# Patient Record
Sex: Female | Born: 1990
Health system: Southern US, Community
[De-identification: ages and names within clinical notes are randomized; demographics above are authoritative.]

## PROBLEM LIST (undated history)

## (undated) ENCOUNTER — Inpatient Hospital Stay (HOSPITAL_COMMUNITY): Payer: Self-pay

## (undated) DIAGNOSIS — Z21 Asymptomatic human immunodeficiency virus [HIV] infection status: Secondary | ICD-10-CM

## (undated) DIAGNOSIS — IMO0002 Reserved for concepts with insufficient information to code with codable children: Secondary | ICD-10-CM

## (undated) DIAGNOSIS — D6862 Lupus anticoagulant syndrome: Secondary | ICD-10-CM

## (undated) DIAGNOSIS — F329 Major depressive disorder, single episode, unspecified: Secondary | ICD-10-CM

## (undated) DIAGNOSIS — F909 Attention-deficit hyperactivity disorder, unspecified type: Secondary | ICD-10-CM

## (undated) DIAGNOSIS — R0602 Shortness of breath: Secondary | ICD-10-CM

## (undated) DIAGNOSIS — Z7901 Long term (current) use of anticoagulants: Secondary | ICD-10-CM

## (undated) DIAGNOSIS — R05 Cough: Secondary | ICD-10-CM

## (undated) DIAGNOSIS — L240 Irritant contact dermatitis due to detergents: Secondary | ICD-10-CM

## (undated) DIAGNOSIS — F419 Anxiety disorder, unspecified: Secondary | ICD-10-CM

## (undated) DIAGNOSIS — F32A Depression, unspecified: Secondary | ICD-10-CM

## (undated) DIAGNOSIS — Z720 Tobacco use: Secondary | ICD-10-CM

## (undated) DIAGNOSIS — R87619 Unspecified abnormal cytological findings in specimens from cervix uteri: Secondary | ICD-10-CM

## (undated) DIAGNOSIS — F319 Bipolar disorder, unspecified: Secondary | ICD-10-CM

## (undated) DIAGNOSIS — D6861 Antiphospholipid syndrome: Secondary | ICD-10-CM

## (undated) DIAGNOSIS — R058 Other specified cough: Secondary | ICD-10-CM

## (undated) DIAGNOSIS — F1910 Other psychoactive substance abuse, uncomplicated: Secondary | ICD-10-CM

## (undated) DIAGNOSIS — D649 Anemia, unspecified: Secondary | ICD-10-CM

## (undated) DIAGNOSIS — K219 Gastro-esophageal reflux disease without esophagitis: Secondary | ICD-10-CM

## (undated) DIAGNOSIS — I2699 Other pulmonary embolism without acute cor pulmonale: Secondary | ICD-10-CM

## (undated) DIAGNOSIS — O1200 Gestational edema, unspecified trimester: Secondary | ICD-10-CM

## (undated) DIAGNOSIS — J069 Acute upper respiratory infection, unspecified: Secondary | ICD-10-CM

## (undated) DIAGNOSIS — F410 Panic disorder [episodic paroxysmal anxiety] without agoraphobia: Secondary | ICD-10-CM

## (undated) HISTORY — DX: Tobacco use: Z72.0

## (undated) HISTORY — DX: Long term (current) use of anticoagulants: Z79.01

## (undated) HISTORY — DX: Other psychoactive substance abuse, uncomplicated: F19.10

## (undated) HISTORY — DX: Anemia, unspecified: D64.9

## (undated) HISTORY — DX: Anxiety disorder, unspecified: F41.9

## (undated) HISTORY — DX: Attention-deficit hyperactivity disorder, unspecified type: F90.9

## (undated) HISTORY — DX: Depression, unspecified: F32.A

## (undated) HISTORY — DX: Asymptomatic human immunodeficiency virus (hiv) infection status: Z21

## (undated) HISTORY — DX: Major depressive disorder, single episode, unspecified: F32.9

## (undated) HISTORY — DX: Antiphospholipid syndrome: D68.61

## (undated) HISTORY — DX: Other pulmonary embolism without acute cor pulmonale: I26.99

## (undated) HISTORY — DX: Lupus anticoagulant syndrome: D68.62

---
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
## 2008-01-07 ENCOUNTER — Emergency Department (HOSPITAL_COMMUNITY): Admission: EM | Admit: 2008-01-07 | Discharge: 2008-01-07 | Payer: Self-pay | Admitting: Emergency Medicine

## 2008-01-08 ENCOUNTER — Emergency Department (HOSPITAL_COMMUNITY): Admission: EM | Admit: 2008-01-08 | Discharge: 2008-01-08 | Payer: Self-pay | Admitting: Emergency Medicine

## 2008-01-16 ENCOUNTER — Emergency Department (HOSPITAL_COMMUNITY): Admission: EM | Admit: 2008-01-16 | Discharge: 2008-01-16 | Payer: Self-pay | Admitting: Emergency Medicine

## 2008-07-26 ENCOUNTER — Emergency Department (HOSPITAL_COMMUNITY): Admission: EM | Admit: 2008-07-26 | Discharge: 2008-07-26 | Payer: Self-pay | Admitting: Emergency Medicine

## 2008-08-29 ENCOUNTER — Emergency Department (HOSPITAL_COMMUNITY): Admission: EM | Admit: 2008-08-29 | Discharge: 2008-08-29 | Payer: Self-pay | Admitting: Emergency Medicine

## 2008-09-17 ENCOUNTER — Inpatient Hospital Stay (HOSPITAL_COMMUNITY): Admission: AD | Admit: 2008-09-17 | Discharge: 2008-09-22 | Payer: Self-pay | Admitting: Family Medicine

## 2008-09-17 ENCOUNTER — Other Ambulatory Visit: Payer: Self-pay | Admitting: Emergency Medicine

## 2008-09-17 ENCOUNTER — Ambulatory Visit: Payer: Self-pay | Admitting: Obstetrics & Gynecology

## 2009-02-08 ENCOUNTER — Emergency Department (HOSPITAL_COMMUNITY): Admission: EM | Admit: 2009-02-08 | Discharge: 2009-02-08 | Payer: Self-pay | Admitting: Emergency Medicine

## 2009-02-16 ENCOUNTER — Emergency Department (HOSPITAL_COMMUNITY): Admission: EM | Admit: 2009-02-16 | Discharge: 2009-02-16 | Payer: Self-pay | Admitting: Emergency Medicine

## 2009-04-12 ENCOUNTER — Emergency Department (HOSPITAL_COMMUNITY): Admission: EM | Admit: 2009-04-12 | Discharge: 2009-04-12 | Payer: Self-pay | Admitting: Emergency Medicine

## 2009-04-29 ENCOUNTER — Emergency Department (HOSPITAL_COMMUNITY): Admission: EM | Admit: 2009-04-29 | Discharge: 2009-04-30 | Payer: Self-pay | Admitting: Emergency Medicine

## 2009-05-10 ENCOUNTER — Emergency Department (HOSPITAL_COMMUNITY): Admission: EM | Admit: 2009-05-10 | Discharge: 2009-05-10 | Payer: Self-pay | Admitting: Emergency Medicine

## 2009-05-18 IMAGING — US US OB COMP +14 WK
1 series · 14 of 28 positions shown · non-contrast
Comparison: none

OBSTETRICAL ULTRASOUND:
 This ultrasound exam was performed in the [HOSPITAL] Ultrasound Department.  The OB US report was generated in the AS system, and faxed to the ordering physician.  This report is also available in [REDACTED] PACS.

[Series 1: us ob comp +14 wk · 39 acquisitions, 14 frames shown]
[im 2/39]
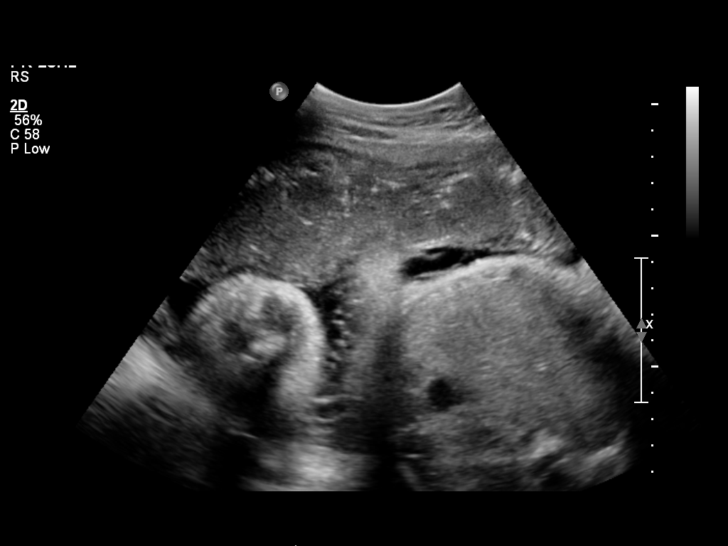
[im 5/39]
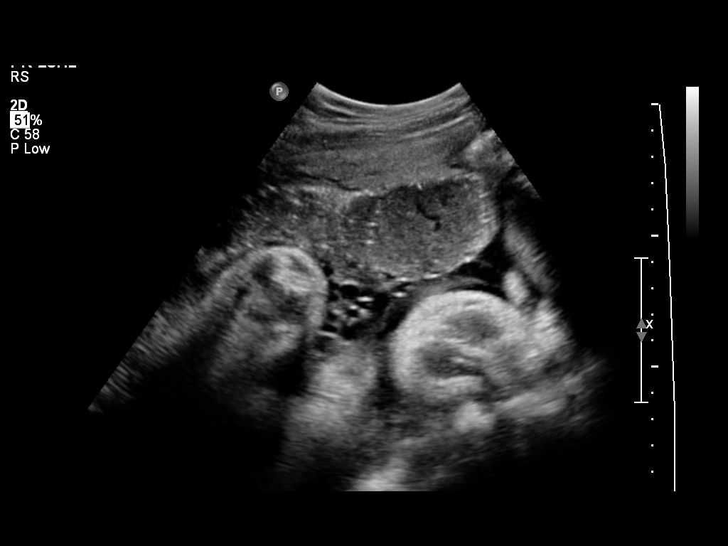
[im 8/39]
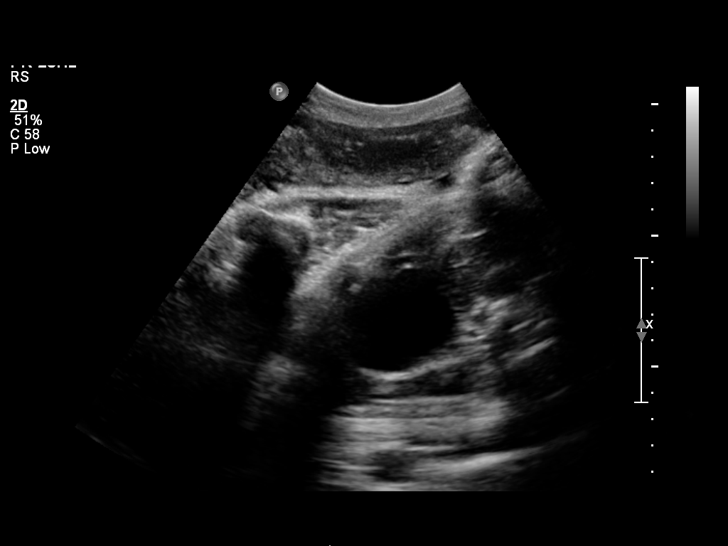
[im 10/39]
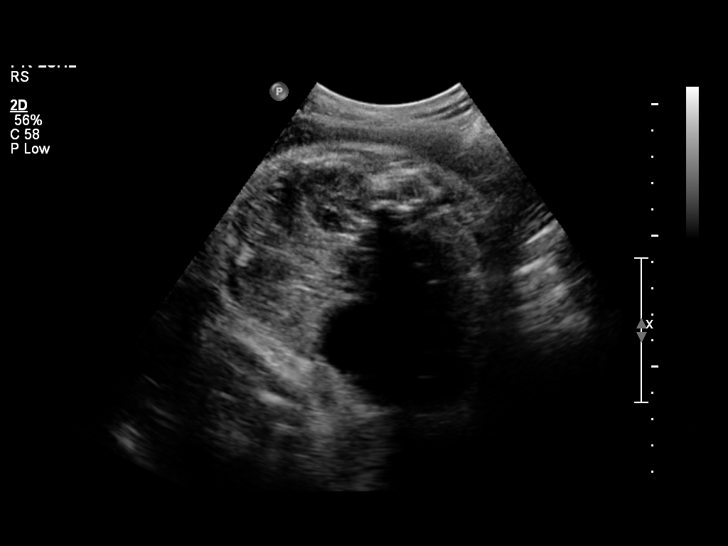
[im 13/39]
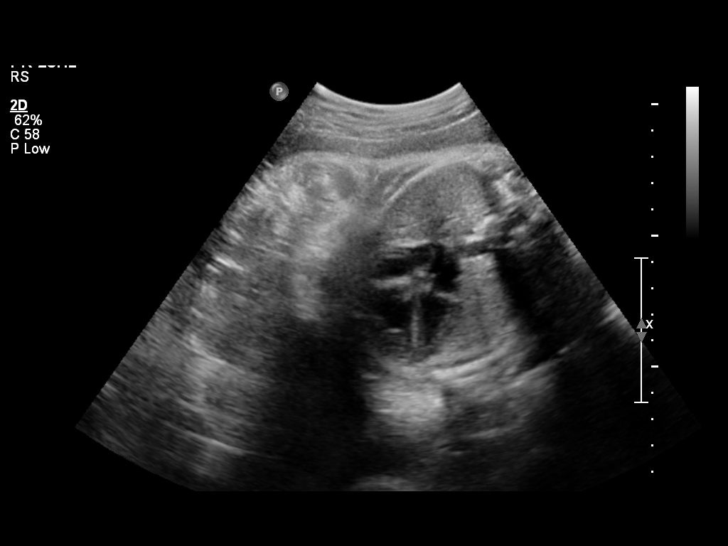
[im 16/39]
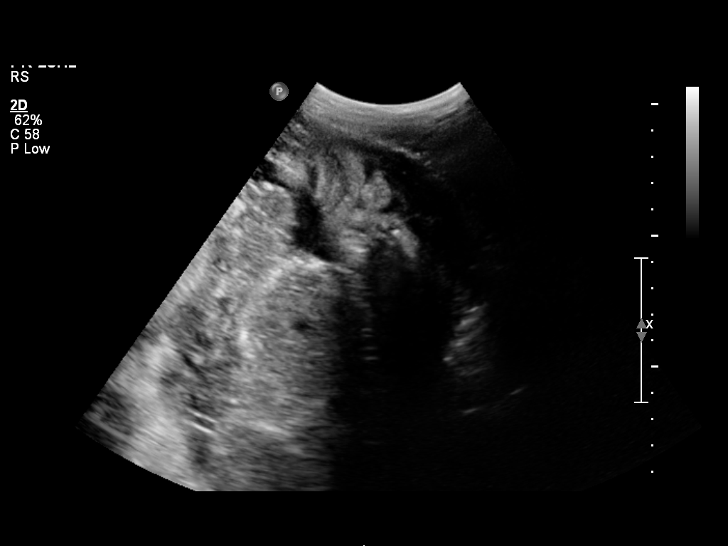
[im 19/39]
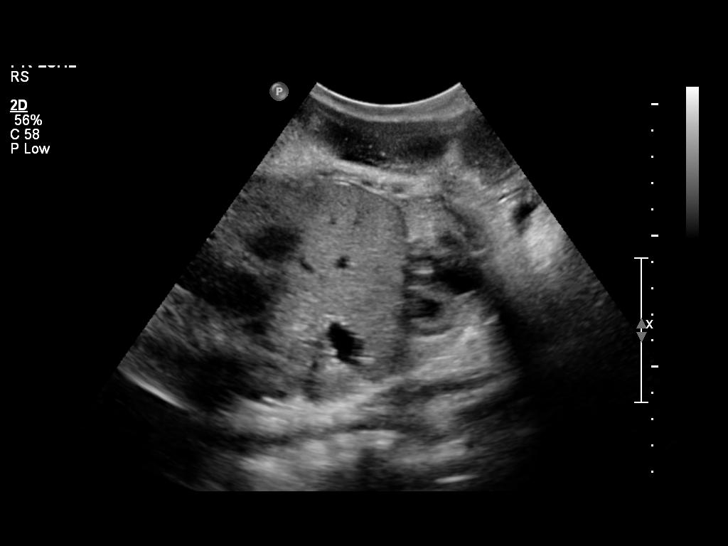
[im 22/39]
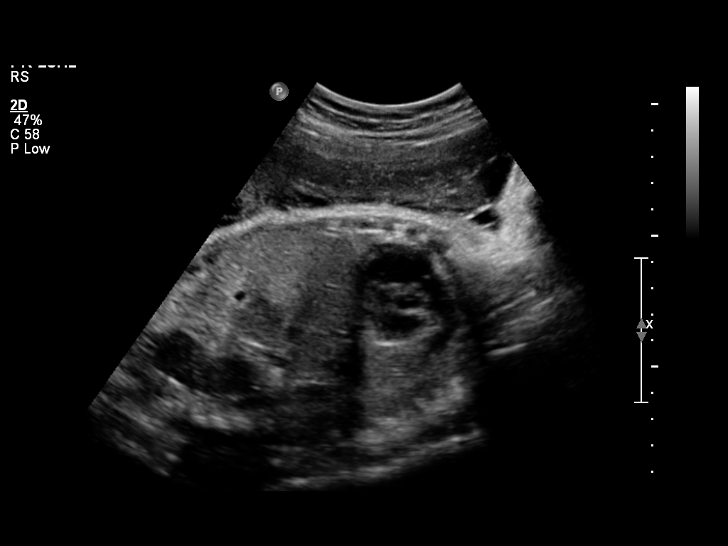
[im 24/39]
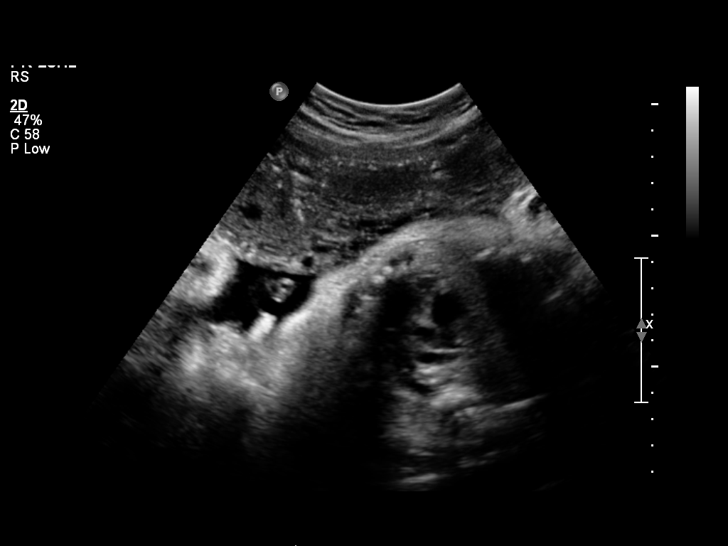
[im 27/39]
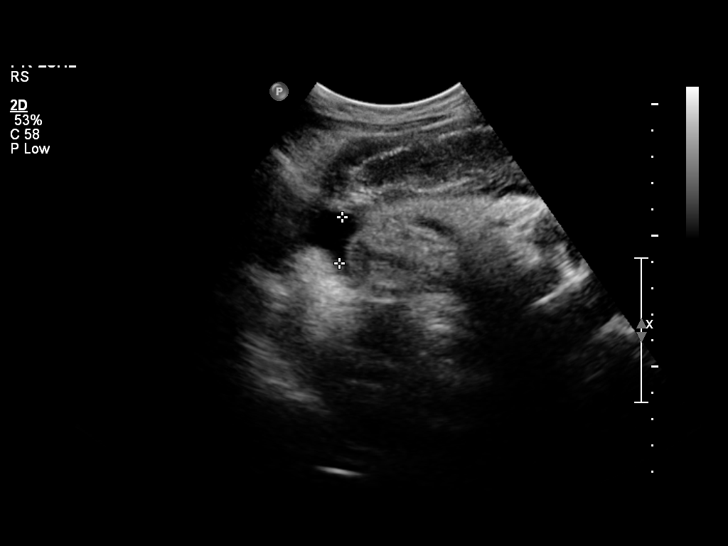
[im 30/39]
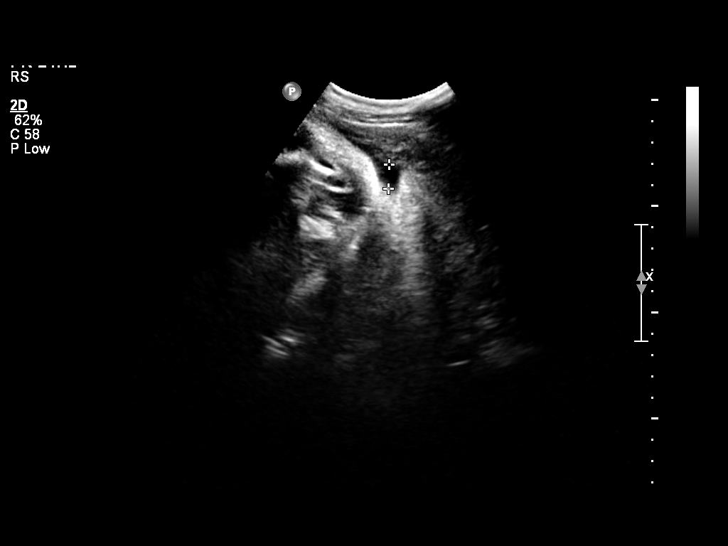
[im 33/39]
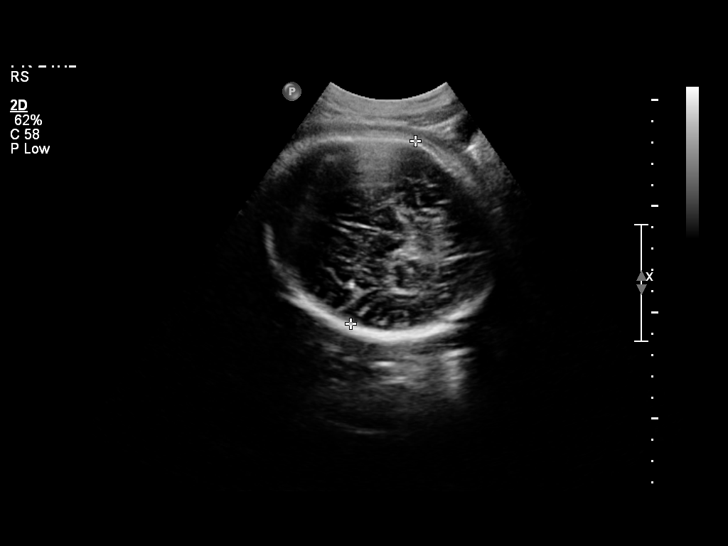
[im 36/39]
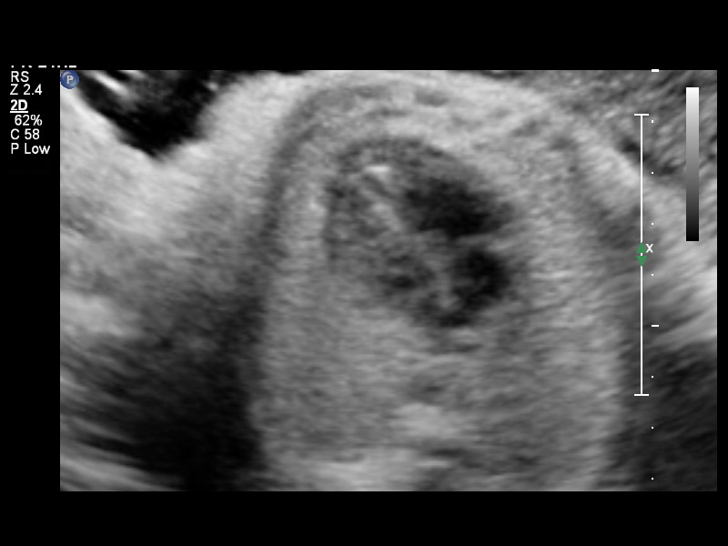
[im 39/39]
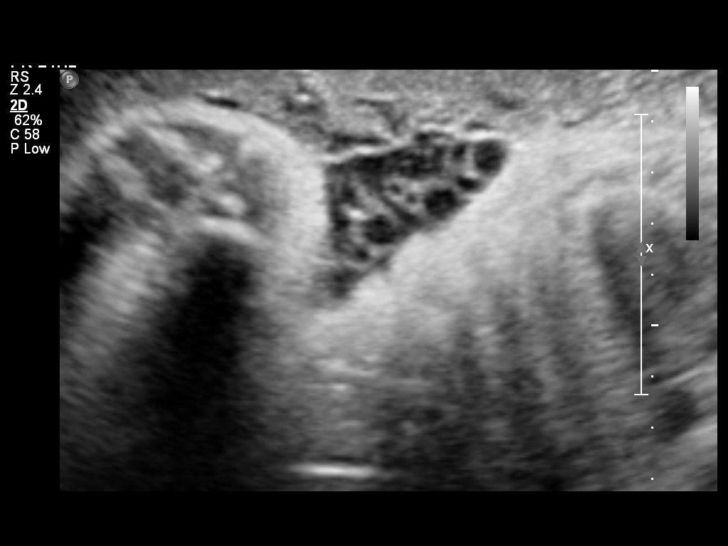

[14 of 28 positions shown; findings below may reference images not displayed]

IMPRESSION: See AS Obstetric US report.

## 2009-11-14 ENCOUNTER — Emergency Department (HOSPITAL_COMMUNITY): Admission: EM | Admit: 2009-11-14 | Discharge: 2009-11-15 | Payer: Self-pay | Admitting: Emergency Medicine

## 2010-01-12 ENCOUNTER — Emergency Department (HOSPITAL_COMMUNITY): Admission: EM | Admit: 2010-01-12 | Discharge: 2010-01-12 | Payer: Self-pay | Admitting: Emergency Medicine

## 2010-04-26 ENCOUNTER — Inpatient Hospital Stay (HOSPITAL_COMMUNITY): Admission: AD | Admit: 2010-04-26 | Discharge: 2010-04-29 | Payer: Self-pay | Admitting: Obstetrics & Gynecology

## 2010-04-26 ENCOUNTER — Ambulatory Visit: Payer: Self-pay | Admitting: Obstetrics & Gynecology

## 2010-06-27 DIAGNOSIS — I2699 Other pulmonary embolism without acute cor pulmonale: Secondary | ICD-10-CM

## 2010-06-27 HISTORY — DX: Other pulmonary embolism without acute cor pulmonale: I26.99

## 2010-06-28 ENCOUNTER — Inpatient Hospital Stay (HOSPITAL_COMMUNITY): Admission: EM | Admit: 2010-06-28 | Discharge: 2010-07-02 | Payer: Self-pay | Admitting: Internal Medicine

## 2010-06-30 ENCOUNTER — Encounter (INDEPENDENT_AMBULATORY_CARE_PROVIDER_SITE_OTHER): Payer: Self-pay | Admitting: Internal Medicine

## 2010-06-30 ENCOUNTER — Ambulatory Visit: Payer: Self-pay | Admitting: Surgery

## 2010-07-02 ENCOUNTER — Emergency Department (HOSPITAL_COMMUNITY): Admission: EM | Admit: 2010-07-02 | Discharge: 2010-07-03 | Payer: Self-pay | Admitting: Emergency Medicine

## 2010-07-07 ENCOUNTER — Inpatient Hospital Stay (HOSPITAL_COMMUNITY): Admission: EM | Admit: 2010-07-07 | Discharge: 2010-07-15 | Payer: Self-pay | Admitting: Emergency Medicine

## 2010-07-09 ENCOUNTER — Ambulatory Visit: Payer: Self-pay | Admitting: Oncology

## 2010-07-10 ENCOUNTER — Encounter (INDEPENDENT_AMBULATORY_CARE_PROVIDER_SITE_OTHER): Payer: Self-pay

## 2010-07-22 ENCOUNTER — Emergency Department (HOSPITAL_COMMUNITY): Admission: EM | Admit: 2010-07-22 | Discharge: 2010-07-22 | Payer: Self-pay | Admitting: Emergency Medicine

## 2010-08-11 ENCOUNTER — Emergency Department (HOSPITAL_COMMUNITY): Admission: EM | Admit: 2010-08-11 | Discharge: 2010-08-11 | Payer: Self-pay | Admitting: Emergency Medicine

## 2010-08-15 ENCOUNTER — Encounter (HOSPITAL_COMMUNITY): Admission: RE | Admit: 2010-08-15 | Discharge: 2010-09-14 | Payer: Self-pay | Admitting: Oncology

## 2010-08-15 ENCOUNTER — Ambulatory Visit (HOSPITAL_COMMUNITY): Payer: Self-pay | Admitting: Oncology

## 2010-09-06 ENCOUNTER — Emergency Department (HOSPITAL_COMMUNITY): Admission: EM | Admit: 2010-09-06 | Discharge: 2010-09-06 | Payer: Self-pay | Admitting: Emergency Medicine

## 2010-09-17 ENCOUNTER — Encounter (HOSPITAL_COMMUNITY): Admission: RE | Admit: 2010-09-17 | Discharge: 2010-09-26 | Payer: Self-pay | Admitting: Oncology

## 2010-09-26 ENCOUNTER — Ambulatory Visit (HOSPITAL_COMMUNITY): Payer: Self-pay | Admitting: Oncology

## 2010-09-26 ENCOUNTER — Encounter (HOSPITAL_COMMUNITY): Admission: RE | Admit: 2010-09-26 | Discharge: 2010-09-26 | Payer: Self-pay | Admitting: Oncology

## 2010-10-03 ENCOUNTER — Encounter (HOSPITAL_COMMUNITY)
Admission: RE | Admit: 2010-10-03 | Discharge: 2010-11-02 | Payer: Self-pay | Source: Home / Self Care | Admitting: Oncology

## 2010-10-03 ENCOUNTER — Ambulatory Visit (HOSPITAL_COMMUNITY): Payer: Self-pay | Admitting: Oncology

## 2010-11-07 ENCOUNTER — Encounter (HOSPITAL_COMMUNITY)
Admission: RE | Admit: 2010-11-07 | Discharge: 2010-12-07 | Payer: Self-pay | Source: Home / Self Care | Attending: Oncology | Admitting: Oncology

## 2010-11-27 ENCOUNTER — Ambulatory Visit (HOSPITAL_COMMUNITY): Payer: Self-pay | Admitting: Oncology

## 2010-11-28 ENCOUNTER — Emergency Department (HOSPITAL_COMMUNITY)
Admission: EM | Admit: 2010-11-28 | Discharge: 2010-11-28 | Payer: Self-pay | Source: Home / Self Care | Admitting: Emergency Medicine

## 2010-12-08 ENCOUNTER — Encounter (HOSPITAL_COMMUNITY)
Admission: RE | Admit: 2010-12-08 | Discharge: 2011-01-07 | Payer: Self-pay | Source: Home / Self Care | Attending: Oncology | Admitting: Oncology

## 2011-01-13 ENCOUNTER — Emergency Department (HOSPITAL_COMMUNITY)
Admission: EM | Admit: 2011-01-13 | Discharge: 2011-01-13 | Payer: Self-pay | Source: Home / Self Care | Admitting: Emergency Medicine

## 2011-01-14 ENCOUNTER — Ambulatory Visit
Admission: RE | Admit: 2011-01-14 | Discharge: 2011-01-14 | Payer: Self-pay | Source: Home / Self Care | Attending: Cardiology | Admitting: Cardiology

## 2011-01-14 LAB — CONVERTED CEMR LAB: POC INR: 1.2

## 2011-01-14 LAB — RAPID STREP SCREEN (MED CTR MEBANE ONLY): Streptococcus, Group A Screen (Direct): NEGATIVE

## 2011-01-19 ENCOUNTER — Ambulatory Visit: Admission: RE | Admit: 2011-01-19 | Discharge: 2011-01-19 | Payer: Self-pay | Source: Home / Self Care

## 2011-01-19 LAB — CONVERTED CEMR LAB: POC INR: 1.2

## 2011-01-23 ENCOUNTER — Telehealth (INDEPENDENT_AMBULATORY_CARE_PROVIDER_SITE_OTHER): Payer: Self-pay | Admitting: *Deleted

## 2011-01-26 ENCOUNTER — Ambulatory Visit: Admission: RE | Admit: 2011-01-26 | Discharge: 2011-01-26 | Payer: Self-pay | Source: Home / Self Care

## 2011-01-29 ENCOUNTER — Encounter: Payer: Self-pay | Admitting: Cardiology

## 2011-01-29 ENCOUNTER — Encounter (INDEPENDENT_AMBULATORY_CARE_PROVIDER_SITE_OTHER): Payer: Self-pay

## 2011-01-29 DIAGNOSIS — I2699 Other pulmonary embolism without acute cor pulmonale: Secondary | ICD-10-CM

## 2011-01-29 DIAGNOSIS — Z7901 Long term (current) use of anticoagulants: Secondary | ICD-10-CM

## 2011-01-29 NOTE — Progress Notes (Signed)
Summary: coumadin adjustment  Phone Note Call from Patient   Caller: Patient Reason for Call: Talk to Nurse Summary of Call: Called stating she could not make INR appt but has rescheduled for Monday 01/26/10.  Told pt to take couamdin 15mg  on 1/126/12 then 7.5mg  once daily untill recheck on 01/26/11. Initial call taken by: Vashti Hey RN,  January 23, 2011 2:24 PM

## 2011-01-29 NOTE — Medication Information (Signed)
Summary: **ccn post partum,lupus  Anticoagulant Therapy  Managed by: Vashti Hey, RN Supervising MD: Diona Browner MD, Remi Deter Indication 1: Pulmonary Embolism Indication 2: Lupus Anticoagulant  Lab Used: [facility] [place] Site: [place] INR POC 1.2  Dietary changes: no    Health status changes: no    Bleeding/hemorrhagic complications: no    Recent/future hospitalizations: no    Any changes in medication regimen? no    Recent/future dental: no  Any missed doses?: no       Is patient compliant with meds? yes       Anticoagulation Management History:      The patient comes in today for her initial visit for anticoagulation therapy.  Negative risk factors for bleeding include an age less than 16 years old.  The bleeding index is 'low risk'.  Negative CHADS2 values include Age > 51 years old.  Anticoagulation responsible provider: Diona Browner MD, Remi Deter.  INR POC: 1.2.  Cuvette Lot#: [id number].    Anticoagulation Management Assessment/Plan:      The target INR is 2.0-3.0.  The next INR is due 01/19/2011.  Anticoagulation instructions were given to patient.  Results were reviewed/authorized by Vashti Hey, RN.  She was notified by Vashti Hey RN.        Coagulation management information includes: Previously followed by Dr Felecia Shelling and Dr Mariel Sleet .  Current Anticoagulation Instructions: INR 1.2 Take coumadin 10mg  once daily until next INR check on 01/19/11  Appended Document: **ccn post partum,lupus Lupus anticoagulant V-code  289.81

## 2011-01-29 NOTE — Medication Information (Signed)
Summary: ccr-lr  Anticoagulant Therapy  Managed by: Vashti Hey, RN Supervising MD: Dietrich Pates MD, Molly Maduro Indication 1: Pulmonary Embolism Indication 2: Lupus Anticoagulant  Lab Used: [facility] [place] Site: [place] INR POC 1.2  Dietary changes: no    Health status changes: no    Bleeding/hemorrhagic complications: no    Recent/future hospitalizations: no    Any changes in medication regimen? no    Recent/future dental: no  Any missed doses?: yes     Details: missed 1 dose sat night  Is patient compliant with meds? yes       Anticoagulation Management History:      The patient is taking warfarin and comes in today for a routine follow up visit.  Negative risk factors for bleeding include an age less than 77 years old.  The bleeding index is 'low risk'.  Negative CHADS2 values include Age > 25 years old.  Anticoagulation responsible provider: Dietrich Pates MD, Molly Maduro.  INR POC: 1.2.  Cuvette Lot#: [id number].    Anticoagulation Management Assessment/Plan:      The patient's current anticoagulation dose is Warfarin sodium 10 mg tabs: Take as directed by coumadin clinic.  The target INR is 2.0-3.0.  The next INR is due 01/22/2011.  Anticoagulation instructions were given to patient.  Results were reviewed/authorized by Vashti Hey, RN.  She was notified by Vashti Hey RN.         Prior Anticoagulation Instructions: INR 1.2 Take coumadin 10mg  once daily until next INR check on 01/19/11  Current Anticoagulation Instructions: INR 1.2 Increase coumadin to 15mg  once daily  Prescriptions: WARFARIN SODIUM 10 MG TABS (WARFARIN SODIUM) Take as directed by coumadin clinic  #60 x 1   Entered by:   Vashti Hey RN   Authorized by:   Kathlen Brunswick, MD, [facility]   Signed by:   Vashti Hey RN on 01/19/2011   Method used:   Electronically to        [facility] (retail)       [address]       [place], [place]  [address]       Ph: [phone]  Fax: [phone]   RxID:   [id number]

## 2011-01-31 ENCOUNTER — Emergency Department (HOSPITAL_COMMUNITY): Payer: Self-pay

## 2011-01-31 ENCOUNTER — Inpatient Hospital Stay (HOSPITAL_COMMUNITY)
Admission: EM | Admit: 2011-01-31 | Discharge: 2011-02-04 | DRG: 202 | Disposition: A | Discharge status: Home / Self Care | Payer: Self-pay | Attending: Internal Medicine | Admitting: Internal Medicine

## 2011-01-31 DIAGNOSIS — J45901 Unspecified asthma with (acute) exacerbation: Principal | ICD-10-CM | POA: Diagnosis present

## 2011-01-31 DIAGNOSIS — Z86718 Personal history of other venous thrombosis and embolism: Secondary | ICD-10-CM

## 2011-01-31 DIAGNOSIS — Z7901 Long term (current) use of anticoagulants: Secondary | ICD-10-CM

## 2011-01-31 DIAGNOSIS — D6859 Other primary thrombophilia: Secondary | ICD-10-CM | POA: Diagnosis present

## 2011-02-01 ENCOUNTER — Encounter: Payer: Self-pay | Admitting: Cardiology

## 2011-02-01 LAB — DIFFERENTIAL
Basophils Absolute: 0.1 10*3/uL (ref 0.0–0.1)
Basophils Relative: 0 % (ref 0–1)
Eosinophils Relative: 2 % (ref 0–5)
Lymphocytes Relative: 11 % — ABNORMAL LOW (ref 12–46)
Lymphs Abs: 1.4 10*3/uL (ref 0.7–4.0)
Monocytes Absolute: 0.8 10*3/uL (ref 0.1–1.0)
Monocytes Relative: 6 % (ref 3–12)

## 2011-02-01 LAB — BASIC METABOLIC PANEL
BUN: 6 mg/dL (ref 6–23)
GFR calc Af Amer: >60 mL/min (ref >60)
Potassium: 3.6 mEq/L (ref 3.5–5.1)
Sodium: 133 mEq/L — ABNORMAL LOW (ref 135–145)

## 2011-02-01 LAB — BLOOD GAS, ARTERIAL
Bicarbonate: 22.6 mEq/L (ref 20.0–24.0)
Patient temperature: 37

## 2011-02-01 LAB — CBC
HCT: 36.2 % (ref 36.0–46.0)
Hemoglobin: 12.3 g/dL (ref 12.0–15.0)
MCH: 29.1 pg (ref 26.0–34.0)
WBC: 12.6 10*3/uL — ABNORMAL HIGH (ref 4.0–10.5)

## 2011-02-01 LAB — PROTIME-INR: Prothrombin Time: 28.2 seconds — ABNORMAL HIGH (ref 11.6–15.2)

## 2011-02-01 LAB — CONVERTED CEMR LAB
Calcium: 8.7 mg/dL
Creatinine, Ser: 0.91 mg/dL

## 2011-02-02 LAB — URINALYSIS, ROUTINE W REFLEX MICROSCOPIC
Ketones, ur: NEGATIVE mg/dL
Leukocytes, UA: NEGATIVE
Nitrite: NEGATIVE
Protein, ur: NEGATIVE mg/dL
Urine Glucose, Fasting: 250 mg/dL — AB
pH: 6 (ref 5.0–8.0)

## 2011-02-02 LAB — URINE MICROSCOPIC-ADD ON

## 2011-02-02 LAB — PROTIME-INR
INR: 2.79 — ABNORMAL HIGH (ref 0.00–1.49)
Prothrombin Time: 29.5 seconds — ABNORMAL HIGH (ref 11.6–15.2)

## 2011-02-03 LAB — PROTIME-INR: Prothrombin Time: 31.8 seconds — ABNORMAL HIGH (ref 11.6–15.2)

## 2011-02-04 LAB — PROTIME-INR: INR: 2.75 — ABNORMAL HIGH (ref 0.00–1.49)

## 2011-02-04 NOTE — Medication Information (Signed)
Summary: ccr-lr  Anticoagulant Therapy  Managed by: Vashti Hey, RN Supervising MD: Dietrich Pates MD, Molly Maduro Indication 1: Pulmonary Embolism Indication 2: Lupus Anticoagulant  Lab Used: [facility] [place] Site: [place] INR POC 2.4  Dietary changes: no    Health status changes: no    Bleeding/hemorrhagic complications: no    Recent/future hospitalizations: no    Any changes in medication regimen? no    Recent/future dental: no  Any missed doses?: no       Is patient compliant with meds? yes       Anticoagulation Management History:      The patient is taking warfarin and comes in today for a routine follow up visit.  Negative risk factors for bleeding include an age less than 22 years old.  The bleeding index is 'low risk'.  Negative CHADS2 values include Age > 22 years old.  Anticoagulation responsible provider: Dietrich Pates MD, Molly Maduro.  INR POC: 2.4.  Cuvette Lot#: [id number].    Anticoagulation Management Assessment/Plan:      The patient's current anticoagulation dose is Warfarin sodium 10 mg tabs: Take as directed by coumadin clinic.  The target INR is 2.0-3.0.  The next INR is due 02/02/2011.  Anticoagulation instructions were given to patient.  Results were reviewed/authorized by Vashti Hey, RN.  She was notified by Vashti Hey RN.         Prior Anticoagulation Instructions: INR 1.1 Take coumadin 20mg  once daily until INR check on 01/29/11  Current Anticoagulation Instructions: INR 2.4 Take coumadin 15mg  once daily except 20mg  on Saturday

## 2011-02-04 NOTE — Medication Information (Signed)
Summary: ccr-lr  Anticoagulant Therapy  Managed by: Vashti Hey, RN Supervising MD: Dietrich Pates MD, Molly Maduro Indication 1: Pulmonary Embolism Indication 2: Lupus Anticoagulant  Lab Used: [facility] [place] Site: [place] INR POC 1.1  Dietary changes: no    Health status changes: no    Bleeding/hemorrhagic complications: no    Recent/future hospitalizations: no    Any changes in medication regimen? no    Recent/future dental: no  Any missed doses?: no       Is patient compliant with meds? yes       Anticoagulation Management History:      The patient is taking warfarin and comes in today for a routine follow up visit.  Negative risk factors for bleeding include an age less than 73 years old.  The bleeding index is 'low risk'.  Negative CHADS2 values include Age > 22 years old.  Anticoagulation responsible provider: Dietrich Pates MD, Molly Maduro.  INR POC: 1.1.  Cuvette Lot#: [id number].    Anticoagulation Management Assessment/Plan:      The patient's current anticoagulation dose is Warfarin sodium 10 mg tabs: Take as directed by coumadin clinic.  The target INR is 2.0-3.0.  The next INR is due 01/29/2011.  Anticoagulation instructions were given to patient.  Results were reviewed/authorized by Vashti Hey, RN.  She was notified by Vashti Hey RN.         Prior Anticoagulation Instructions: INR 1.2 Increase coumadin to 15mg  once daily   Current Anticoagulation Instructions: INR 1.1 Take coumadin 20mg  once daily until INR check on 01/29/11

## 2011-02-05 ENCOUNTER — Encounter (INDEPENDENT_AMBULATORY_CARE_PROVIDER_SITE_OTHER): Payer: Self-pay | Admitting: *Deleted

## 2011-02-09 ENCOUNTER — Encounter (INDEPENDENT_AMBULATORY_CARE_PROVIDER_SITE_OTHER): Payer: Self-pay | Admitting: *Deleted

## 2011-02-09 ENCOUNTER — Ambulatory Visit: Payer: Self-pay | Admitting: Cardiology

## 2011-02-09 ENCOUNTER — Encounter: Payer: Self-pay | Admitting: Cardiology

## 2011-02-09 DIAGNOSIS — F329 Major depressive disorder, single episode, unspecified: Secondary | ICD-10-CM | POA: Insufficient documentation

## 2011-02-09 DIAGNOSIS — D649 Anemia, unspecified: Secondary | ICD-10-CM | POA: Insufficient documentation

## 2011-02-09 DIAGNOSIS — J45909 Unspecified asthma, uncomplicated: Secondary | ICD-10-CM | POA: Insufficient documentation

## 2011-02-09 DIAGNOSIS — I2699 Other pulmonary embolism without acute cor pulmonale: Secondary | ICD-10-CM | POA: Insufficient documentation

## 2011-02-09 DIAGNOSIS — F17200 Nicotine dependence, unspecified, uncomplicated: Secondary | ICD-10-CM | POA: Insufficient documentation

## 2011-02-09 DIAGNOSIS — F411 Generalized anxiety disorder: Secondary | ICD-10-CM | POA: Insufficient documentation

## 2011-02-11 NOTE — Discharge Summary (Signed)
  [name], [name]                ACCOUNT NO.:  [id number]  MEDICAL RECORD NO.:  [id number]           PATIENT TYPE:  I  LOCATION:  A320                          FACILITY:  [facility]  PHYSICIAN:  Tilmon Wisehart D. Felecia Shelling, MD   DATE OF BIRTH:  1991/08/20  DATE OF ADMISSION:  01/31/2011 DATE OF DISCHARGE:  02/08/2012LH                              DISCHARGE SUMMARY   DISCHARGE DIAGNOSES: 1. Bronchial asthma with exacerbation. 2. History of bilateral pulmonary emboli. 3. Positive lupus anticoagulant.  DISCHARGE MEDICATIONS: 1. Coumadin 5 mg p.o. daily. 2. Albuterol inhaler q.4 h. as needed. 3. Prednisone 40 mg p.o. daily for 3 days, then 20 mg p.o. daily for 3     days, then 10 mg p.o. daily for 3 days. 4. Ferrous sulfate 1 tablet p.o. daily. 5. Folic acid 1 tablet p.o. daily. 6. Lortab 5/500 one tablet p.o. q.6 h. p.r.n.  DISPOSITION:  The patient will be discharged home in stable condition.  DISCHARGE INSTRUCTIONS:  The patient is strongly advised to continue follow up with Coumadin clinic to monitor her PT/INR.  The patient should be on oral steroid with a tapering dose.  She is to be followed in the office in 1 week.  LABORATORY FINDINGS ON DISCHARGE:  PT is 29.2, INR 2.7.  HOSPITAL COURSE:  This is a 20 year old female patient with a history of positive lupus anticoagulant and a previous bilateral pulmonary emboli and bronchial asthma, came to emergency room due to shortness of breath. She had acute exacerbation of bronchial asthma.  The patient was out of her nebulizer treatments.  She was admitted and was started on IV steroid and IV antibiotics.  The patient also received nebulizer treatment and oxygen therapy. Over the hospital stay, the patient gradually improved.  Her PT/INR was followed which was therapeutic throughout the hospital course.  The patient will be discharged to home on oral steroids.  She will continue her anticoagulation.  The patient has been strongly advised  to follow with Coumadin Clinic to get her PT/INR monitored.     Cloie Wooden D. Felecia Shelling, MD     TDF/MEDQ  D:  02/04/2011  T:  02/04/2011  Job:  [id number]  Electronically Signed by Avon Gully MD on 02/11/2011 08:19:01 AM

## 2011-02-11 NOTE — H&P (Signed)
[name], STATE                ACCOUNT NO.:  [id number]  MEDICAL RECORD NO.:  [id number]           PATIENT TYPE:  I  LOCATION:  A320                          FACILITY:  [facility]  PHYSICIAN:  Joahan Swatzell D. Felecia Shelling, MD   DATE OF BIRTH:  09-26-1991  DATE OF ADMISSION:  02/01/2011 DATE OF DISCHARGE:  LH                             HISTORY & PHYSICAL   CHIEF COMPLAINT:  Shortness of breath.  HISTORY OF PRESENT ILLNESS:  This is a 20 years old female patient with history of multiple medical illnesses who came to emergency room due to cough, shortness of breath, and wheezing.  The patient has a known case of bronchial asthma, who has been on nebulizer treatment.  The patient claims she ran out of her nebulizer treatment recently.  She was evaluated in the emergency room and was given several breathing treatments and IV steroids.  However, her symptoms continued to persist. Her oxygen saturation on room air was below 90.  The patient was then started on IV fluid, IV steroids, and nebulizer treatments and she is admitted for further management.  REVIEW OF SYSTEMS:  The patient has no fever, chills, chest pain, nausea, vomiting, abdominal pain, dysuria, urgency, or frequency of urination.  PAST MEDICAL HISTORY: 1. Bronchial asthma. 2. History of pulmonary emboli. 3. History of positive lupus anticoagulant. 4. Chronic anemia. 5. Hypokalemia. 6. Anxiety and depression disorder. 7. History of pleuritic chest pain.  CURRENT MEDICATIONS:  The patient is on nebulizer treatment and Coumadin according to PT/INR, which is being monitored in Coumadin Clinic.  SOCIAL HISTORY:  The patient has a history of alcohol, tobacco, and substance abuse in the past.  She lives with her boyfriend.  FAMILY HISTORY:  Both her mom and dad are living.  Her mom has history of alcohol and substance abuse.  PHYSICAL EXAMINATION:  GENERAL:  The patient is alert, awake, and acutely sick looking. VITAL SIGNS:   Blood pressure 125/69, pulse 106, respiratory rate 16, temperature 97.5 degrees Fahrenheit. HEENT:  Pupils are equal and reactive. NECK:  Supple. CHEST:  Poor air entry, bilaterally expiratory wheezes and rhonchi. CARDIOVASCULAR:  First and second heart sound heard.  No murmur, no gallop. ABDOMEN:  Soft and lax.  Bowel sounds positive.  No mass or organomegaly. EXTREMITIES:  No leg edema.  LABS ON ADMISSION:  CBC, WBC 12.6, hemoglobin 12.3, hematocrit 36.2, platelets 195.  BMP, sodium 133, potassium 3.3, chloride 101, carbon dioxide 23, glucose 124, BUN 6, creatinine 0.9, calcium 8.7.  ABG at 2 L, pH 7.435, pCO2 34, pO2 78.7, bicarb of 22, and oxygen 96.  ASSESSMENT: 1. Acute exacerbation of bronchial asthma. 2. History of pulmonary embolism. 3. Positive lupus anticoagulant.  PLAN:  We will continue the patient on nebulizer treatments q.4 h. while she is awake.  Continue IV steroids.  We will continue oxygen as needed. Continue her anticoagulation according to pharmacy to adjust her dose of Coumadin.     Hazely Sealey D. Felecia Shelling, MD     TDF/MEDQ  D:  02/01/2011  T:  02/02/2011  Job:  [id number]  Electronically Signed by Avon Gully MD  on 02/11/2011 08:19:07 AM

## 2011-02-12 NOTE — Letter (Signed)
Summary: Appointment - Missed  [facility] [place]  [address], [place] [address]   Phone: [phone]  Fax: [phone]     February 05, 2011 MRN: [id number]   [facility] [address] [place], [place]  [address]   Dear Ms. Cerro,  Our records indicate you missed your appointment on    02/02/11                    with Coumadin clinic      .                                    It is very important that we reach you to reschedule this appointment. We look forward to participating in your health care needs. Please contact us at the number listed above at your earliest convenience to reschedule this appointment.     Sincerely,    Glass blower/designer

## 2011-02-16 ENCOUNTER — Ambulatory Visit: Payer: Self-pay | Admitting: Cardiology

## 2011-02-18 NOTE — Letter (Signed)
Summary: Appointment - Missed  [facility] [place]  [address] [place] [address]   Phone: [phone]  Fax: [phone]     February 09, 2011 MRN: [id number]   [facility] [address] [place], [place]  [address]   Dear [name],  Our records indicate you missed your appointment on      02/09/11                  with Dr.       .            ROTHBART                        It is very important that we reach you to reschedule this appointment. We look forward to participating in your health care needs. Please contact us at the number listed above at your earliest convenience to reschedule this appointment.   In order for our group to check your coumadin you have to be seen by one of our providers, please call us asap to make another appointment     Sincerely,    Architectural technologist Scheduling Team

## 2011-02-18 NOTE — Letter (Signed)
Summary: Appointment - Missed  [facility] [place]  [address], [place] [address]   Phone: [phone]  Fax: [phone]     February 09, 2011 MRN: [id number]   [facility] [address] [place], [place]  [address]   Dear [name],  Our records indicate you missed your appointment on                        with Dr.       .                                    It is very important that we reach you to reschedule this appointment. We look forward to participating in your health care needs. Please contact us at the number listed above at your earliest convenience to reschedule this appointment.     Sincerely,    Glass blower/designer

## 2011-02-23 ENCOUNTER — Encounter (INDEPENDENT_AMBULATORY_CARE_PROVIDER_SITE_OTHER): Payer: Self-pay | Admitting: *Deleted

## 2011-02-24 NOTE — Miscellaneous (Signed)
Summary: [facility]-late cancellation of new pt. evaluation   Allergies: No Known Drug Allergies  Past History:  Past Medical History: Pulmonary embolism: 06/2010; pleural effusion with right lower lobe infarction Anticoagulation Asthma Lupus anticoagulant; treated with Depo Provera Human immunodeficiency virus antibody positive but Western blot negative Anemia Substance abuse(cocaine, opiates, marijuana) Tobacco abuse Anxiety and depression    Past Surgical History: C-Section-2011  Family History: Father:age 56; h/o drug abuse; recent MI Mother:alive; age 46; history of alcohol and substance abuse

## 2011-02-25 ENCOUNTER — Ambulatory Visit: Payer: Self-pay | Admitting: Cardiology

## 2011-02-25 ENCOUNTER — Encounter (INDEPENDENT_AMBULATORY_CARE_PROVIDER_SITE_OTHER): Payer: Self-pay

## 2011-02-25 ENCOUNTER — Encounter: Payer: Self-pay | Admitting: Cardiology

## 2011-02-25 DIAGNOSIS — I2699 Other pulmonary embolism without acute cor pulmonale: Secondary | ICD-10-CM

## 2011-02-25 DIAGNOSIS — Z7901 Long term (current) use of anticoagulants: Secondary | ICD-10-CM

## 2011-02-25 LAB — CONVERTED CEMR LAB: POC INR: 1

## 2011-03-04 DIAGNOSIS — F1911 Other psychoactive substance abuse, in remission: Secondary | ICD-10-CM

## 2011-03-04 DIAGNOSIS — Z7901 Long term (current) use of anticoagulants: Secondary | ICD-10-CM

## 2011-03-05 NOTE — Medication Information (Addendum)
Summary: protime/tg  Anticoagulant Therapy  Managed by: Vashti Hey, RN PCP: Dr. Avon Gully Supervising MD: Dietrich Pates MD, Molly Maduro Indication 1: Pulmonary Embolism Indication 2: Lupus Anticoagulant  Lab Used: [facility] [place] Site: [place] INR POC 1.0  Dietary changes: no         Any missed doses?: yes     Details: has not taken coumadin in 3 days  Is patient compliant with meds? no     Details: does not take coumadin correctly   Allergies: No Known Drug Allergies  Anticoagulation Management History:      The patient is taking warfarin and comes in today for a routine follow up visit.  Negative risk factors for bleeding include an age less than 51 years old.  The bleeding index is 'low risk'.  Negative CHADS2 values include Age > 18 years old.  Anticoagulation responsible provider: Dietrich Pates MD, Molly Maduro.  INR POC: 1.0.  Cuvette Lot#: [id number].    Anticoagulation Management Assessment/Plan:      The patient's current anticoagulation dose is Warfarin sodium 10 mg tabs: Take as directed by coumadin clinic.  The target INR is 2.0-3.0.  The next INR is due 03/11/2011.  Anticoagulation instructions were given to patient.  Results were reviewed/authorized by Vashti Hey, RN.  She was notified by Vashti Hey RN.         Prior Anticoagulation Instructions: INR 2.4 Take coumadin 15mg  once daily except 20mg  on Saturday  Current Anticoagulation Instructions: INR 1.0 Pt has not taken any coumadin in 3 days.  Was taking 5mg  once daily before that.   Take coumadin 15mg  once daily except 20mg  on Saturdays  Appended Document: protime/tg Spoke with mother.  Asked her to have [place] call office to move up appt up from 3/14 to 3/7.  Awaiting return call.

## 2011-03-05 NOTE — Letter (Signed)
Summary: Appointment - Missed  [facility] [place]  [address], [place] [address]   Phone: [phone]  Fax: [phone]     February 23, 2011 MRN: [id number]   [facility] [address] [place], [place]  [address]   Dear Ms. Henrichs,  Our records indicate you missed your appointment on       02/23/11 COUMADIN CLINIC                     It is very important that we reach you to reschedule this appointment. We look forward to participating in your health care needs. Please contact us at the number listed above at your earliest convenience to reschedule this appointment.     Sincerely,    Glass blower/designer

## 2011-03-09 LAB — CBC
MCHC: 34.8 g/dL (ref 30.0–36.0)
RDW: 14.6 % (ref 11.5–15.5)
WBC: 12.8 10*3/uL — ABNORMAL HIGH (ref 4.0–10.5)

## 2011-03-09 LAB — DIFFERENTIAL
Basophils Absolute: 0.1 10*3/uL (ref 0.0–0.1)
Basophils Relative: 1 % (ref 0–1)
Lymphocytes Relative: 21 % (ref 12–46)
Neutro Abs: 9 10*3/uL — ABNORMAL HIGH (ref 1.7–7.7)
Neutrophils Relative %: 70 % (ref 43–77)

## 2011-03-09 LAB — PROTIME-INR
INR: 2.7 — ABNORMAL HIGH (ref 0.00–1.49)
INR: 3.29 — ABNORMAL HIGH (ref 0.00–1.49)
INR: 1.16 (ref 0.00–1.49)
INR: 1.2 (ref 0.00–1.49)
Prothrombin Time: 33.5 seconds — ABNORMAL HIGH (ref 11.6–15.2)
Prothrombin Time: 15 seconds (ref 11.6–15.2)
Prothrombin Time: 15.4 seconds — ABNORMAL HIGH (ref 11.6–15.2)
Prothrombin Time: 18.8 seconds — ABNORMAL HIGH (ref 11.6–15.2)

## 2011-03-09 LAB — BASIC METABOLIC PANEL
BUN: 12 mg/dL (ref 6–23)
Calcium: 9.7 mg/dL (ref 8.4–10.5)
GFR calc non Af Amer: >60 mL/min (ref >60)
Glucose, Bld: 101 mg/dL — ABNORMAL HIGH (ref 70–99)
Potassium: 3.8 mEq/L (ref 3.5–5.1)
Sodium: 139 mEq/L (ref 135–145)

## 2011-03-09 LAB — ETHANOL: Alcohol, Ethyl (B): 123 mg/dL — ABNORMAL HIGH (ref 0–10)

## 2011-03-09 LAB — RAPID URINE DRUG SCREEN, HOSP PERFORMED
Cocaine: POSITIVE — AB
Tetrahydrocannabinol: NOT DETECTED

## 2011-03-09 LAB — PREGNANCY, URINE: Preg Test, Ur: NEGATIVE

## 2011-03-09 LAB — D-DIMER, QUANTITATIVE: D-Dimer, Quant: 3.17 ug/mL-FEU — ABNORMAL HIGH (ref 0.00–0.48)

## 2011-03-09 IMAGING — CR DG CHEST 2V
2 series · 2 of 2 positions shown · non-contrast
Comparison: 07/07/2010

CLINICAL DATA: Pneumonia, shortness of breath, history of pulmonary
embolism

CHEST 2 VIEWS

[view not recorded (1 of 2)]
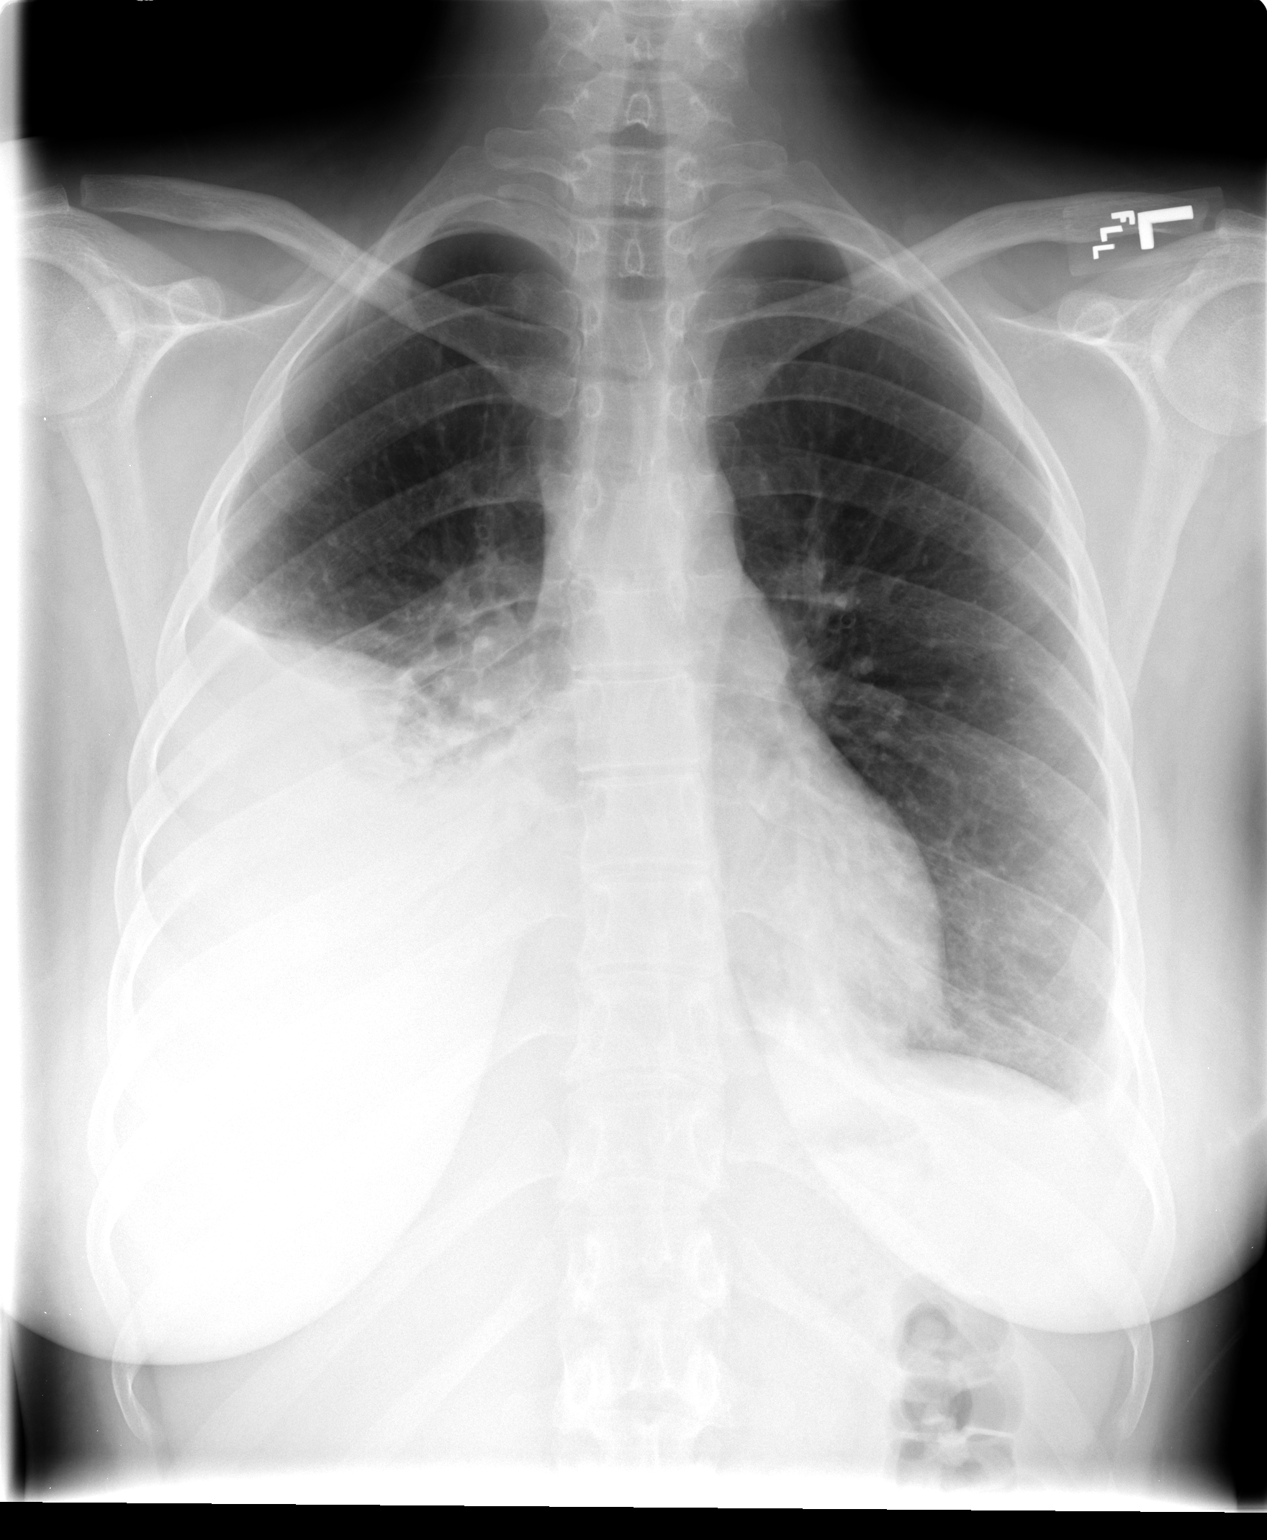

[view not recorded (2 of 2)]
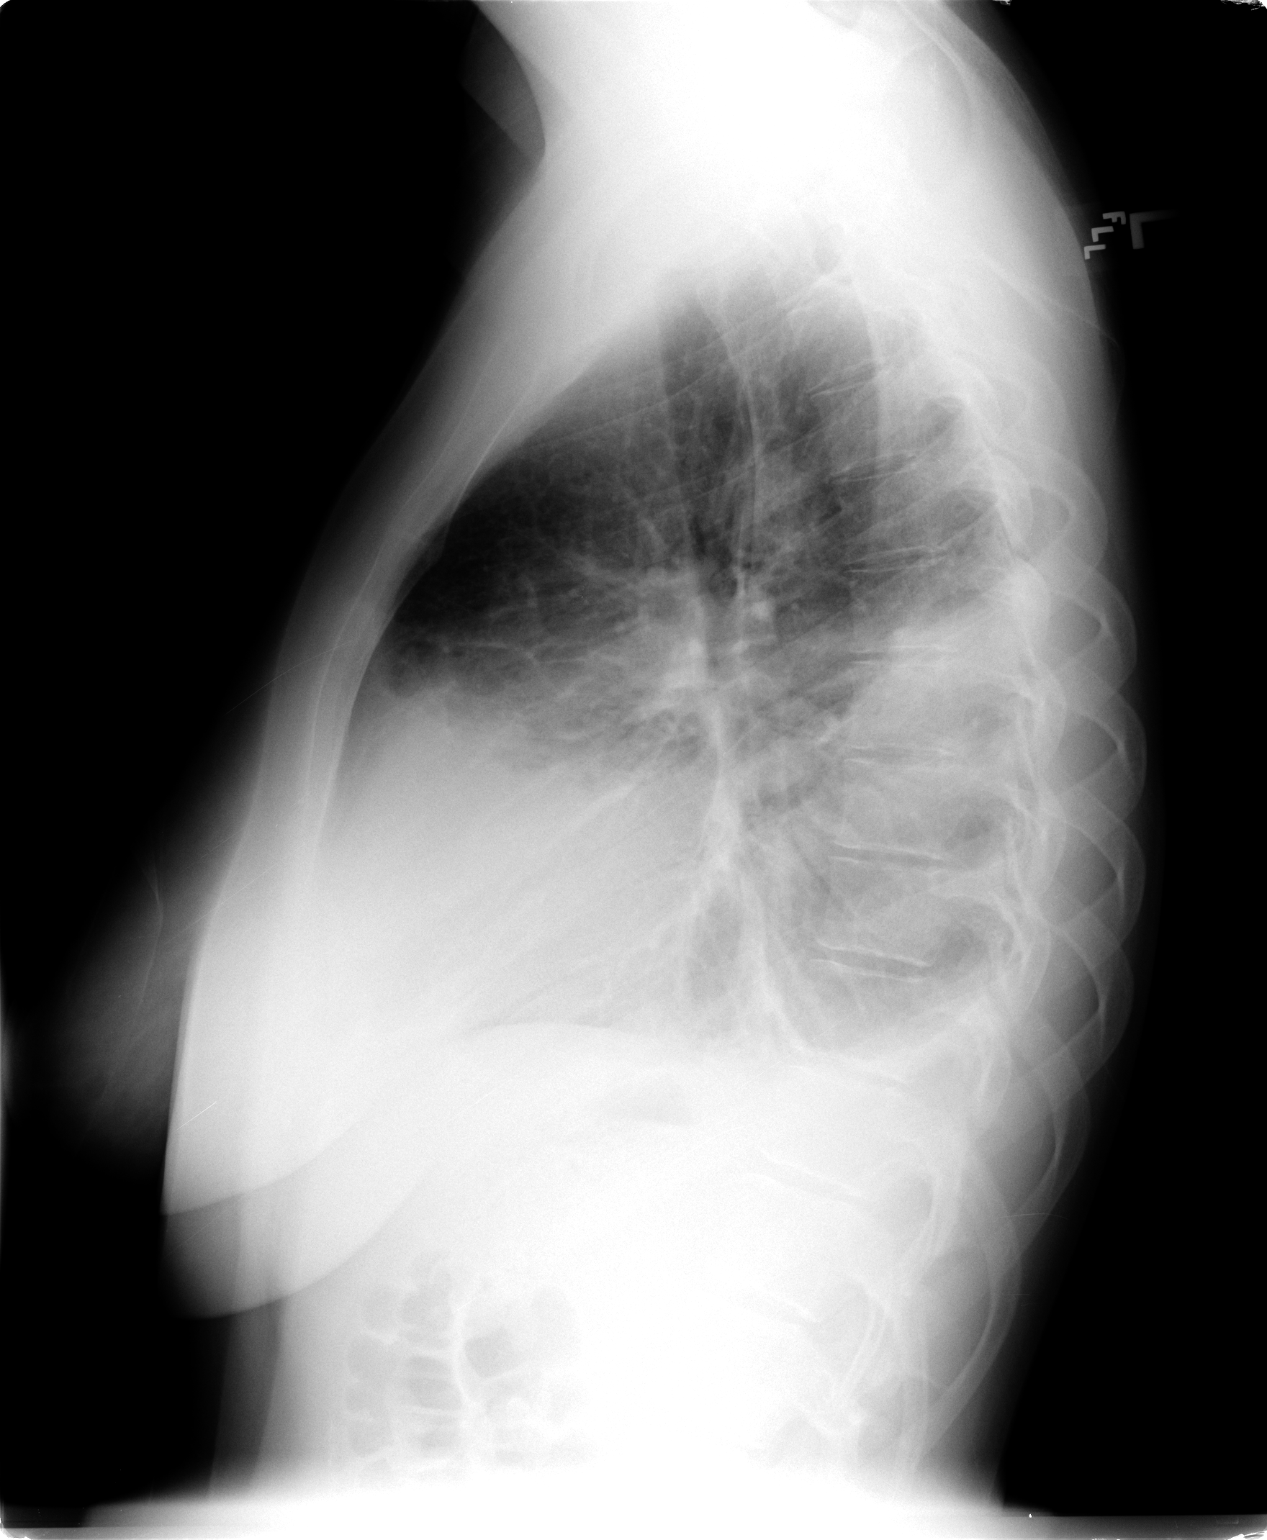

[2 of 2 positions shown; findings below may reference images not displayed]

FINDINGS: Normal heart size and mediastinal contours.
Again identified large right pleural effusion with significant
atelectasis of the lower right lung.
Underlying mass and infiltrate not excluded.
Left lung base shows a small left pleural effusion and basilar
atelectasis.
Underlying bronchitic changes.
No pneumothorax or focal bony abnormality.
IMPRESSION: No interval change.

## 2011-03-11 ENCOUNTER — Encounter (INDEPENDENT_AMBULATORY_CARE_PROVIDER_SITE_OTHER): Payer: Self-pay

## 2011-03-11 ENCOUNTER — Encounter: Payer: Self-pay | Admitting: Cardiology

## 2011-03-11 DIAGNOSIS — I80299 Phlebitis and thrombophlebitis of other deep vessels of unspecified lower extremity: Secondary | ICD-10-CM

## 2011-03-11 DIAGNOSIS — Z7901 Long term (current) use of anticoagulants: Secondary | ICD-10-CM

## 2011-03-11 LAB — PROTIME-INR
INR: 1.45 (ref 0.00–1.49)
INR: 5.9 (ref 0.00–1.49)
Prothrombin Time: 17.8 seconds — ABNORMAL HIGH (ref 11.6–15.2)
Prothrombin Time: 27.7 seconds — ABNORMAL HIGH (ref 11.6–15.2)

## 2011-03-12 LAB — PROTIME-INR
INR: 2.04 — ABNORMAL HIGH (ref 0.00–1.49)
INR: 2.58 — ABNORMAL HIGH (ref 0.00–1.49)
INR: 2.47 — ABNORMAL HIGH (ref 0.00–1.49)
Prothrombin Time: 14.3 seconds (ref 11.6–15.2)
Prothrombin Time: 17 seconds — ABNORMAL HIGH (ref 11.6–15.2)
Prothrombin Time: 22.9 seconds — ABNORMAL HIGH (ref 11.6–15.2)
Prothrombin Time: 26.9 seconds — ABNORMAL HIGH (ref 11.6–15.2)
Prothrombin Time: 28 seconds — ABNORMAL HIGH (ref 11.6–15.2)

## 2011-03-12 LAB — URINALYSIS, ROUTINE W REFLEX MICROSCOPIC
Glucose, UA: NEGATIVE mg/dL
Ketones, ur: NEGATIVE mg/dL
Leukocytes, UA: NEGATIVE
Nitrite: NEGATIVE
Specific Gravity, Urine: 1.011 (ref 1.005–1.030)
pH: 5.5 (ref 5.0–8.0)

## 2011-03-12 LAB — CBC
HCT: 36.1 % (ref 36.0–46.0)
Hemoglobin: 10.8 g/dL — ABNORMAL LOW (ref 12.0–15.0)
Hemoglobin: 11.9 g/dL — ABNORMAL LOW (ref 12.0–15.0)
MCHC: 32.9 g/dL (ref 30.0–36.0)
MCHC: 33 g/dL (ref 30.0–36.0)
RDW: 18.5 % — ABNORMAL HIGH (ref 11.5–15.5)
WBC: 6.5 10*3/uL (ref 4.0–10.5)
WBC: 9 10*3/uL (ref 4.0–10.5)

## 2011-03-12 LAB — DIFFERENTIAL
Basophils Absolute: 0.1 10*3/uL (ref 0.0–0.1)
Basophils Relative: 1 % (ref 0–1)
Lymphocytes Relative: 47 % — ABNORMAL HIGH (ref 12–46)
Monocytes Absolute: 0.6 10*3/uL (ref 0.1–1.0)
Neutro Abs: 3.8 10*3/uL (ref 1.7–7.7)
Neutrophils Relative %: 42 % — ABNORMAL LOW (ref 43–77)

## 2011-03-12 LAB — POCT I-STAT, CHEM 8
BUN: 9 mg/dL (ref 6–23)
Chloride: 106 mEq/L (ref 96–112)
HCT: 39 % (ref 36.0–46.0)
Potassium: 3.7 mEq/L (ref 3.5–5.1)
Sodium: 143 mEq/L (ref 135–145)

## 2011-03-12 LAB — FERRITIN: Ferritin: 55 ng/mL (ref 10–291)

## 2011-03-12 LAB — ETHANOL: Alcohol, Ethyl (B): 302 mg/dL — ABNORMAL HIGH (ref 0–10)

## 2011-03-12 LAB — POCT PREGNANCY, URINE: Preg Test, Ur: NEGATIVE

## 2011-03-12 LAB — URINE MICROSCOPIC-ADD ON

## 2011-03-12 LAB — APTT: aPTT: 33 seconds (ref 24–37)

## 2011-03-12 LAB — RAPID URINE DRUG SCREEN, HOSP PERFORMED
Barbiturates: NOT DETECTED
Benzodiazepines: POSITIVE — AB

## 2011-03-12 LAB — FOLATE: Folate: >20 ng/mL

## 2011-03-14 LAB — DIFFERENTIAL
Basophils Absolute: 0.1 10*3/uL (ref 0.0–0.1)
Basophils Relative: 1 % (ref 0–1)
Basophils Relative: 2 % — ABNORMAL HIGH (ref 0–1)
Eosinophils Absolute: 0.4 10*3/uL (ref 0.0–0.7)
Eosinophils Absolute: 0.5 10*3/uL (ref 0.0–0.7)
Eosinophils Absolute: 0.6 10*3/uL (ref 0.0–0.7)
Eosinophils Relative: 4 % (ref 0–5)
Eosinophils Relative: 7 % — ABNORMAL HIGH (ref 0–5)
Lymphocytes Relative: 26 % (ref 12–46)
Lymphocytes Relative: 31 % (ref 12–46)
Lymphocytes Relative: 34 % (ref 12–46)
Lymphs Abs: 2.9 10*3/uL (ref 0.7–4.0)
Lymphs Abs: 3.5 10*3/uL (ref 0.7–4.0)
Lymphs Abs: 3.6 10*3/uL (ref 0.7–4.0)
Monocytes Absolute: 0.6 10*3/uL (ref 0.1–1.0)
Monocytes Absolute: 0.6 10*3/uL (ref 0.1–1.0)
Monocytes Absolute: 0.6 10*3/uL (ref 0.1–1.0)
Monocytes Relative: 7 % (ref 3–12)
Neutro Abs: 4.2 10*3/uL (ref 1.7–7.7)
Neutro Abs: 6 10*3/uL (ref 1.7–7.7)
Neutrophils Relative %: 50 % (ref 43–77)
Neutrophils Relative %: 56 % (ref 43–77)
Smear Review: INCREASED

## 2011-03-14 LAB — BASIC METABOLIC PANEL
BUN: 7 mg/dL (ref 6–23)
BUN: 8 mg/dL (ref 6–23)
CO2: 24 mEq/L (ref 19–32)
CO2: 28 mEq/L (ref 19–32)
Calcium: 8.8 mg/dL (ref 8.4–10.5)
Calcium: 8.9 mg/dL (ref 8.4–10.5)
Chloride: 100 mEq/L (ref 96–112)
Chloride: 101 mEq/L (ref 96–112)
Creatinine, Ser: 0.81 mg/dL (ref 0.4–1.2)
Creatinine, Ser: 0.88 mg/dL (ref 0.4–1.2)
GFR calc Af Amer: >60 mL/min (ref >60)
GFR calc Af Amer: >60 mL/min (ref >60)
GFR calc non Af Amer: >60 mL/min (ref >60)
Glucose, Bld: 107 mg/dL — ABNORMAL HIGH (ref 70–99)
Glucose, Bld: 90 mg/dL (ref 70–99)
Glucose, Bld: 97 mg/dL (ref 70–99)
Potassium: 4.1 mEq/L (ref 3.5–5.1)
Potassium: 4.7 mEq/L (ref 3.5–5.1)
Sodium: 137 mEq/L (ref 135–145)
Sodium: 138 mEq/L (ref 135–145)

## 2011-03-14 LAB — APTT: aPTT: 50 seconds — ABNORMAL HIGH (ref 24–37)

## 2011-03-14 LAB — CBC
HCT: 27.4 % — ABNORMAL LOW (ref 36.0–46.0)
HCT: 31.3 % — ABNORMAL LOW (ref 36.0–46.0)
Hemoglobin: 10.4 g/dL — ABNORMAL LOW (ref 12.0–15.0)
MCH: 26.2 pg (ref 26.0–34.0)
MCH: 26.3 pg (ref 26.0–34.0)
MCH: 26.7 pg (ref 26.0–34.0)
MCHC: 32.5 g/dL (ref 30.0–36.0)
MCHC: 33.2 g/dL (ref 30.0–36.0)
MCV: 80.5 fL (ref 78.0–100.0)
MCV: 80.5 fL (ref 78.0–100.0)
Platelets: 862 10*3/uL — ABNORMAL HIGH (ref 150–400)
Platelets: 920 10*3/uL (ref 150–400)
RBC: 3.4 MIL/uL — ABNORMAL LOW (ref 3.87–5.11)
RBC: 3.43 MIL/uL — ABNORMAL LOW (ref 3.87–5.11)
RBC: 3.98 MIL/uL (ref 3.87–5.11)
RDW: 17 % — ABNORMAL HIGH (ref 11.5–15.5)
RDW: 17.3 % — ABNORMAL HIGH (ref 11.5–15.5)
WBC: 10.8 10*3/uL — ABNORMAL HIGH (ref 4.0–10.5)
WBC: 11.2 10*3/uL — ABNORMAL HIGH (ref 4.0–10.5)
WBC: 8.2 10*3/uL (ref 4.0–10.5)

## 2011-03-14 LAB — PROTIME-INR
INR: 1.66 — ABNORMAL HIGH (ref 0.00–1.49)
INR: 1.74 — ABNORMAL HIGH (ref 0.00–1.49)
INR: 1.85 — ABNORMAL HIGH (ref 0.00–1.49)
INR: 2.84 — ABNORMAL HIGH (ref 0.00–1.49)
Prothrombin Time: 21.2 seconds — ABNORMAL HIGH (ref 11.6–15.2)

## 2011-03-14 LAB — POCT CARDIAC MARKERS
CKMB, poc: <1 ng/mL — ABNORMAL LOW (ref 1.0–8.0)
Myoglobin, poc: 45.4 ng/mL (ref 12–200)
Troponin i, poc: <0.05 ng/mL (ref 0.00–0.09)

## 2011-03-14 LAB — HEPARIN LEVEL (UNFRACTIONATED)
Heparin Unfractionated: 0.23 IU/mL — ABNORMAL LOW (ref 0.30–0.70)
Heparin Unfractionated: 0.45 IU/mL (ref 0.30–0.70)
Heparin Unfractionated: 0.46 IU/mL (ref 0.30–0.70)
Heparin Unfractionated: <0.1 IU/mL — ABNORMAL LOW (ref 0.30–0.70)

## 2011-03-15 LAB — BLOOD GAS, ARTERIAL
Acid-base deficit: 1.8 mmol/L (ref 0.0–2.0)
O2 Saturation: 98 %
TCO2: 19.6 mmol/L (ref 0–100)
pCO2 arterial: 31 mmHg — ABNORMAL LOW (ref 35.0–45.0)

## 2011-03-15 LAB — DIFFERENTIAL
Basophils Absolute: 0 10*3/uL (ref 0.0–0.1)
Basophils Absolute: 0.1 10*3/uL (ref 0.0–0.1)
Basophils Absolute: 0.1 10*3/uL (ref 0.0–0.1)
Basophils Absolute: 0.1 10*3/uL (ref 0.0–0.1)
Basophils Relative: 0 % (ref 0–1)
Basophils Relative: 0 % (ref 0–1)
Basophils Relative: 1 % (ref 0–1)
Basophils Relative: 1 % (ref 0–1)
Basophils Relative: 1 % (ref 0–1)
Basophils Relative: 1 % (ref 0–1)
Eosinophils Absolute: 0.2 10*3/uL (ref 0.0–0.7)
Eosinophils Absolute: 0.3 10*3/uL (ref 0.0–0.7)
Eosinophils Absolute: 0.4 10*3/uL (ref 0.0–0.7)
Eosinophils Absolute: 0.5 10*3/uL (ref 0.0–0.7)
Eosinophils Relative: 5 % (ref 0–5)
Lymphocytes Relative: 28 % (ref 12–46)
Lymphs Abs: 1.9 10*3/uL (ref 0.7–4.0)
Lymphs Abs: 3.2 10*3/uL (ref 0.7–4.0)
Lymphs Abs: 3.5 10*3/uL (ref 0.7–4.0)
Monocytes Absolute: 0.7 10*3/uL (ref 0.1–1.0)
Monocytes Absolute: 0.6 10*3/uL (ref 0.1–1.0)
Monocytes Relative: 10 % (ref 3–12)
Monocytes Relative: 7 % (ref 3–12)
Monocytes Relative: 9 % (ref 3–12)
Monocytes Relative: 6 % (ref 3–12)
Monocytes Relative: 7 % (ref 3–12)
Neutro Abs: 10.6 10*3/uL — ABNORMAL HIGH (ref 1.7–7.7)
Neutro Abs: 6.4 10*3/uL (ref 1.7–7.7)
Neutro Abs: 6.5 10*3/uL (ref 1.7–7.7)
Neutro Abs: 6.8 10*3/uL (ref 1.7–7.7)
Neutro Abs: 9.2 10*3/uL — ABNORMAL HIGH (ref 1.7–7.7)
Neutro Abs: 5.7 10*3/uL (ref 1.7–7.7)
Neutrophils Relative %: 63 % (ref 43–77)
Neutrophils Relative %: 63 % (ref 43–77)
Neutrophils Relative %: 74 % (ref 43–77)
Neutrophils Relative %: 76 % (ref 43–77)
Neutrophils Relative %: 54 % (ref 43–77)

## 2011-03-15 LAB — BASIC METABOLIC PANEL
BUN: 5 mg/dL — ABNORMAL LOW (ref 6–23)
BUN: 8 mg/dL (ref 6–23)
CO2: 25 mEq/L (ref 19–32)
CO2: 26 mEq/L (ref 19–32)
CO2: 25 mEq/L (ref 19–32)
Calcium: 8.6 mg/dL (ref 8.4–10.5)
Calcium: 9 mg/dL (ref 8.4–10.5)
Calcium: 9.1 mg/dL (ref 8.4–10.5)
Calcium: 9.3 mg/dL (ref 8.4–10.5)
Calcium: 9.3 mg/dL (ref 8.4–10.5)
Creatinine, Ser: 0.64 mg/dL (ref 0.4–1.2)
Creatinine, Ser: 0.88 mg/dL (ref 0.4–1.2)
GFR calc Af Amer: >60 mL/min (ref >60)
GFR calc Af Amer: >60 mL/min (ref >60)
GFR calc Af Amer: >60 mL/min (ref >60)
GFR calc non Af Amer: >60 mL/min (ref >60)
GFR calc non Af Amer: >60 mL/min (ref >60)
GFR calc non Af Amer: >60 mL/min (ref >60)
GFR calc non Af Amer: >60 mL/min (ref >60)
GFR calc non Af Amer: >60 mL/min (ref >60)
GFR calc non Af Amer: >60 mL/min (ref >60)
Glucose, Bld: 102 mg/dL — ABNORMAL HIGH (ref 70–99)
Glucose, Bld: 103 mg/dL — ABNORMAL HIGH (ref 70–99)
Glucose, Bld: 98 mg/dL (ref 70–99)
Glucose, Bld: 98 mg/dL (ref 70–99)
Glucose, Bld: 99 mg/dL (ref 70–99)
Potassium: 3.6 mEq/L (ref 3.5–5.1)
Potassium: 4 mEq/L (ref 3.5–5.1)
Sodium: 138 mEq/L (ref 135–145)
Sodium: 138 mEq/L (ref 135–145)
Sodium: 138 mEq/L (ref 135–145)
Sodium: 140 mEq/L (ref 135–145)

## 2011-03-15 LAB — PROTIME-INR
INR: 1.09 (ref 0.00–1.49)
INR: 1.99 — ABNORMAL HIGH (ref 0.00–1.49)
INR: 2.1 — ABNORMAL HIGH (ref 0.00–1.49)
INR: 2.67 — ABNORMAL HIGH (ref 0.00–1.49)
INR: 1.05 (ref 0.00–1.49)
INR: 1.14 (ref 0.00–1.49)
INR: 1.15 (ref 0.00–1.49)
Prothrombin Time: 14 seconds (ref 11.6–15.2)
Prothrombin Time: 25 seconds — ABNORMAL HIGH (ref 11.6–15.2)
Prothrombin Time: 28.2 seconds — ABNORMAL HIGH (ref 11.6–15.2)
Prothrombin Time: 13.6 seconds (ref 11.6–15.2)
Prothrombin Time: 14.5 seconds (ref 11.6–15.2)
Prothrombin Time: 14.6 seconds (ref 11.6–15.2)

## 2011-03-15 LAB — IRON AND TIBC
Saturation Ratios: 5 % — ABNORMAL LOW (ref 20–55)
UIBC: 395 ug/dL

## 2011-03-15 LAB — ANTITHROMBIN III: AntiThromb III Func: 105 % (ref 76–126)

## 2011-03-15 LAB — FOLATE: Folate: 4.9 ng/mL

## 2011-03-15 LAB — BODY FLUID CULTURE: Culture: NO GROWTH

## 2011-03-15 LAB — HEPARIN LEVEL (UNFRACTIONATED)
Heparin Unfractionated: 0.12 IU/mL — ABNORMAL LOW (ref 0.30–0.70)
Heparin Unfractionated: <0.1 IU/mL — ABNORMAL LOW (ref 0.30–0.70)
Heparin Unfractionated: 0.34 IU/mL (ref 0.30–0.70)
Heparin Unfractionated: <0.1 IU/mL — ABNORMAL LOW (ref 0.30–0.70)

## 2011-03-15 LAB — CULTURE, BLOOD (ROUTINE X 2)
Report Status: 7072011
Report Status: 7072011

## 2011-03-15 LAB — CBC
HCT: 28.8 % — ABNORMAL LOW (ref 36.0–46.0)
HCT: 28 % — ABNORMAL LOW (ref 36.0–46.0)
Hemoglobin: 8.7 g/dL — ABNORMAL LOW (ref 12.0–15.0)
Hemoglobin: 9.4 g/dL — ABNORMAL LOW (ref 12.0–15.0)
Hemoglobin: 9.8 g/dL — ABNORMAL LOW (ref 12.0–15.0)
Hemoglobin: 9 g/dL — ABNORMAL LOW (ref 12.0–15.0)
Hemoglobin: 9.7 g/dL — ABNORMAL LOW (ref 12.0–15.0)
MCH: 26.4 pg (ref 26.0–34.0)
MCH: 26.5 pg (ref 26.0–34.0)
MCH: 27 pg (ref 26.0–34.0)
MCH: 27.6 pg (ref 26.0–34.0)
MCH: 27.7 pg (ref 26.0–34.0)
MCHC: 32.3 g/dL (ref 30.0–36.0)
MCHC: 32.7 g/dL (ref 30.0–36.0)
MCHC: 32.9 g/dL (ref 30.0–36.0)
MCHC: 33.1 g/dL (ref 30.0–36.0)
MCHC: 32.3 g/dL (ref 30.0–36.0)
MCHC: 32.9 g/dL (ref 30.0–36.0)
MCHC: 33 g/dL (ref 30.0–36.0)
MCHC: 33.1 g/dL (ref 30.0–36.0)
Platelets: 1022 10*3/uL (ref 150–400)
Platelets: 837 10*3/uL — ABNORMAL HIGH (ref 150–400)
Platelets: 900 10*3/uL — ABNORMAL HIGH (ref 150–400)
Platelets: 640 10*3/uL — ABNORMAL HIGH (ref 150–400)
Platelets: 652 10*3/uL — ABNORMAL HIGH (ref 150–400)
Platelets: 656 10*3/uL — ABNORMAL HIGH (ref 150–400)
RBC: 3.76 MIL/uL — ABNORMAL LOW (ref 3.87–5.11)
RBC: 3.33 MIL/uL — ABNORMAL LOW (ref 3.87–5.11)
RBC: 3.77 MIL/uL — ABNORMAL LOW (ref 3.87–5.11)
RDW: 15.8 % — ABNORMAL HIGH (ref 11.5–15.5)
RDW: 16.7 % — ABNORMAL HIGH (ref 11.5–15.5)
RDW: 17.1 % — ABNORMAL HIGH (ref 11.5–15.5)
RDW: 15.8 % — ABNORMAL HIGH (ref 11.5–15.5)
RDW: 16.3 % — ABNORMAL HIGH (ref 11.5–15.5)
RDW: 16.4 % — ABNORMAL HIGH (ref 11.5–15.5)
WBC: 12.4 10*3/uL — ABNORMAL HIGH (ref 4.0–10.5)

## 2011-03-15 LAB — LUPUS ANTICOAGULANT PANEL
PTTLA 4:1 Mix: 52.6 secs — ABNORMAL HIGH (ref 30.0–45.6)
PTTLA Confirmation: 13.8 secs — ABNORMAL HIGH (ref <8.0)
dRVVT Incubated 1:1 Mix: 42.2 secs (ref 36.2–44.3)

## 2011-03-15 LAB — PATHOLOGIST SMEAR REVIEW

## 2011-03-15 LAB — COMPREHENSIVE METABOLIC PANEL
ALT: 28 U/L (ref 0–35)
AST: 20 U/L (ref 0–37)
Calcium: 8.6 mg/dL (ref 8.4–10.5)
Creatinine, Ser: 0.8 mg/dL (ref 0.4–1.2)
GFR calc Af Amer: >60 mL/min (ref >60)
Sodium: 137 mEq/L (ref 135–145)
Total Protein: 6.5 g/dL (ref 6.0–8.3)

## 2011-03-15 LAB — BODY FLUID CELL COUNT WITH DIFFERENTIAL
Lymphs, Fluid: 47 %
Monocyte-Macrophage-Serous Fluid: 42 % — ABNORMAL LOW (ref 50–90)
Neutrophil Count, Fluid: 10 % (ref 0–25)
Total Nucleated Cell Count, Fluid: UNDETERMINED cu mm (ref 0–1000)

## 2011-03-15 LAB — PROTEIN S ACTIVITY: Protein S Activity: 109 % (ref 69–129)

## 2011-03-15 LAB — RETICULOCYTES
RBC.: 3.75 MIL/uL — ABNORMAL LOW (ref 3.87–5.11)
Retic Count, Absolute: 48.8 10*3/uL (ref 19.0–186.0)

## 2011-03-15 LAB — PHOSPHORUS: Phosphorus: 5.6 mg/dL — ABNORMAL HIGH (ref 2.3–4.6)

## 2011-03-15 LAB — HEPATIC FUNCTION PANEL
AST: 27 U/L (ref 0–37)
Bilirubin, Direct: 0.1 mg/dL (ref 0.0–0.3)
Indirect Bilirubin: 0.2 mg/dL — ABNORMAL LOW (ref 0.3–0.9)
Total Protein: 7.2 g/dL (ref 6.0–8.3)

## 2011-03-15 LAB — MAGNESIUM: Magnesium: 1.9 mg/dL (ref 1.5–2.5)

## 2011-03-15 LAB — PROTEIN C ACTIVITY: Protein C Activity: 133 % (ref 75–133)

## 2011-03-15 LAB — MRSA PCR SCREENING: MRSA by PCR: NEGATIVE

## 2011-03-15 LAB — T4, FREE: Free T4: 1.29 ng/dL (ref 0.80–1.80)

## 2011-03-15 LAB — HOMOCYSTEINE: Homocysteine: 24 umol/L — ABNORMAL HIGH (ref 4.0–15.4)

## 2011-03-15 LAB — FACTOR 5 LEIDEN

## 2011-03-16 ENCOUNTER — Encounter (INDEPENDENT_AMBULATORY_CARE_PROVIDER_SITE_OTHER): Payer: Self-pay

## 2011-03-16 ENCOUNTER — Encounter: Payer: Self-pay | Admitting: Cardiology

## 2011-03-16 DIAGNOSIS — Z7901 Long term (current) use of anticoagulants: Secondary | ICD-10-CM

## 2011-03-16 DIAGNOSIS — I2699 Other pulmonary embolism without acute cor pulmonale: Secondary | ICD-10-CM

## 2011-03-17 LAB — CBC
HCT: 23 % — ABNORMAL LOW (ref 36.0–46.0)
HCT: 24.5 % — ABNORMAL LOW (ref 36.0–46.0)
Hemoglobin: 8 g/dL — ABNORMAL LOW (ref 12.0–15.0)
Hemoglobin: 8.2 g/dL — ABNORMAL LOW (ref 12.0–15.0)
Hemoglobin: 9.9 g/dL — ABNORMAL LOW (ref 12.0–15.0)
MCHC: 33.6 g/dL (ref 30.0–36.0)
MCHC: 33.7 g/dL (ref 30.0–36.0)
MCV: 85.5 fL (ref 78.0–100.0)
RBC: 2.83 MIL/uL — ABNORMAL LOW (ref 3.87–5.11)
RBC: 3.46 MIL/uL — ABNORMAL LOW (ref 3.87–5.11)
RDW: 16.9 % — ABNORMAL HIGH (ref 11.5–15.5)
RDW: 17 % — ABNORMAL HIGH (ref 11.5–15.5)
RDW: 16.9 % — ABNORMAL HIGH (ref 11.5–15.5)
WBC: 12.1 10*3/uL — ABNORMAL HIGH (ref 4.0–10.5)

## 2011-03-17 LAB — RPR: RPR Ser Ql: NONREACTIVE

## 2011-03-17 LAB — HIV 1/2 CONFIRMATION: HIV-2 Ab: NEGATIVE

## 2011-03-17 LAB — RUBELLA SCREEN: Rubella: 45 IU/mL — ABNORMAL HIGH

## 2011-03-17 LAB — RAPID URINE DRUG SCREEN, HOSP PERFORMED
Amphetamines: NOT DETECTED
Opiates: POSITIVE — AB
Tetrahydrocannabinol: NOT DETECTED

## 2011-03-17 LAB — DIFFERENTIAL
Basophils Absolute: 0.1 10*3/uL (ref 0.0–0.1)
Basophils Relative: 0 % (ref 0–1)
Eosinophils Absolute: 0.2 10*3/uL (ref 0.0–0.7)
Monocytes Absolute: 1.3 10*3/uL — ABNORMAL HIGH (ref 0.1–1.0)
Monocytes Relative: 10 % (ref 3–12)
Neutrophils Relative %: 65 % (ref 43–77)

## 2011-03-17 NOTE — Medication Information (Signed)
Summary: ccr-lr  Anticoagulant Therapy  Managed by: Vashti Hey, RN PCP: Dr. Avon Gully Supervising MD: Daleen Squibb MD, Maisie Fus Indication 1: Pulmonary Embolism Indication 2: Lupus Anticoagulant  Lab Used: [facility]  Site: [place] INR POC 1.0  Dietary changes: no    Health status changes: no    Bleeding/hemorrhagic complications: no    Recent/future hospitalizations: no    Any changes in medication regimen? no    Recent/future dental: no  Any missed doses?: yes     Details: Missed 1 dose 2 days ago  Is patient compliant with meds? no     Details: Do not think pt is taking meds like she says   Allergies: No Known Drug Allergies  Anticoagulation Management History:      The patient is taking warfarin and comes in today for a routine follow up visit.  Negative risk factors for bleeding include an age less than 72 years old.  The bleeding index is 'low risk'.  Negative CHADS2 values include Age > 14 years old.  Anticoagulation responsible provider: Daleen Squibb MD, Maisie Fus.  INR POC: 1.0.  Cuvette Lot#: [id number].    Anticoagulation Management Assessment/Plan:      The patient's current anticoagulation dose is Warfarin sodium 10 mg tabs: Take as directed by coumadin clinic.  The target INR is 2.0-3.0.  The next INR is due 03/16/2011.  Anticoagulation instructions were given to patient.  Results were reviewed/authorized by Vashti Hey, RN.  She was notified by Vashti Hey RN.         Prior Anticoagulation Instructions: INR 1.0 Pt has not taken any coumadin in 3 days.  Was taking 5mg  once daily before that.   Take coumadin 15mg  once daily except 20mg  on Saturdays  Current Anticoagulation Instructions: INR 1.0 Pt assures me she has only missed 1 dose 2 days ago Take coumadin 20mg  once daily and recheck INR on 03/16/11 [phone]

## 2011-03-19 ENCOUNTER — Encounter: Payer: Self-pay | Admitting: *Deleted

## 2011-03-25 ENCOUNTER — Ambulatory Visit (INDEPENDENT_AMBULATORY_CARE_PROVIDER_SITE_OTHER): Payer: Self-pay | Admitting: Cardiology

## 2011-03-25 ENCOUNTER — Encounter: Payer: Self-pay | Admitting: Cardiology

## 2011-03-25 ENCOUNTER — Ambulatory Visit (INDEPENDENT_AMBULATORY_CARE_PROVIDER_SITE_OTHER): Payer: Self-pay | Admitting: *Deleted

## 2011-03-25 VITALS — BP 109/74 | HR 87 | Ht 67.0 in | Wt 187.0 lb

## 2011-03-25 DIAGNOSIS — D6861 Antiphospholipid syndrome: Secondary | ICD-10-CM | POA: Insufficient documentation

## 2011-03-25 DIAGNOSIS — Z7901 Long term (current) use of anticoagulants: Secondary | ICD-10-CM

## 2011-03-25 DIAGNOSIS — F1911 Other psychoactive substance abuse, in remission: Secondary | ICD-10-CM

## 2011-03-25 DIAGNOSIS — F411 Generalized anxiety disorder: Secondary | ICD-10-CM

## 2011-03-25 DIAGNOSIS — D6859 Other primary thrombophilia: Secondary | ICD-10-CM

## 2011-03-25 DIAGNOSIS — I2699 Other pulmonary embolism without acute cor pulmonale: Secondary | ICD-10-CM

## 2011-03-25 LAB — POCT INR: INR: 1.7

## 2011-03-25 NOTE — Progress Notes (Signed)
HPI : Patient has done well in recent months with no dyspnea, orthopnea, PND or syncope.  She notes subjective swelling of the lower extremities, but edema has not been documented.  Compliance with medication and with Coumadin clinic visits has been suboptimal.  Current Outpatient Prescriptions on File Prior to Visit  Medication Sig Dispense Refill  . DISCONTD: warfarin (COUMADIN) 10 MG tablet Take 10 mg by mouth daily. As directed by Anticoagulation clinic.         No Known Allergies    Past medical history, social history, and family history reviewed and updated.  ROS: See history of present illness  PHYSICAL EXAM: BP 109/74  Pulse 87  Ht 5\' 7"  (1.702 m)  Wt 187 lb (84.823 kg)  BMI 29.29 kg/m2  SpO2 97%  General-Well developed; no acute distress Body habitus-overweight Neck-No JVD; no carotid bruits Lungs-clear lung fields; resonant to percussion Cardiovascular-normal PMI; normal S1 and S2 Abdomen-normal bowel sounds; soft and non-tender without masses or organomegaly Musculoskeletal-No deformities, no cyanosis or clubbing Neurologic-Normal cranial nerves; symmetric strength and tone Skin-Warm, no significant lesions Extremities-distal pulses intact; no edema  EKG: Normal sinus rhythm; minimal nonspecific T wave abnormality; otherwise normal.  No previous tracing for comparison.  ASSESSMENT AND PLAN:

## 2011-03-25 NOTE — Assessment & Plan Note (Signed)
Normally, a pulmonary embolism provoked by pregnancy would be treated for 6 months at most; however, in the setting of a lupus anticoagulant, lifelong therapy may be appropriate.  We will continue to monitor anticoagulation and follow CBCs and stool for Hemoccult testing.

## 2011-03-25 NOTE — Assessment & Plan Note (Signed)
Patient became tearful towards the end of the visit time indicating that she found it difficult to talk about these medical issues.  I reassured her that with current therapy, she should not have additional thromboembolic phenomena; however, I stressed the importance of compliance with her medical regime and with appropriate INR determinations.

## 2011-03-25 NOTE — Assessment & Plan Note (Signed)
Patient has only had a thrombotic event while pregnant; unfortunately, that was a life-threatening episode.  I would be inclined to continue anticoagulation indefinitely, but will repeat an antiphospholipid antibody to verify the presence of that abnormality and its persistence.

## 2011-03-25 NOTE — Patient Instructions (Signed)
**Note De-Identified [name] Obfuscation** Your physician recommends that you return for lab work in: today Stool cards: please follow instructions given to you today

## 2011-03-26 ENCOUNTER — Encounter: Payer: Self-pay | Admitting: *Deleted

## 2011-03-26 LAB — CBC WITH DIFFERENTIAL/PLATELET
Basophils Absolute: 0 10*3/uL (ref 0.0–0.1)
Basophils Relative: 0 % (ref 0–1)
Eosinophils Absolute: 0.2 10*3/uL (ref 0.0–0.7)
Eosinophils Relative: 2 % (ref 0–5)
HCT: 40.2 % (ref 36.0–46.0)
Hemoglobin: 12.9 g/dL (ref 12.0–15.0)
MCH: 28.2 pg (ref 26.0–34.0)
MCHC: 32.1 g/dL (ref 30.0–36.0)
Monocytes Absolute: 0.7 10*3/uL (ref 0.1–1.0)
Monocytes Relative: 7 % (ref 3–12)
Neutro Abs: 7.2 10*3/uL (ref 1.7–7.7)
RDW: 14.7 % (ref 11.5–15.5)

## 2011-03-26 LAB — LUPUS ANTICOAGULANT PANEL: DRVVT 1:1 Mix: 40.6 secs (ref 36.2–44.3)

## 2011-03-26 NOTE — Medication Information (Signed)
Summary: ccr-lr  Anticoagulant Therapy  Managed by: Anna Hey, RN PCP: Dr. Avon Gully Supervising MD: Dietrich Pates MD, Molly Maduro Indication 1: Pulmonary Embolism Indication 2: Lupus Anticoagulant  Lab Used: [facility] [place] Site: [place] INR POC 2.8  Dietary changes: no    Health status changes: no    Bleeding/hemorrhagic complications: no    Recent/future hospitalizations: no    Any changes in medication regimen? no    Recent/future dental: no  Any missed doses?: no       Is patient compliant with meds? yes       Allergies: No Known Drug Allergies  Anticoagulation Management History:      The patient is taking warfarin and comes in today for a routine follow up visit.  Negative risk factors for bleeding include an age less than 22 years old.  The bleeding index is 'low risk'.  Negative CHADS2 values include Age > 22 years old.  Anticoagulation responsible provider: Dietrich Pates MD, Molly Maduro.  INR POC: 2.8.  Cuvette Lot#: [id number].    Anticoagulation Management Assessment/Plan:      The patient's current anticoagulation dose is Warfarin sodium 10 mg tabs: Take as directed by coumadin clinic.  The target INR is 2.0-3.0.  The next INR is due 03/19/2011.  Anticoagulation instructions were given to patient.  Results were reviewed/authorized by Anna Hey, RN.  She was notified by Anna Hey RN.         Prior Anticoagulation Instructions: INR 1.0 Pt assures me she has only missed 1 dose 2 days ago Take coumadin 20mg  once daily and recheck INR on 03/16/11 [phone]   Current Anticoagulation Instructions: INR 2.8 Take coumadin 15mg  once daily except 20mg  on Tuesdays, Thursdays and Saturdays

## 2011-03-29 ENCOUNTER — Encounter: Payer: Self-pay | Admitting: Cardiology

## 2011-03-31 ENCOUNTER — Encounter: Payer: Self-pay | Admitting: Cardiology

## 2011-04-06 ENCOUNTER — Encounter: Payer: Self-pay | Admitting: *Deleted

## 2011-04-07 ENCOUNTER — Encounter: Payer: Self-pay | Admitting: *Deleted

## 2011-04-07 LAB — BASIC METABOLIC PANEL
BUN: 5 mg/dL — ABNORMAL LOW (ref 6–23)
Calcium: 9.4 mg/dL (ref 8.4–10.5)
Creatinine, Ser: 0.93 mg/dL (ref 0.4–1.2)
GFR calc non Af Amer: >60 mL/min (ref >60)
Glucose, Bld: 108 mg/dL — ABNORMAL HIGH (ref 70–99)
Sodium: 139 mEq/L (ref 135–145)

## 2011-04-07 LAB — COMPREHENSIVE METABOLIC PANEL
AST: 18 U/L (ref 0–37)
Albumin: 3.8 g/dL (ref 3.5–5.2)
BUN: 11 mg/dL (ref 6–23)
Calcium: 8.4 mg/dL (ref 8.4–10.5)
Creatinine, Ser: 0.98 mg/dL (ref 0.4–1.2)
GFR calc Af Amer: >60 mL/min (ref >60)
GFR calc non Af Amer: >60 mL/min (ref >60)

## 2011-04-07 LAB — RAPID URINE DRUG SCREEN, HOSP PERFORMED
Amphetamines: NOT DETECTED
Barbiturates: NOT DETECTED
Benzodiazepines: NOT DETECTED
Opiates: NOT DETECTED
Opiates: NOT DETECTED
Tetrahydrocannabinol: NOT DETECTED

## 2011-04-07 LAB — URINALYSIS, ROUTINE W REFLEX MICROSCOPIC
Bilirubin Urine: NEGATIVE
Glucose, UA: NEGATIVE mg/dL
Nitrite: POSITIVE — AB
Specific Gravity, Urine: 1.015 (ref 1.005–1.030)
Specific Gravity, Urine: <1.005 — ABNORMAL LOW (ref 1.005–1.030)
Urobilinogen, UA: 0.2 mg/dL (ref 0.0–1.0)
pH: 6 (ref 5.0–8.0)

## 2011-04-07 LAB — DIFFERENTIAL
Basophils Absolute: 0 10*3/uL (ref 0.0–0.1)
Basophils Relative: 0 % (ref 0–1)
Monocytes Absolute: 0.4 10*3/uL (ref 0.1–1.0)
Neutro Abs: 5.9 10*3/uL (ref 1.7–7.7)
Neutrophils Relative %: 72 % (ref 43–77)

## 2011-04-07 LAB — URINE MICROSCOPIC-ADD ON

## 2011-04-07 LAB — CBC
HCT: 35 % — ABNORMAL LOW (ref 36.0–46.0)
Hemoglobin: 13.6 g/dL (ref 12.0–15.0)
MCHC: 34.4 g/dL (ref 30.0–36.0)
MCHC: 35.1 g/dL (ref 30.0–36.0)
MCV: 89.7 fL (ref 78.0–100.0)
Platelets: 391 10*3/uL (ref 150–400)
RDW: 13.2 % (ref 11.5–15.5)

## 2011-04-07 LAB — PREGNANCY, URINE: Preg Test, Ur: NEGATIVE

## 2011-04-08 ENCOUNTER — Encounter (HOSPITAL_COMMUNITY): Payer: Self-pay | Attending: Oncology | Admitting: Oncology

## 2011-04-08 LAB — URINALYSIS, ROUTINE W REFLEX MICROSCOPIC
Glucose, UA: NEGATIVE mg/dL
Ketones, ur: NEGATIVE mg/dL
Urobilinogen, UA: 0.2 mg/dL (ref 0.0–1.0)

## 2011-04-08 LAB — URINE CULTURE: Colony Count: >=100000

## 2011-04-08 LAB — URINE MICROSCOPIC-ADD ON

## 2011-04-09 ENCOUNTER — Telehealth: Payer: Self-pay | Admitting: *Deleted

## 2011-04-09 NOTE — Telephone Encounter (Signed)
Message copied by Teressa Lower on Thu Apr 09, 2011  4:10 PM ------      Message from: [place] Bing      Created: Wed Apr 08, 2011  4:56 PM       Lab results reviewed.      No change in Rx.      Discussed with Dr. Mariel Sleet who wishes to reevaluate the patient in his office before provided recommendations regarding continued anticoagulation.      His staff will contact Ms. Schrag to arrange an appointment.            [place] Bing

## 2011-04-14 LAB — BASIC METABOLIC PANEL
BUN: 7 mg/dL (ref 6–23)
Chloride: 110 mEq/L (ref 96–112)
Chloride: 106 mEq/L (ref 96–112)
Creatinine, Ser: 0.87 mg/dL (ref 0.4–1.2)
GFR calc non Af Amer: >60 mL/min (ref >60)
Glucose, Bld: 112 mg/dL — ABNORMAL HIGH (ref 70–99)
Glucose, Bld: 106 mg/dL — ABNORMAL HIGH (ref 70–99)
Potassium: 3.7 mEq/L (ref 3.5–5.1)
Potassium: 3.9 mEq/L (ref 3.5–5.1)
Sodium: 146 mEq/L — ABNORMAL HIGH (ref 135–145)

## 2011-04-14 LAB — URINALYSIS, ROUTINE W REFLEX MICROSCOPIC
Bilirubin Urine: NEGATIVE
Nitrite: NEGATIVE
Specific Gravity, Urine: 1.02 (ref 1.005–1.030)
Urobilinogen, UA: 0.2 mg/dL (ref 0.0–1.0)
pH: 5 (ref 5.0–8.0)

## 2011-04-14 LAB — HEPATIC FUNCTION PANEL
ALT: 15 U/L (ref 0–35)
Alkaline Phosphatase: 64 U/L (ref 39–117)
Bilirubin, Direct: 0.1 mg/dL (ref 0.0–0.3)
Indirect Bilirubin: 0.1 mg/dL — ABNORMAL LOW (ref 0.3–0.9)
Total Bilirubin: 0.2 mg/dL — ABNORMAL LOW (ref 0.3–1.2)

## 2011-04-14 LAB — ETHANOL: Alcohol, Ethyl (B): 228 mg/dL — ABNORMAL HIGH (ref 0–10)

## 2011-04-14 LAB — CBC
HCT: 38.7 % (ref 36.0–46.0)
Hemoglobin: 13.2 g/dL (ref 12.0–15.0)
MCV: 88.8 fL (ref 78.0–100.0)
RDW: 13.9 % (ref 11.5–15.5)

## 2011-04-14 LAB — RAPID URINE DRUG SCREEN, HOSP PERFORMED
Barbiturates: NOT DETECTED
Barbiturates: NOT DETECTED
Opiates: NOT DETECTED
Tetrahydrocannabinol: NOT DETECTED

## 2011-04-14 LAB — DIFFERENTIAL
Eosinophils Relative: 4 % (ref 0–5)
Lymphocytes Relative: 43 % (ref 12–46)
Lymphs Abs: 2.3 10*3/uL (ref 0.7–4.0)
Monocytes Absolute: 0.5 10*3/uL (ref 0.1–1.0)

## 2011-04-14 LAB — PREGNANCY, URINE
Preg Test, Ur: NEGATIVE
Preg Test, Ur: NEGATIVE

## 2011-04-15 ENCOUNTER — Encounter: Payer: Self-pay | Admitting: *Deleted

## 2011-04-22 ENCOUNTER — Ambulatory Visit (INDEPENDENT_AMBULATORY_CARE_PROVIDER_SITE_OTHER): Payer: Self-pay | Admitting: *Deleted

## 2011-04-22 DIAGNOSIS — I2699 Other pulmonary embolism without acute cor pulmonale: Secondary | ICD-10-CM

## 2011-04-22 DIAGNOSIS — D6859 Other primary thrombophilia: Secondary | ICD-10-CM

## 2011-04-22 DIAGNOSIS — Z7901 Long term (current) use of anticoagulants: Secondary | ICD-10-CM

## 2011-04-22 MED ORDER — WARFARIN SODIUM 5 MG PO TABS: ORAL_TABLET | ORAL | Status: DC

## 2011-05-06 ENCOUNTER — Encounter: Payer: Self-pay | Admitting: *Deleted

## 2011-05-12 NOTE — Discharge Summary (Signed)
[name], [name]                ACCOUNT NO.:  [id number]   MEDICAL RECORD NO.:  [id number]          PATIENT TYPE:  INP   LOCATION:  9101                          [facility]ACILITY:  [facility]   PHYSICIAN:  Ginger Carne, MD  DATE OF BIRTH:  Feb 09, 1991   DATE OF ADMISSION:  09/17/2008  DATE OF DISCHARGE:  09/22/2008                               DISCHARGE SUMMARY   DISCHARGE DIAGNOSES:  1. Delivery of a viable female infant via primary low transverse      cesarean section for failure to progress with cervical dilation  2. Limited prenatal care.  3. Iron-deficiency anemia with a postpartum hemoglobin of 8.3.  4. A positive HIV antibody test with a negative Western blot test and      undetectable HIV RNA quantitative test.   DISCHARGE MEDICATIONS:  1. Percocet 5/325 one tablet every 4 hours as needed for pain.  2. Motrin 600 mg one tablet every 6 hours as needed for pain.  3. Iron sulfate 325 mg one tablet by mouth twice a day for postpartum      anemia.  4. Colace 100 mg by mouth twice daily as needed for constipation.  5. Prenatal vitamin 1 tablet by mouth daily while breast-feeding.   PERTINENT LABORATORY DATA:  Group B Strep positive.  Urine drug screen  is negative.  Hepatitis B surface antigen negative.  RPR nonreactive.  Rubella immune.  Discharge hemoglobin is 8.3.  Urinalysis is negative.   PROCEDURE:  A primary low transverse cesarean section was performed on  September 19, 2008.  Please see dictated operative note for full  details.   HOSPITAL COURSE:  This is an 20 year old gravida 1, para 1 white female  who presented to maternity admissions unit with abdominal pain with an  intrauterine pregnancy at 41 weeks.  She was admitted for induction of  labor for postdates.  She was then induced with a Foley bulb, Pitocin,  and Cytotec, however, failed to fully dilate her cervix.  Therefore, she  was taken for primary low transverse cesarean section that resulted in  delivery of a  viable female infant with Apgars of 8 at one minute and 8 at  five minutes.  He weighed 7 pounds 1 ounce.  There were no complications  from this procedure and she underwent a routine postpartum course.  Of  note, her initial HIV  screening antibody test came back positive, however, the Western blot  confirmation was negative as well as undetectable levels of HIV RNA  quantitative tests.  The patient is currently breast-feeding.  She plans  Depo-Provera for contraception and will follow ut at the [facility] Department for her 6-week postpartum check.      Sylvan Cheese, M.D.  Electronically Signed      Ginger Carne, MD  Electronically Signed    MJ/MEDQ  D:  09/22/2008  T:  09/22/2008  Job:  [id number]

## 2011-05-12 NOTE — Op Note (Signed)
[name], [name]                ACCOUNT NO.:  [id number]   MEDICAL RECORD NO.:  [id number]          PATIENT TYPE:  INP   LOCATION:  9101                          [facility]   PHYSICIAN:  Norton Blizzard, MD    DATE OF BIRTH:  [birth year]   DATE OF PROCEDURE:  09/19/2008  DATE OF DISCHARGE:                               OPERATIVE REPORT   PREOPERATIVE DIAGNOSES:  1. Intrauterine pregnancy at 41-1/7th weeks' gestation.  2. Failed induction of labor for post dates.  3. Failure of cervical dilatation/failure to progress.   POSTOPERATIVE DIAGNOSES:  1. Intrauterine pregnancy at 41-1/7th weeks' gestation.  2. Failed induction of labor for post dates.  3. Failure of cervical dilatation/failure to progress.   PROCEDURE:  Primary low-transverse cesarean section via Pfannenstiel  incision.   SURGEON:  Norton Blizzard, MD.   ANESTHESIA:  Epidural.   IV FLUIDS:  2400 mL of lactated Ringer's.   ESTIMATED BLOOD LOSS:  700 mL.   URINE OUTPUT:  800 mL of clear yellow urine.   INDICATIONS:  The patient is an 21 year old gravida 1 at 41-1/7th weeks'  gestation who was undergoing induction of labor for post dates.  The  patient's induction started on September 17, 2008, with Cytotec, Foley  bulb, and low-dose Pitocin per protocol.  The patient was noted to  advance to 6 cm of cervical dilatation, but was noted to have cervical  edema, labial edema and molding of the fetal head.  Cervical dilatation  halted at 6 cm, she did not make any further change in 2 hours and  actually, her cervix was noted to be progressively more swollen and  cervical dilatation was also noted to measure about 3-4 cm due to the  cervical edema.  An intrauterine pressure catheter was placed, which  documented adequate contractions over an hour, but no further cervical  change.  The patient was counseled regarding failure of cervical  dilatation and cesarean section was recommended.  The risks of cesarean  section including bleeding, infection, injury to surrounding organs,  need for further procedures including hysterectomy in the event of a  life-threatening bleed were discussed with the patient and written  informed consent was obtained.   FINDINGS:  Viable female infant in cephalic direct occiput posterior  position.  Apgars were 8 and 8, weight 7 pounds 1 ounce.  There was  spontaneous placental delivery intact with 3-vessel cord.  Normal  uterus, bilateral adnexae, and clear amniotic fluid.   SPECIMENS:  Placenta was sent to labor and delivery.   COMPLICATIONS:  None.   PROCEDURE IN DETAIL:  The patient received preoperative IV antibiotics  prior to incision.  She was taken to the operating room where she was  laid in the dorsal supine position with a leftward tilt.  Epidural  anesthesia was dosed up to a surgical level and she was prepped and  draped in a sterile manner.  A Pfannenstiel incision was made with  scalpel and carried through to the underlying layer of fascia.  The  fascia was incised in the midline and the fascial  incision was extended  bilaterally using Mayo scissors.  Kochers were applied to the superior  aspect of the fascial incision and the underlying rectus muscles were  dissected off bluntly and sharply.  A similar process was carried out on  the inferior aspect of fascial incision.  The rectus muscles were  separated in the midline bluntly and the peritoneum was entered bluntly.  This peritoneal incision was extended superiorly and inferiorly with  good visualization of bowel and bladder.  Attention was then turned to  the lower uterine segment where a vesicouterine peritoneum was  identified and incised with Metzenbaum until a bladder flap was created.  A transverse hysterotomy was then made using the scalpel and extended  bilaterally in a blunt fashion.  The infant's head was encountered and  the infant was delivered in an atraumatic fashion.  The cord was  clamped  and cut, and the infant was handed over to the awaiting neonatologists.  There was spontaneous placental delivery, intact with 3-vessel cord.  The uterus was then cleared of all clot and debris.  The hysterotomy was  repaired using 0-Monocryl in a running interlocked fashion.  Overall,  good hemostasis was noted.  Adnexae were examined bilaterally and found  to be normal.  The pelvis and the gutters were then cleared of all clots  and debris, and hemostasis was reconfirmed on all surfaces.  The rectus  muscles and the peritoneum were then reapproximated in a mass fashion  using 2 interrupted stitches of 0 Monocryl.  The fascia was  reapproximated using 0 Vicryl in a running fashion, and the skin was  reapproximated using 4-0 Vicryl in a subcuticular stitch.  The patient  tolerated the procedure well.  Sponge, instrument, and needle counts  were correct x 3.  She was taken to the recovery room in awake and a  stable condition.      Norton Blizzard, MD  Electronically Signed     UAD/MEDQ  D:  09/19/2008  T:  09/19/2008  Job:  [id number]
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
## 2011-05-15 NOTE — H&P (Signed)
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 The patient had ingested an unknown number of unidentified nerve  pills from a friend at school that were brown in color. She also took some  Tylenol though her Tylenol blood level was insignificant. The patient had  the intent to kill herself and reported that she had had undisclosed  previous suicide attempts. The patient attributes her greatest reason for  dying to conflictual relationships with the family especially mother's  problems. Mother and sisters have had multiple suicide attempts in the past.   HISTORY OF PRESENT ILLNESS:  The patient describes progressive depressive  illness over the last several months. She has had a 10-15 pounds weight  loss, though she states maybe this is from diet or exercise. She is feeling  progressively lonely as though nobody is there to turn to or that loves her.  She hears her name being called when she lays in her bed alone at night. She  has morbid thoughts and fixations. She has crying spells and tends to  avoidantly withdraw. She tends to cope by associating with others in either  substance use or mature activities. She has been pregnant with her boyfriend  but states she miscarried. She states she has bisexual activity at times.  She has used cannabis every weekend and alcohol  occasionally. She has used 4  Valium around Christmas time and opiates twice. She smokes a pack per day of  cigarettes for the last year. The patient has had no counseling or  psychotropic medication in the past. The patient resides with mother and  mother's friend and suggests that mother is still using crack and alcohol.  Mother herself states she is on Wellbutrin. Mother wants the patient to get  better though the patient seems to perceive that it will be very difficult  for mother to change and she almost hesitates to ask because then she will  be disappointed. The patient in some ways is testing whether mother loves  her but she does not talk to mother about these things. The patient is now  asking for help and does care about her family. She certainly has the  capacity to participate in treatment in a way that can produce effective  change. She seems to have some longstanding anxiety that is likely avoidant  in structure  associated with being the child of substance abusing parent.  The patient does not recognize such patterns and tends to try to not think  about them. She does not acknowledge  other specific anxieties. She does not  have obsessive-compulsive symptoms or definite social anxiety. However she  starts missing mother after she arrives at the hospital and in ways just  wants to go home. She therefore has very self-defeating constructs in her  symptom formation that sustain rather than facilitating resolution of the  problem. The patient does have a history of asthma as well that likely has  some emotional triggers. She has reduced her overall use of asthma  medications while still stating that she requires hospitalization almost  yearly for asthma.   PAST MEDICAL HISTORY:  The patient has a yearly hospitalization for asthma.  She is currently taking her Advair 100/50 Diskus as 1 puff every morning  instead of 1 puff b.i.d. as the prescription is labeled. She uses  Singulair  10 milligrams every morning instead of bedtime. She had a fracture of the  right clavicle at age 77. She had sutures in her occipital scalp at age  9. She miscarried from a pregnancy with her boyfriend. She reports now  that she is bisexual. She has some chronic mechanical low back pain that is  not otherwise defined in origin. Last menses was January 2007 and she is  sexually active. She has no medication allergies. She has had no seizure or  syncope. She has had no heart murmur or arrhythmia.   REVIEW OF SYSTEMS:  The patient denies difficulty with gait, gaze or  continence at this time. She denies exposure to communicable disease or  toxins. She denies rash, jaundice or purpura. There is no chest pain,  palpitations or presyncope. There is no abdominal pain, nausea, vomiting or  diarrhea. There is no dysuria or arthralgia.   Immunizations are up-to-date.   FAMILY HISTORY:  The patient lives with mother and mother's friend. Mother  is on Wellbutrin and apparently still uses alcohol and crack. Mother has had  past suicide attempts. Apparently older sister has had suicide attempts but  no longer lives at home. The patient is apparently due in court February 04, 2006 to testify about a person holding a gun to mother's head in their home.   SOCIAL AND DEVELOPMENTAL HISTORY:  The patient is an eighth grade student at  [employer] and therefore is chronologically behind in  her schooling. She cannot immediately clarify how she got behind  chronologically. She does state that she cannot concentrate or focus and she  is not doing well in school overall. She does not acknowledge other legal  charges herself but does have to testify in  court February 04, 2006 about  person holding a gun to mother's head in their home. She remains sexually  active and indicates that she is bisexual as well. She uses cigarettes a pack per day for the last 4 years and has used  cannabis, alcohol,Valium and  opiates at various times though cannabis appears to be the drug of choice,  being used every weekend.   ASSETS:  The patient is asking for help and does care about family.   MENTAL STATUS EXAM:  Height is 65-1/2 inches and weight is 143 pounds. Blood  pressure is 114/77 with heart rate of 89 sitting and 103/70 with heart rate  of 95 standing. She is right-handed. She is alert and oriented with speech  intact though she offers a paucity of spontaneous verbal elaboration to  questions. Cranial nerves are intact. Alternating motion rates are 0/0.  Muscle strength and tone are normal. There  are no pathologic reflexes or  soft neurologic findings. There are no abnormal involuntary movements. Gait  and gaze are intact. The patient speaks quietly and is manifesting  significant avoidant anxiety. This appears to be chronic and she does not  have other specific anxiety. She seems to have severe dysphoria and a  pattern of several month history of major depression if not longer. She has  had weight loss, guilty ruminations, hopelessness and suicide fixations. The  patient has identity diffusion and confusion. She has no psychotic or manic  features. She does not acknowledge post-traumatic flashbacks or re-  experiencing. She has no dissociative symptoms. She has suicidal ideation  and now the suicide attempt. She complains of difficulty concentrating and  focusing her attention but is depressed.   IMPRESSION:  AXIS I:  1.  Major depression, single episode, severe with melancholic features.  2.  Anxiety disorder not otherwise specified with avoidant features.  3.  Rule out attention deficit hyperactivity disorder, predominately      inattentive type (provisional diagnosis).  4.  Psychoactive substance abuse not otherwise specified.  5.  Parent child problem.  6.  Other specified family circumstances.  7.  Other interpersonal problem,  AXIS II:  Rule out learning  disorder not otherwise specified (provisional  diagnosis).  AXIS III:  1.  Mixed overdose with acetaminophen and unidentified nerve pills from      another student at school.  2.  Asthma.  3.  Weight loss.  4.  Chronic mechanical low back pain.  5.  Cigarette smoking.  AXIS IV:  Stressors family severe to extreme acute and chronic; school  moderate acute and chronic; phase of life severe acute and chronic.  AXIS V:  Global assessment of functioning on admission 28 with highest in  last year estimated at 72   PLAN:  The patient is admitted for inpatient adolescent psychiatric and  multidisciplinary multimodal behavioral health treatment in a team-based  program at a locked psychiatric unit. Will start Wellbutrin pharmacotherapy.  Cognitive behavioral therapy, anger management, substance abuse  intervention, child of alcoholic and [employer] type interventions, family  therapy, communication and social skills, grief for miscarriage and learning based strategies can be undertaken. Estimated length of stay is 7-9 days  with target symptoms for discharge being stabilization of suicide risk and  mood, stabilization of avoidant anxiety and its undermining of treatment  efficacy and completion and generalization of the capacity for safe  effective participation in future outpatient treatment.      Lalla Brothers, MD  Electronically Signed     GEJ/MEDQ  D:  01/28/2006  T:  01/28/2006  Job:  [id number]

## 2011-07-29 IMAGING — CT CT ANGIO CHEST
1 of 7 series · 4 of 36 positions shown · IV contrast (Omnipaque 300)
Comparison: Plain film of earlier in the morning.  CT 09/06/2010.

CLINICAL DATA: History of pulmonary embolism.  Off Coumadin.
Hemoptysis.

CT ANGIOGRAPHY OF THE CHEST
TECHNIQUE: Multidetector CT angiography of the chest was performed
after contrast with bolus timed to evaluate the pulmonary arteries.
Multiplanar CT image reconstructions including MIPs were obtained
to evaluate the vascular anatomy.
Contrast:  100  ml 9mnipaque-T66

[Series 4: pe 3.0 b40f · axial · 0.59mm/px · z∈[-241,-79]mm · 4 of 91 slices shown]
[im 19/91  lung]
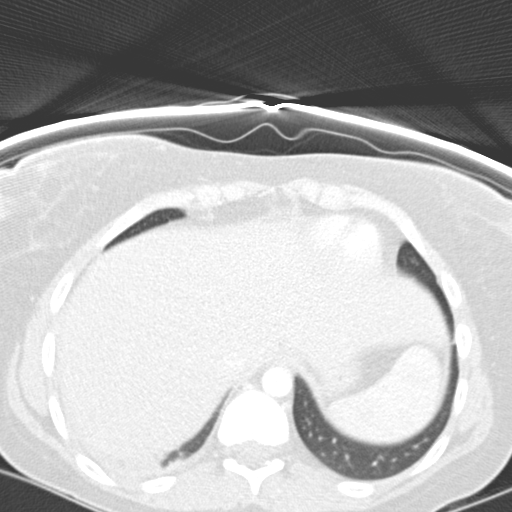
[im 37/91  mediastinal]
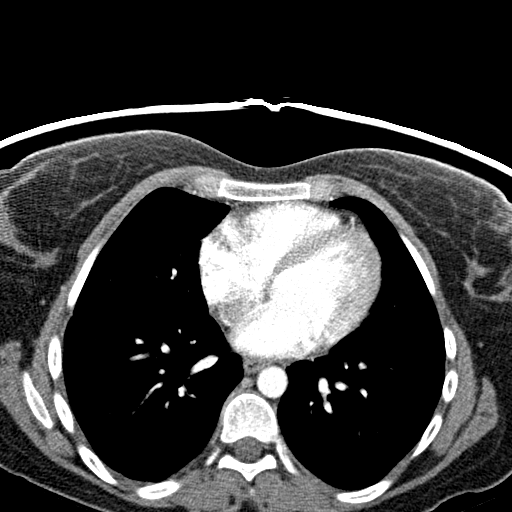
[im 55/91  lung]
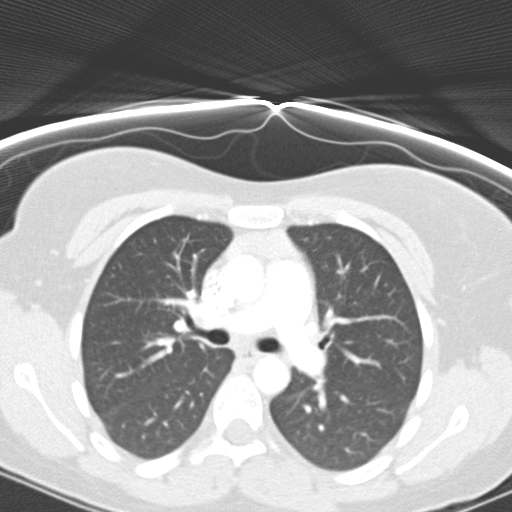
[im 73/91  mediastinal]
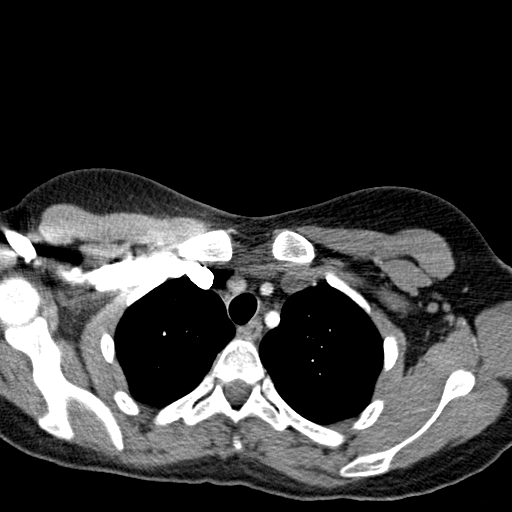

[4 of 36 positions shown; findings below may reference images not displayed]

FINDINGS: Lung windows demonstrate mild right base subsegmental
atelectasis.

Soft tissue windows:  The quality of this exam for evaluation of
pulmonary embolism is moderate.  The bolus is mildly suboptimally
timed.  No evidence of pulmonary embolism to the segmental level.
Apparent hypoenhancement within the right upper lobe pulmonary
artery branch including on image 114 of  series 8 is favored to be
due to mixing and bolus timing.

Normal aortic caliber without dissection.  Normal heart size.
Trace right pleural fluid.

Borderline right hilar adenopathy is unchanged.  No mediastinal
adenopathy.  Probable residual thymus in the anterior mediastinum.
This is unchanged.

Limited abdominal imaging demonstrates no significant findings.

 Review of the MIP images confirms the above findings.
IMPRESSION: 1.  No evidence of pulmonary embolism to the segmental level.
2.  Mild right base atelectasis with trace right pleural fluid.
This is improved since the prior CT.

## 2011-08-28 ENCOUNTER — Emergency Department (HOSPITAL_COMMUNITY)
Admission: EM | Admit: 2011-08-28 | Discharge: 2011-08-28 | Disposition: A | Discharge status: Home / Self Care | Payer: Self-pay | Attending: Emergency Medicine | Admitting: Emergency Medicine

## 2011-08-28 ENCOUNTER — Emergency Department (HOSPITAL_COMMUNITY): Payer: Self-pay

## 2011-08-28 ENCOUNTER — Encounter (HOSPITAL_COMMUNITY): Payer: Self-pay | Admitting: Emergency Medicine

## 2011-08-28 DIAGNOSIS — J189 Pneumonia, unspecified organism: Secondary | ICD-10-CM | POA: Insufficient documentation

## 2011-08-28 DIAGNOSIS — Z7901 Long term (current) use of anticoagulants: Secondary | ICD-10-CM | POA: Insufficient documentation

## 2011-08-28 DIAGNOSIS — F1910 Other psychoactive substance abuse, uncomplicated: Secondary | ICD-10-CM | POA: Insufficient documentation

## 2011-08-28 DIAGNOSIS — Z21 Asymptomatic human immunodeficiency virus [HIV] infection status: Secondary | ICD-10-CM | POA: Insufficient documentation

## 2011-08-28 DIAGNOSIS — D649 Anemia, unspecified: Secondary | ICD-10-CM | POA: Insufficient documentation

## 2011-08-28 DIAGNOSIS — Z86718 Personal history of other venous thrombosis and embolism: Secondary | ICD-10-CM | POA: Insufficient documentation

## 2011-08-28 DIAGNOSIS — J45909 Unspecified asthma, uncomplicated: Secondary | ICD-10-CM | POA: Insufficient documentation

## 2011-08-28 DIAGNOSIS — Z8674 Personal history of sudden cardiac arrest: Secondary | ICD-10-CM | POA: Insufficient documentation

## 2011-08-28 DIAGNOSIS — Z87891 Personal history of nicotine dependence: Secondary | ICD-10-CM | POA: Insufficient documentation

## 2011-08-28 LAB — CBC
HCT: 35.4 % — ABNORMAL LOW (ref 36.0–46.0)
Hemoglobin: 11.9 g/dL — ABNORMAL LOW (ref 12.0–15.0)
WBC: 7.5 10*3/uL (ref 4.0–10.5)

## 2011-08-28 LAB — DIFFERENTIAL
Lymphocytes Relative: 20 % (ref 12–46)
Lymphs Abs: 1.5 10*3/uL (ref 0.7–4.0)
Monocytes Absolute: 0.7 10*3/uL (ref 0.1–1.0)
Monocytes Relative: 10 % (ref 3–12)
Neutro Abs: 4.9 10*3/uL (ref 1.7–7.7)

## 2011-08-28 LAB — URINE MICROSCOPIC-ADD ON

## 2011-08-28 LAB — BASIC METABOLIC PANEL
Chloride: 103 mEq/L (ref 96–112)
Creatinine, Ser: 0.6 mg/dL (ref 0.50–1.10)
GFR calc Af Amer: >60 mL/min (ref >60)
Potassium: 3.5 mEq/L (ref 3.5–5.1)

## 2011-08-28 LAB — URINALYSIS, ROUTINE W REFLEX MICROSCOPIC
Ketones, ur: NEGATIVE mg/dL
Leukocytes, UA: NEGATIVE
Nitrite: NEGATIVE
pH: 7.5 (ref 5.0–8.0)

## 2011-08-28 LAB — PROTIME-INR: INR: 1.06 (ref 0.00–1.49)

## 2011-08-28 LAB — D-DIMER, QUANTITATIVE: D-Dimer, Quant: 2.61 ug/mL-FEU — ABNORMAL HIGH (ref 0.00–0.48)

## 2011-08-28 MED ORDER — HYDROCODONE-ACETAMINOPHEN 5-325 MG PO TABS
1.0000 | ORAL_TABLET | Freq: Once | ORAL | Status: AC
  Administered 2011-08-28: 1 via ORAL
  Filled 2011-08-28: qty 1

## 2011-08-28 MED ORDER — ALBUTEROL SULFATE (5 MG/ML) 0.5% IN NEBU: 2.5000 mg | INHALATION_SOLUTION | Freq: Once | RESPIRATORY_TRACT | Status: DC

## 2011-08-28 MED ORDER — ALBUTEROL SULFATE (5 MG/ML) 0.5% IN NEBU: 5.0000 mg | INHALATION_SOLUTION | Freq: Four times a day (QID) | RESPIRATORY_TRACT | Status: DC | PRN

## 2011-08-28 MED ORDER — ALBUTEROL SULFATE (5 MG/ML) 0.5% IN NEBU
5.0000 mg | INHALATION_SOLUTION | Freq: Once | RESPIRATORY_TRACT | Status: AC
  Administered 2011-08-28: 5 mg via RESPIRATORY_TRACT

## 2011-08-28 MED ORDER — AZITHROMYCIN 250 MG PO TABS: 250.0000 mg | ORAL_TABLET | Freq: Every day | ORAL | Status: AC

## 2011-08-28 MED ORDER — IOHEXOL 350 MG/ML SOLN
80.0000 mL | Freq: Once | INTRAVENOUS | Status: AC | PRN
  Administered 2011-08-28: 64 mL via INTRAVENOUS

## 2011-08-28 MED ORDER — METHYLPREDNISOLONE SODIUM SUCC 125 MG IJ SOLR
125.0000 mg | Freq: Once | INTRAMUSCULAR | Status: AC
  Administered 2011-08-28: 125 mg via INTRAVENOUS
  Filled 2011-08-28: qty 2

## 2011-08-28 MED ORDER — HYDROCODONE-ACETAMINOPHEN 5-325 MG PO TABS: 1.0000 | ORAL_TABLET | Freq: Four times a day (QID) | ORAL | Status: AC | PRN

## 2011-08-28 MED ORDER — ALBUTEROL SULFATE (5 MG/ML) 0.5% IN NEBU
5.0000 mg | INHALATION_SOLUTION | Freq: Once | RESPIRATORY_TRACT | Status: AC
  Administered 2011-08-28: 5 mg via RESPIRATORY_TRACT
  Filled 2011-08-28: qty 1

## 2011-08-28 MED ORDER — ALBUTEROL SULFATE (5 MG/ML) 0.5% IN NEBU
INHALATION_SOLUTION | RESPIRATORY_TRACT | Status: AC
  Administered 2011-08-28: 5 mg via RESPIRATORY_TRACT
  Filled 2011-08-28: qty 1

## 2011-08-28 NOTE — ED Provider Notes (Signed)
History   Chart scribed for Anna Lennert, MD by [name]; the patient was seen in room APA04/APA04; this patient's care was started at 10:48 AM.    CSN: [id number] Arrival date & time: 08/28/2011 10:26 AM  Chief Complaint  Patient presents with  . Hemoptysis   HPI [name] is a 20 y.o. female who presents to the Emergency Department complaining of cough. Pt reports cough x 3 days with occasional bright red blood in sputum since yesterday, associated with congestion, sore throat, body aches, nausea, and subjective fever. Pt reports h/o PE last year, coumadin but stopped 7 months ago because no money. No chest pain or leg swelling. Pt quit smoking 1 month ago. Also with separate c/o mild dysuria and back pain, no hematuria or abd pain.   Past Medical History  Diagnosis Date  . Pulmonary embolism 06/2010    Multiple bilateral pulmonary emboli diagnosed by CT scan in in 06/2010.  Moderate size pleural effusion and right lower lobe infarction present at that time.  Lupus anticoagulant was present, but all other aspects of her hypercoagulable evaluation were negative including all Cardiolite and antibodies.    . Anticoagulant long-term use   . Asthma     ABG in 11/2010:7.45, 34, 79.  . Lupus anticoagulant disorder     Previously followed by Dr. Anastasia Russell of discharged from his care due to medical noncompliance.  Marland Kitchen HIV positive     Western Blot negative  . Tobacco abuse   . Anemia   . Substance abuse     cocaine, opiates, marijuana; alcohol; most recent positive test for cocaine was in December of 2011; alcohol levels have been as high as 302  . Anxiety and depression     suicide attempt-age 43  . Attention deficit hyperactivity disorder     Past Surgical History  Procedure Date  . Cesarean section 2011    Family History  Problem Relation Age of Onset  . Drug abuse Mother     History   . Drug abuse Father   . Heart attack Father 62    H/o drug abuse     History  Substance Use Topics  . Smoking status: Former Smoker -- 0.5 packs/day    Types: Cigarettes    Quit date: 06/27/2011  . Smokeless tobacco: Never Used  . Alcohol Use: No     former alcohol abuse    OB History    Grav Para Term Preterm Abortions TAB SAB Ect Mult Living                 Previous Medications   FERROUS SULFATE 325 (65 FE) MG EC TABLET    Take 325 mg by mouth 3 (three) times daily with meals.     LEVALBUTEROL (XOPENEX) 1.25 MG/0.5ML NEBULIZER SOLUTION    Take 1 ampule by nebulization every 4 (four) hours as needed. Wheezing and shortness of breath   WARFARIN (COUMADIN) 5 MG TABLET    Taking coumadin 15mg  daily     Allergies as of 08/28/2011  . (No Known Allergies)     Review of Systems  Constitutional: Positive for fever. Negative for fatigue.  HENT: Positive for sore throat. Negative for congestion, trouble swallowing, sinus pressure and ear discharge.   Eyes: Negative for discharge.  Respiratory: Positive for cough. Negative for shortness of breath.   Cardiovascular: Negative for chest pain and leg swelling.  Gastrointestinal: Positive for nausea. Negative for vomiting, abdominal pain and diarrhea.  Genitourinary: Positive  for dysuria. Negative for frequency, hematuria and flank pain.  Musculoskeletal: Positive for myalgias and back pain.  Skin: Negative for rash.  Neurological: Negative for seizures and headaches.  Hematological: Negative.   Psychiatric/Behavioral: Negative for hallucinations.    Physical Exam  BP 115/63  Pulse 83  Temp(Src) 98.1 F (36.7 C) (Oral)  Resp 20  Ht 5\' 7"  (1.702 m)  Wt 200 lb (90.719 kg)  BMI 31.32 kg/m2  SpO2 95%  LMP 07/28/2011  Physical Exam  Constitutional: She is oriented to person, place, and time. She appears well-developed.  HENT:  Head: Normocephalic and atraumatic.       Right tonsil enlarged; nasal congestion  Eyes: Conjunctivae and EOM are normal. No scleral icterus.  Neck: Neck supple. No  thyromegaly present.  Cardiovascular: Normal rate and regular rhythm.  Exam reveals no gallop and no friction rub.   No murmur heard. Pulmonary/Chest: No stridor. She has wheezes (mild bilateral). She has no rales. She exhibits no tenderness.  Abdominal: She exhibits no distension. There is no tenderness. There is no rebound.       Right flank tenderness  Musculoskeletal: Normal range of motion. She exhibits no edema.  Lymphadenopathy:    She has no cervical adenopathy.  Neurological: She is oriented to person, place, and time. Coordination normal.  Skin: Skin is warm and dry. No rash noted. No erythema.  Psychiatric: She has a normal mood and affect. Her behavior is normal.    ED Course  Procedures  OTHER DATA REVIEWED: Nursing notes and vital signs reviewed. Prior records reviewed.   LABS / RADIOLOGY: Results for orders placed during the hospital encounter of 08/28/11  D-DIMER, QUANTITATIVE      Component Value Range   D-Dimer, Quant 2.61 (*) 0.00 - 0.48 (ug/mL-FEU)  CBC      Component Value Range   WBC 7.5  4.0 - 10.5 (K/uL)   RBC 4.13  3.87 - 5.11 (MIL/uL)   Hemoglobin 11.9 (*) 12.0 - 15.0 (g/dL)   HCT 16.1 (*) 09.6 - 46.0 (%)   MCV 85.7  78.0 - 100.0 (fL)   MCH 28.8  26.0 - 34.0 (pg)   MCHC 33.6  30.0 - 36.0 (g/dL)   RDW 04.5  40.9 - 81.1 (%)   Platelets 344  150 - 400 (K/uL)  DIFFERENTIAL      Component Value Range   Neutrophils Relative 66  43 - 77 (%)   Neutro Abs 4.9  1.7 - 7.7 (K/uL)   Lymphocytes Relative 20  12 - 46 (%)   Lymphs Abs 1.5  0.7 - 4.0 (K/uL)   Monocytes Relative 10  3 - 12 (%)   Monocytes Absolute 0.7  0.1 - 1.0 (K/uL)   Eosinophils Relative 4  0 - 5 (%)   Eosinophils Absolute 0.3  0.0 - 0.7 (K/uL)   Basophils Relative 1  0 - 1 (%)   Basophils Absolute 0.1  0.0 - 0.1 (K/uL)  BASIC METABOLIC PANEL      Component Value Range   Sodium 137  135 - 145 (mEq/L)   Potassium 3.5  3.5 - 5.1 (mEq/L)   Chloride 103  96 - 112 (mEq/L)   CO2 23  19 -  32 (mEq/L)   Glucose, Bld 103 (*) 70 - 99 (mg/dL)   BUN 6  6 - 23 (mg/dL)   Creatinine, Ser 9.14  0.50 - 1.10 (mg/dL)   Calcium 9.0  8.4 - 78.2 (mg/dL)   GFR calc non Af  Amer >60  >60 (mL/min)   GFR calc Af Amer >60  >60 (mL/min)  URINALYSIS, ROUTINE W REFLEX MICROSCOPIC      Component Value Range   Color, Urine YELLOW  YELLOW    Appearance CLEAR  CLEAR    Specific Gravity, Urine 1.020  1.005 - 1.030    pH 7.5  5.0 - 8.0    Glucose, UA NEGATIVE  NEGATIVE (mg/dL)   Hgb urine dipstick TRACE (*) NEGATIVE    Bilirubin Urine NEGATIVE  NEGATIVE    Ketones, ur NEGATIVE  NEGATIVE (mg/dL)   Protein, ur NEGATIVE  NEGATIVE (mg/dL)   Urobilinogen, UA 0.2  0.0 - 1.0 (mg/dL)   Nitrite NEGATIVE  NEGATIVE    Leukocytes, UA NEGATIVE  NEGATIVE   RAPID STREP SCREEN      Component Value Range   Streptococcus, Group A Screen (Direct) NEGATIVE  NEGATIVE   PROTIME-INR      Component Value Range   Prothrombin Time 14.0  11.6 - 15.2 (seconds)   INR 1.06  0.00 - 1.49   POCT PREGNANCY, URINE      Component Value Range   Preg Test, Ur POSITIVE    URINE MICROSCOPIC-ADD ON      Component Value Range   Squamous Epithelial / LPF RARE  RARE    WBC, UA 3-6  <3 (WBC/hpf)   RBC / HPF 0-2  <3 (RBC/hpf)   Bacteria, UA FEW (*) RARE    Urine-Other MUCOUS PRESENT     Dg Chest 1 View  08/28/2011  *RADIOLOGY REPORT*  Clinical Data: Hemoptysis, cough, fever, nausea, runny nose, headache, history asthma, pulmonary embolism, tobacco abuse, HIV  CHEST - 1 VIEW  Comparison: 01/31/2011  Findings: Normal heart size, mediastinal contours and pulmonary vascularity. Poor definition of right heart border with depression of the minor fissure and increased right middle lobe opacity question atelectasis and/or infiltrate. Scattered peribronchial thickening. Remaining lungs clear. No pleural effusion or pneumothorax.  IMPRESSION: Chronic bronchitic changes with atelectasis and/or consolidation in right middle lobe.  Original Report  Authenticated By: Lollie Marrow, M.D.   Ct Angio Chest W/cm &/or Wo Cm  08/28/2011  *RADIOLOGY REPORT*  Clinical Data:  Short shortness of breath, chest pain, hemoptysis, history of pulmonary embolism, pregnant  CT ANGIOGRAPHY CHEST WITH CONTRAST  Technique:  Multidetector CT imaging of the chest was performed using the standard protocol during bolus administration of intravenous contrast.  Multiplanar CT image reconstructions including MIPs were obtained to evaluate the vascular anatomy.  Contrast:  64 ml Omnipaque-300 IV  Comparison:  11/28/2010  Findings: Aorta normal caliber without aneurysm or dissection. Pulmonary arteries patent. No evidence of pulmonary embolism. Visualized portions of liver and spleen unremarkable. Single minimally enlarged lymph node at superior aspect of right pulmonary hilum, 2.1 x 1.5 cm series 8 image 131 minimally increased. Minimal patchy infiltrate in the right lower lobe less right upper and right middle lobes. Right middle lobe atelectasis with volume loss. Remaining lungs clear. Scattered peribronchial thickening. No pleural effusion or pneumothorax. No acute osseous findings.  Review of the MIP images confirms the above findings.  IMPRESSION: No evidence of pulmonary embolism. Single minimally enlarged nonspecific right hilar lymph node slightly increased since previous study. Minimal patchy infiltrate right lower lobe, less at right middle lobe and right upper lobe, could be related to pulmonary infiltrate or hemorrhage. Right middle lobe atelectasis.  Original Report Authenticated By: Lollie Marrow, M.D.     ED COURSE: 12:27 PM - Results discussed,  pt reports mild right mid chest pain and right mid thoracic back pain with breathing; states pain with past PE was different (knife-like) in her back with associated hemoptysis. Reports LNMP 4 weeks ago.  12:35 PM - Case discussed with radiologist, recommends CT chest to r/o PE   2:15 PM - Informed by nurse pt c/o  worsening chest pain.  2:53 PM - All results discussed, pt feeling better, comfortable with discharge and PCP f/u.  MDM: pe rule out ,  Dx pneumonia  IMPRESSION: 1. Pneumonia, organism unspecified      PLAN: Discharge All results reviewed and discussed with pt, questions answered, pt agreeable with plan.   CONDITION ON DISCHARGE: Stable   MEDS GIVEN IN ED: HYDROcodone-acetaminophen (NORCO) 5-325 MG per tablet 1 tablet (1 tablet Oral Given 08/28/11 1121)  albuterol (PROVENTIL) (5 MG/ML) 0.5% nebulizer solution 5 mg (5 mg Nebulization Given 08/28/11 1237)  iohexol (OMNIPAQUE) 350 MG/ML injection 80 mL (64 mL Intravenous Contrast Given 08/28/11 1306)  HYDROcodone-acetaminophen (NORCO) 5-325 MG per tablet 1 tablet (1 tablet Oral Given 08/28/11 1441)     DISCHARGE MEDICATIONS: New Prescriptions   ALBUTEROL (PROVENTIL) (5 MG/ML) 0.5% NEBULIZER SOLUTION    Take 5 mg by nebulization every 6 (six) hours as needed for wheezing.   AZITHROMYCIN (ZITHROMAX) 250 MG TABLET    Take 1 tablet (250 mg total) by mouth daily. Take first 2 tablets together, then 1 every day until finished.   HYDROCODONE-ACETAMINOPHEN (NORCO) 5-325 MG PER TABLET    Take 1 tablet by mouth every 6 (six) hours as needed for pain.     SCRIBE ATTESTATION: The chart was scribed for me under my direct supervision.  I personally performed the history, physical, and medical decision making and all procedures in the evaluation of this patient.Anna Lennert, MD 08/28/11 [phone]

## 2011-08-28 NOTE — ED Notes (Signed)
Positive pregnancy test. MD aware and still request Chest xray with belly shield.

## 2011-08-28 NOTE — ED Notes (Signed)
Pt states that pain is better,

## 2011-08-28 NOTE — ED Notes (Signed)
Pt c/o pain again in chest with inspiration/expiration. Audible wheezes heard. New orders received.

## 2011-08-28 NOTE — ED Notes (Signed)
Patient with c/o cough x 3 days. Patient reports coughing up small amount of bright red blood x 2 days. +fever at home. +nausea. Patient tearful in triage.

## 2011-09-16 LAB — URINE MICROSCOPIC-ADD ON

## 2011-09-16 LAB — URINALYSIS, ROUTINE W REFLEX MICROSCOPIC
Leukocytes, UA: NEGATIVE
Nitrite: NEGATIVE
Protein, ur: NEGATIVE
Urobilinogen, UA: 0.2

## 2011-09-28 LAB — DIFFERENTIAL
Basophils Absolute: 0
Basophils Absolute: 0
Basophils Relative: 0
Eosinophils Absolute: 0.2
Eosinophils Absolute: 0.2
Eosinophils Relative: 2
Lymphs Abs: 2.5
Monocytes Absolute: 1.1 — ABNORMAL HIGH
Monocytes Relative: 10
Neutro Abs: 6.5
Neutrophils Relative %: 68

## 2011-09-28 LAB — CBC
HCT: 30.7 — ABNORMAL LOW
Hemoglobin: 10.4 — ABNORMAL LOW
Hemoglobin: 8.3 — ABNORMAL LOW
MCHC: 33.7
MCV: 91.1
MCV: 92.4
Platelets: 262
Platelets: 235
Platelets: 269
RBC: 3.26 — ABNORMAL LOW
RDW: 14.6
RDW: 14.2
RDW: 14.7

## 2011-09-28 LAB — HIV 1/2 CONFIRMATION
HIV-1 antibody: NEGATIVE
HIV-2 Ab: NEGATIVE

## 2011-09-28 LAB — BASIC METABOLIC PANEL
BUN: 8
Chloride: 105
Glucose, Bld: 80
Potassium: 3.6

## 2011-09-28 LAB — URINALYSIS, ROUTINE W REFLEX MICROSCOPIC
Glucose, UA: NEGATIVE
Hgb urine dipstick: NEGATIVE
Protein, ur: NEGATIVE
pH: 6.5

## 2011-09-28 LAB — RAPID URINE DRUG SCREEN, HOSP PERFORMED
Amphetamines: NOT DETECTED
Barbiturates: NOT DETECTED
Benzodiazepines: NOT DETECTED
Cocaine: NOT DETECTED

## 2011-09-28 LAB — TYPE AND SCREEN: ABO/RH(D): O POS

## 2011-09-28 LAB — HIV-1 RNA QUANT-NO REFLEX-BLD: HIV 1 RNA Quant: <50 copies/mL (ref <50)

## 2011-09-28 LAB — HIV ANTIBODY (ROUTINE TESTING W REFLEX): HIV: REACTIVE — AB

## 2011-09-30 LAB — URINALYSIS, ROUTINE W REFLEX MICROSCOPIC
Ketones, ur: NEGATIVE
Nitrite: NEGATIVE
Protein, ur: NEGATIVE
pH: 6

## 2011-09-30 LAB — CBC
HCT: 30.1 — ABNORMAL LOW
HCT: 30.1 — ABNORMAL LOW
Hemoglobin: 10.1 — ABNORMAL LOW
Hemoglobin: 10.1 — ABNORMAL LOW
MCHC: 33.7
MCV: 91.3
Platelets: 284
RBC: 3.29 — ABNORMAL LOW
WBC: 12.1 — ABNORMAL HIGH

## 2011-09-30 LAB — STREP B DNA PROBE: Strep Group B Ag: POSITIVE

## 2011-09-30 LAB — DIFFERENTIAL
Basophils Relative: 0
Eosinophils Absolute: 0.3
Eosinophils Relative: 2
Lymphocytes Relative: 16
Lymphs Abs: 1.9
Monocytes Absolute: 1.2 — ABNORMAL HIGH
Monocytes Relative: 10
Neutro Abs: 8.7 — ABNORMAL HIGH

## 2011-09-30 LAB — RAPID URINE DRUG SCREEN, HOSP PERFORMED
Benzodiazepines: NOT DETECTED
Tetrahydrocannabinol: NOT DETECTED

## 2011-09-30 LAB — RUBELLA SCREEN: Rubella: 42.7 — ABNORMAL HIGH

## 2011-09-30 LAB — ABO/RH: ABO/RH(D): O POS

## 2011-09-30 LAB — HIV 1/2 CONFIRMATION: HIV-2 Ab: NEGATIVE

## 2011-09-30 LAB — RPR: RPR Ser Ql: NONREACTIVE

## 2011-09-30 LAB — HEPATITIS B SURFACE ANTIGEN: Hepatitis B Surface Ag: NEGATIVE

## 2011-09-30 LAB — WET PREP, GENITAL

## 2011-10-19 ENCOUNTER — Emergency Department (HOSPITAL_COMMUNITY)
Admission: EM | Admit: 2011-10-19 | Discharge: 2011-10-19 | Disposition: A | Discharge status: Home / Self Care | Payer: Medicaid Other | Attending: Emergency Medicine | Admitting: Emergency Medicine

## 2011-10-19 DIAGNOSIS — O210 Mild hyperemesis gravidarum: Secondary | ICD-10-CM | POA: Insufficient documentation

## 2011-10-19 DIAGNOSIS — M329 Systemic lupus erythematosus, unspecified: Secondary | ICD-10-CM | POA: Insufficient documentation

## 2011-10-19 DIAGNOSIS — J45909 Unspecified asthma, uncomplicated: Secondary | ICD-10-CM | POA: Insufficient documentation

## 2011-10-19 LAB — URINE MICROSCOPIC-ADD ON

## 2011-10-19 LAB — URINALYSIS, ROUTINE W REFLEX MICROSCOPIC
Glucose, UA: NEGATIVE mg/dL
Hgb urine dipstick: NEGATIVE
Ketones, ur: >80 mg/dL — AB
Protein, ur: 30 mg/dL — AB
pH: 6.5 (ref 5.0–8.0)

## 2011-10-19 LAB — POCT PREGNANCY, URINE: Preg Test, Ur: NEGATIVE

## 2011-11-04 ENCOUNTER — Telehealth: Payer: Self-pay | Admitting: Cardiology

## 2011-11-04 NOTE — Telephone Encounter (Signed)
Left message for patient to contact us.  Unable to reach her.

## 2011-12-29 HISTORY — PX: HEMANGIOMA EXCISION: SHX1734

## 2012-01-07 ENCOUNTER — Inpatient Hospital Stay (HOSPITAL_COMMUNITY): Payer: Medicaid Other

## 2012-01-07 ENCOUNTER — Inpatient Hospital Stay (HOSPITAL_COMMUNITY)
Admission: AD | Admit: 2012-01-07 | Discharge: 2012-01-07 | Disposition: A | Discharge status: Home / Self Care | Payer: Medicaid Other | Source: Ambulatory Visit | Attending: Obstetrics & Gynecology | Admitting: Obstetrics & Gynecology

## 2012-01-07 ENCOUNTER — Encounter (HOSPITAL_COMMUNITY): Payer: Self-pay

## 2012-01-07 DIAGNOSIS — D689 Coagulation defect, unspecified: Secondary | ICD-10-CM | POA: Insufficient documentation

## 2012-01-07 DIAGNOSIS — O212 Late vomiting of pregnancy: Secondary | ICD-10-CM | POA: Insufficient documentation

## 2012-01-07 DIAGNOSIS — O358XX0 Maternal care for other (suspected) fetal abnormality and damage, not applicable or unspecified: Secondary | ICD-10-CM

## 2012-01-07 DIAGNOSIS — O219 Vomiting of pregnancy, unspecified: Secondary | ICD-10-CM

## 2012-01-07 DIAGNOSIS — D6861 Antiphospholipid syndrome: Secondary | ICD-10-CM

## 2012-01-07 DIAGNOSIS — F1911 Other psychoactive substance abuse, in remission: Secondary | ICD-10-CM

## 2012-01-07 DIAGNOSIS — O0930 Supervision of pregnancy with insufficient antenatal care, unspecified trimester: Secondary | ICD-10-CM | POA: Insufficient documentation

## 2012-01-07 DIAGNOSIS — Z86711 Personal history of pulmonary embolism: Secondary | ICD-10-CM

## 2012-01-07 LAB — RAPID URINE DRUG SCREEN, HOSP PERFORMED
Barbiturates: NOT DETECTED
Benzodiazepines: NOT DETECTED
Cocaine: NOT DETECTED
Opiates: NOT DETECTED

## 2012-01-07 LAB — URINALYSIS, ROUTINE W REFLEX MICROSCOPIC
Leukocytes, UA: NEGATIVE
Nitrite: NEGATIVE
Specific Gravity, Urine: <1.005 — ABNORMAL LOW (ref 1.005–1.030)
Urobilinogen, UA: 0.2 mg/dL (ref 0.0–1.0)
pH: 6.5 (ref 5.0–8.0)

## 2012-01-07 LAB — URINE MICROSCOPIC-ADD ON

## 2012-01-07 MED ORDER — ENOXAPARIN (LOVENOX) PATIENT EDUCATION KIT: 1.0000 | PACK | Freq: Once | Status: DC

## 2012-01-07 MED ORDER — ENOXAPARIN SODIUM 100 MG/ML ~~LOC~~ SOLN: 85.0000 mg | Freq: Two times a day (BID) | SUBCUTANEOUS | Status: DC

## 2012-01-07 MED ORDER — PROMETHAZINE HCL 25 MG PO TABS: 25.0000 mg | ORAL_TABLET | Freq: Four times a day (QID) | ORAL | Status: DC | PRN

## 2012-01-07 MED ORDER — ENOXAPARIN SODIUM 100 MG/ML ~~LOC~~ SOLN
86.0000 mg | Freq: Once | SUBCUTANEOUS | Status: AC
  Administered 2012-01-07: 85 mg via SUBCUTANEOUS
  Filled 2012-01-07: qty 1

## 2012-01-07 MED ORDER — ONDANSETRON HCL 4 MG PO TABS: 4.0000 mg | ORAL_TABLET | Freq: Three times a day (TID) | ORAL | Status: AC | PRN

## 2012-01-07 NOTE — ED Notes (Signed)
Pt asked to go outside for a few minutes(to smoke). Told pt to be back in a few minutes . Will have discharge papers and Rx for her.

## 2012-01-07 NOTE — ED Provider Notes (Signed)
[name] is a 21 y.o. female presenting with concern that her baby is "okay."  She is a G3P2002 who has not received pre-natal care and estimates her LMP to be the end of July.  She has felt fetal movement for the previous 2 months.  Patient has had two prior deliveries by c-section with GBS in the second pregnancy the only previous complication.  She has a history of pulmonary embolism in July 2011 and a positive lupus anticoagulant for which she previously took Coumadin.  She estimates she last took Coumadin in February 2012.  Patient smokes 1/2-1 pack of cigarettes daily and denies alcohol use.   Maternal Medical History:  Reason for admission: Reason for admission: nausea.    OB History    Grav Para Term Preterm Abortions TAB SAB Ect Mult Living   3 2 2  0 0 0 0 0 0 2     Past Medical History  Diagnosis Date  . Pulmonary embolism 06/2010    Multiple bilateral pulmonary emboli diagnosed by CT scan in in 06/2010.  Moderate size pleural effusion and right lower lobe infarction present at that time.  Lupus anticoagulant was present, but all other aspects of her hypercoagulable evaluation were negative including all Cardiolite and antibodies.    . Anticoagulant long-term use   . Asthma     ABG in 11/2010:7.45, 34, 79.  . Lupus anticoagulant disorder     Previously followed by Dr. Anastasia Fiedler of discharged from his care due to medical noncompliance.  Marland Kitchen HIV positive     Western Blot negative  . Tobacco abuse   . Anemia   . Substance abuse     cocaine, opiates, marijuana; alcohol; most recent positive test for cocaine was in December of 2011; alcohol levels have been as high as 302  . Anxiety and depression     suicide attempt-age 51  . Attention deficit hyperactivity disorder    Past Surgical History  Procedure Date  . Cesarean section 2011   Family History: family history includes Drug abuse in her father and mother and Heart attack (age of onset:48) in her father. Social  History:  reports that she has been smoking Cigarettes.  She has been smoking about .5 packs per day. She has never used smokeless tobacco. She reports that she uses illicit drugs (Marijuana). She reports that she does not drink alcohol.  Review of Systems  Constitutional: Negative for fever and chills.  HENT: Negative for sore throat.   Eyes: Negative for blurred vision.  Respiratory: Negative for shortness of breath.   Cardiovascular: Negative for chest pain and leg swelling.  Gastrointestinal: Positive for nausea and vomiting. Negative for diarrhea and blood in stool.  Genitourinary: Negative for dysuria, urgency and hematuria.  Musculoskeletal: Positive for joint pain.  Neurological: Negative for headaches.      Blood pressure 112/63, pulse 113, temperature 97.9 F (36.6 C), temperature source Oral, resp. rate 20, height 5' 5.5" (1.664 m), weight 85.911 kg (189 lb 6.4 oz), last menstrual period 07/22/2011, SpO2 99.00%. Maternal Exam:  Uterine Assessment: Contraction strength is mild.  Contraction frequency is rare.   Abdomen: Patient reports no abdominal tenderness. Surgical scars: low transverse.   Fundal height is 26.5 cm.   Fetal presentation: vertex  Introitus: Normal vulva. Normal vagina.  Vagina is negative for discharge.  Pelvis: adequate for delivery.   Cervix: Cervix evaluated by sterile speculum exam and digital exam.     Fetal Exam Fetal Monitor Review: Mode: ultrasound.  Baseline rate: 150.  Variability: minimal (<5 bpm).   Pattern: accelerations present and no decelerations.    Fetal State Assessment: Category II - tracings are indeterminate.     Physical Exam  Constitutional: She is oriented to person, place, and time. She appears well-developed and well-nourished.  HENT:  Head: Normocephalic.  Cardiovascular: Normal rate, regular rhythm and normal heart sounds.   No murmur heard. Respiratory: Effort normal and breath sounds normal. No respiratory  distress. She has no wheezes. She has no rales.  GI: Soft. She exhibits no distension. There is no guarding.  Genitourinary: There is no rash on the right labia. There is no rash on the left labia. Uterus is enlarged. Uterus is not tender. Cervix exhibits no discharge and no friability. No bleeding around the vagina. No vaginal discharge found.  Neurological: She is alert and oriented to person, place, and time. She has normal reflexes. She displays normal reflexes (patellar DTRs normal).  Skin: Skin is warm and dry.  Psychiatric: She has a normal mood and affect.    Prenatal labs: ABO, Rh:   Antibody:   Rubella:   RPR:    HBsAg:    HIV:    GBS:     Assessment/Plan: -25 week and 6 day SIUP, with no pre-natal care  -Fetus dated 106w6d by ultra sound  -Gonorrhea and chlamydia and wet prep results pending  -LVEIF and incomplete anatomy scan.   -Hypercoagulable state due to positive lupus anticoagulant.   -Nausea   Plan -Patient to be discharged home from [facility]U.  Follow up with [facility] on 01/11/2012.   -Started Lovenox per consult with Dr. Penne Lash and maternal fetal care. First dose given in MAU and prescription given.  Patient unable to afford Lovenox at this time, and has not yet applied for pregnancy Medicaid.  Tresa Endo Rassette attempting to get patient into free Lovenox program.   -Prescriptions for Zofran and Phenergan given to the patient.    [name] 01/07/2012, 3:13 PM

## 2012-01-07 NOTE — Progress Notes (Signed)
Patient states she has not had any prenatal care. Believes her last period was the end of July. Has been feeling movement for about 2 months. Is not having problems but wants to make sure the baby is OK. States she has had 2 previous cesarean sections here at [facility] and that she has Lupus that is not currently being monitored by a physician. States she has been smoking marijuana. The father of the baby was murdered in October and she has been under a lot of stress. No bleeding , leaking or pain. Has a lot of nausea every morning.

## 2012-01-08 ENCOUNTER — Other Ambulatory Visit: Payer: Self-pay | Admitting: Obstetrics & Gynecology

## 2012-01-08 DIAGNOSIS — D6861 Antiphospholipid syndrome: Secondary | ICD-10-CM

## 2012-01-08 DIAGNOSIS — Z86711 Personal history of pulmonary embolism: Secondary | ICD-10-CM

## 2012-01-08 LAB — GC/CHLAMYDIA PROBE AMP, GENITAL
Chlamydia, DNA Probe: NEGATIVE
GC Probe Amp, Genital: NEGATIVE

## 2012-01-08 MED ORDER — ENOXAPARIN SODIUM 100 MG/ML ~~LOC~~ SOLN: 85.0000 mg | Freq: Two times a day (BID) | SUBCUTANEOUS | Status: DC

## 2012-01-08 NOTE — Progress Notes (Signed)
Printed prescription for patient to receive six doses of Lovenox from the pharmacy.  Will check anti-Xa levels on Monday 01/11/12, 4 hours after her am dose. Will follow up on getting more supplies of Lovenox for the patient as she has financial difficulties.

## 2012-01-08 NOTE — Progress Notes (Signed)
   CARE MANAGEMENT NOTE 01/08/2012  Patient:  [name], [name]   Account Number:  [id number]  Date Initiated:  01/08/2012  Documentation initiated by:  Hoy Finlay  Subjective/Objective Assessment:   24 2/[redacted]wks pregnant  hx of PE  new to care     Action/Plan:   needs Lovenox assistance    Comments:  01/08/12 Hoy Finlay, RN, BSN 08:30am- Received message from Mayra Neer from [facility] clinic on land line timed 01/07/12 at 4pm requesting Lovenox assistance for a patient. Patient is a new clinic patient at 20 2/7 weeks and presented to  MAU to "make sure her baby is OK." Patient has a history of a PE in July of 2011. Patient given 1 dose of Lovenox prior to her discharge from MAU and sent home with a prescription for Lovenox. Patient has not yet applied for Medicaid coverage. CM gave [facility] copy of Lovenox application (through [employer]). She will contact patient to see if the patient can back to the clinic today to complete the application including a financial component. The application will then be given to [facility] pharmacy to complete and fax to [employer] and send patient home with enough Lovenox to cover her at least through her next clinic visit. This process will continue until patient's Medicaid application is approved, which can take up to 45 days. Patient has a [facility] clinic appointment and appointment to complete her Medicaid application on 01/11/12. Please contact Case Management if further assistance is needed ([phone]).

## 2012-01-10 NOTE — ED Provider Notes (Signed)
I was present for the exam and agree with above.  Dorathy Kinsman 01/10/2012 4:40 AM

## 2012-01-11 ENCOUNTER — Ambulatory Visit (INDEPENDENT_AMBULATORY_CARE_PROVIDER_SITE_OTHER): Payer: Medicaid Other | Admitting: Advanced Practice Midwife

## 2012-01-11 DIAGNOSIS — F3289 Other specified depressive episodes: Secondary | ICD-10-CM

## 2012-01-11 DIAGNOSIS — I2699 Other pulmonary embolism without acute cor pulmonale: Secondary | ICD-10-CM

## 2012-01-11 DIAGNOSIS — F329 Major depressive disorder, single episode, unspecified: Secondary | ICD-10-CM

## 2012-01-11 DIAGNOSIS — O0990 Supervision of high risk pregnancy, unspecified, unspecified trimester: Secondary | ICD-10-CM

## 2012-01-11 DIAGNOSIS — Z98891 History of uterine scar from previous surgery: Secondary | ICD-10-CM | POA: Insufficient documentation

## 2012-01-11 DIAGNOSIS — O35BXX0 Maternal care for other (suspected) fetal abnormality and damage, fetal cardiac anomalies, not applicable or unspecified: Secondary | ICD-10-CM

## 2012-01-11 DIAGNOSIS — D6861 Antiphospholipid syndrome: Secondary | ICD-10-CM

## 2012-01-11 DIAGNOSIS — O358XX0 Maternal care for other (suspected) fetal abnormality and damage, not applicable or unspecified: Secondary | ICD-10-CM | POA: Insufficient documentation

## 2012-01-11 DIAGNOSIS — J45909 Unspecified asthma, uncomplicated: Secondary | ICD-10-CM

## 2012-01-11 DIAGNOSIS — D6859 Other primary thrombophilia: Secondary | ICD-10-CM

## 2012-01-11 DIAGNOSIS — Z86711 Personal history of pulmonary embolism: Secondary | ICD-10-CM

## 2012-01-11 DIAGNOSIS — Z7901 Long term (current) use of anticoagulants: Secondary | ICD-10-CM

## 2012-01-11 DIAGNOSIS — O09899 Supervision of other high risk pregnancies, unspecified trimester: Secondary | ICD-10-CM

## 2012-01-11 DIAGNOSIS — Z9889 Other specified postprocedural states: Secondary | ICD-10-CM

## 2012-01-11 LAB — HIV ANTIBODY (ROUTINE TESTING W REFLEX): HIV: NONREACTIVE

## 2012-01-11 LAB — POCT URINALYSIS DIP (DEVICE)
Glucose, UA: NEGATIVE mg/dL
Leukocytes, UA: NEGATIVE
Protein, ur: 30 mg/dL — AB

## 2012-01-11 LAB — RPR: RPR: NONREACTIVE

## 2012-01-11 MED ORDER — FLUTICASONE-SALMETEROL 100-50 MCG/DOSE IN AEPB: 1.0000 | INHALATION_SPRAY | Freq: Two times a day (BID) | RESPIRATORY_TRACT | Status: DC

## 2012-01-11 MED ORDER — INFLUENZA VIRUS VACC SPLIT PF IM SUSP
0.5000 mL | INTRAMUSCULAR | Status: AC
  Administered 2012-01-11: 0.5 mL via INTRAMUSCULAR

## 2012-01-11 MED ORDER — ENOXAPARIN SODIUM 100 MG/ML ~~LOC~~ SOLN: 85.0000 mg | Freq: Two times a day (BID) | SUBCUTANEOUS | Status: DC

## 2012-01-11 NOTE — Patient Instructions (Signed)
Pregnancy - Second Trimester The second trimester of pregnancy (3 to 6 months) is a period of rapid growth for you and your baby. At the end of the sixth month, your baby is about 9 inches long and weighs 1 1/2 pounds. You will begin to feel the baby move between 18 and 20 weeks of the pregnancy. This is called quickening. Weight gain is faster. A clear fluid (colostrum) may leak out of your breasts. You may feel small contractions of the womb (uterus). This is known as false labor or Braxton-Hicks contractions. This is like a practice for labor when the baby is ready to be born. Usually, the problems with morning sickness have usually passed by the end of your first trimester. Some women develop small dark blotches (called cholasma, mask of pregnancy) on their face that usually goes away after the baby is born. Exposure to the sun makes the blotches worse. Acne may also develop in some pregnant women and pregnant women who have acne, may find that it goes away. PRENATAL EXAMS  Blood work may continue to be done during prenatal exams. These tests are done to check on your health and the probable health of your baby. Blood work is used to follow your blood levels (hemoglobin). Anemia (low hemoglobin) is common during pregnancy. Iron and vitamins are given to help prevent this. You will also be checked for diabetes between 24 and 28 weeks of the pregnancy. Some of the previous blood tests may be repeated.   The size of the uterus is measured during each visit. This is to make sure that the baby is continuing to grow properly according to the dates of the pregnancy.   Your blood pressure is checked every prenatal visit. This is to make sure you are not getting toxemia.   Your urine is checked to make sure you do not have an infection, diabetes or protein in the urine.   Your weight is checked often to make sure gains are happening at the suggested rate. This is to ensure that both you and your baby are  growing normally.   Sometimes, an ultrasound is performed to confirm the proper growth and development of the baby. This is a test which bounces harmless sound waves off the baby so your caregiver can more accurately determine due dates.  Sometimes, a specialized test is done on the amniotic fluid surrounding the baby. This test is called an amniocentesis. The amniotic fluid is obtained by sticking a needle into the belly (abdomen). This is done to check the chromosomes in instances where there is a concern about possible genetic problems with the baby. It is also sometimes done near the end of pregnancy if an early delivery is required. In this case, it is done to help make sure the baby's lungs are mature enough for the baby to live outside of the womb. CHANGES OCCURING IN THE SECOND TRIMESTER OF PREGNANCY Your body goes through many changes during pregnancy. They vary from person to person. Talk to your caregiver about changes you notice that you are concerned about.  During the second trimester, you will likely have an increase in your appetite. It is normal to have cravings for certain foods. This varies from person to person and pregnancy to pregnancy.   Your lower abdomen will begin to bulge.   You may have to urinate more often because the uterus and baby are pressing on your bladder. It is also common to get more bladder infections during pregnancy (  pain with urination). You can help this by drinking lots of fluids and emptying your bladder before and after intercourse.   You may begin to get stretch marks on your hips, abdomen, and breasts. These are normal changes in the body during pregnancy. There are no exercises or medications to take that prevent this change.   You may begin to develop swollen and bulging veins (varicose veins) in your legs. Wearing support hose, elevating your feet for 15 minutes, 3 to 4 times a day and limiting salt in your diet helps lessen the problem.    Heartburn may develop as the uterus grows and pushes up against the stomach. Antacids recommended by your caregiver helps with this problem. Also, eating smaller meals 4 to 5 times a day helps.   Constipation can be treated with a stool softener or adding bulk to your diet. Drinking lots of fluids, vegetables, fruits, and whole grains are helpful.   Exercising is also helpful. If you have been very active up until your pregnancy, most of these activities can be continued during your pregnancy. If you have been less active, it is helpful to start an exercise program such as walking.   Hemorrhoids (varicose veins in the rectum) may develop at the end of the second trimester. Warm sitz baths and hemorrhoid cream recommended by your caregiver helps hemorrhoid problems.   Backaches may develop during this time of your pregnancy. Avoid heavy lifting, wear low heal shoes and practice good posture to help with backache problems.   Some pregnant women develop tingling and numbness of their hand and fingers because of swelling and tightening of ligaments in the wrist (carpel tunnel syndrome). This goes away after the baby is born.   As your breasts enlarge, you may have to get a bigger bra. Get a comfortable, cotton, support bra. Do not get a nursing bra until the last month of the pregnancy if you will be nursing the baby.   You may get a dark line from your belly button to the pubic area called the linea nigra.   You may develop rosy cheeks because of increase blood flow to the face.   You may develop spider looking lines of the face, neck, arms and chest. These go away after the baby is born.  HOME CARE INSTRUCTIONS   It is extremely important to avoid all smoking, herbs, alcohol, and unprescribed drugs during your pregnancy. These chemicals affect the formation and growth of the baby. Avoid these chemicals throughout the pregnancy to ensure the delivery of a healthy infant.   Most of your home  care instructions are the same as suggested for the first trimester of your pregnancy. Keep your caregiver's appointments. Follow your caregiver's instructions regarding medication use, exercise and diet.   During pregnancy, you are providing food for you and your baby. Continue to eat regular, well-balanced meals. Choose foods such as meat, fish, milk and other low fat dairy products, vegetables, fruits, and whole-grain breads and cereals. Your caregiver will tell you of the ideal weight gain.   A physical sexual relationship may be continued up until near the end of pregnancy if there are no other problems. Problems could include early (premature) leaking of amniotic fluid from the membranes, vaginal bleeding, abdominal pain, or other medical or pregnancy problems.   Exercise regularly if there are no restrictions. Check with your caregiver if you are unsure of the safety of some of your exercises. The greatest weight gain will occur in the   last 2 trimesters of pregnancy. Exercise will help you:   Control your weight.   Get you in shape for labor and delivery.   Lose weight after you have the baby.   Wear a good support or jogging bra for breast tenderness during pregnancy. This may help if worn during sleep. Pads or tissues may be used in the bra if you are leaking colostrum.   Do not use hot tubs, steam rooms or saunas throughout the pregnancy.   Wear your seat belt at all times when driving. This protects you and your baby if you are in an accident.   Avoid raw meat, uncooked cheese, cat litter boxes and soil used by cats. These carry germs that can cause birth defects in the baby.   The second trimester is also a good time to visit your dentist for your dental health if this has not been done yet. Getting your teeth cleaned is OK. Use a soft toothbrush. Brush gently during pregnancy.   It is easier to loose urine during pregnancy. Tightening up and strengthening the pelvic muscles will  help with this problem. Practice stopping your urination while you are going to the bathroom. These are the same muscles you need to strengthen. It is also the muscles you would use as if you were trying to stop from passing gas. You can practice tightening these muscles up 10 times a set and repeating this about 3 times per day. Once you know what muscles to tighten up, do not perform these exercises during urination. It is more likely to contribute to an infection by backing up the urine.   Ask for help if you have financial, counseling or nutritional needs during pregnancy. Your caregiver will be able to offer counseling for these needs as well as refer you for other special needs.   Your skin may become oily. If so, wash your face with mild soap, use non-greasy moisturizer and oil or cream based makeup.  MEDICATIONS AND DRUG USE IN PREGNANCY  Take prenatal vitamins as directed. The vitamin should contain 1 milligram of folic acid. Keep all vitamins out of reach of children. Only a couple vitamins or tablets containing iron may be fatal to a baby or young child when ingested.   Avoid use of all medications, including herbs, over-the-counter medications, not prescribed or suggested by your caregiver. Only take over-the-counter or prescription medicines for pain, discomfort, or fever as directed by your caregiver. Do not use aspirin.   Let your caregiver also know about herbs you may be using.   Alcohol is related to a number of birth defects. This includes fetal alcohol syndrome. All alcohol, in any form, should be avoided completely. Smoking will cause low birth rate and premature babies.   Street or illegal drugs are very harmful to the baby. They are absolutely forbidden. A baby born to an addicted mother will be addicted at birth. The baby will go through the same withdrawal an adult does.  SEEK MEDICAL CARE IF:  You have any concerns or worries during your pregnancy. It is better to call with  your questions if you feel they cannot wait, rather than worry about them. SEEK IMMEDIATE MEDICAL CARE IF:   An unexplained oral temperature above 102 F (38.9 C) develops, or as your caregiver suggests.   You have leaking of fluid from the vagina (birth canal). If leaking membranes are suspected, take your temperature and tell your caregiver of this when you call.   There   is vaginal spotting, bleeding, or passing clots. Tell your caregiver of the amount and how many pads are used. Light spotting in pregnancy is common, especially following intercourse.   You develop a bad smelling vaginal discharge with a change in the color from clear to white.   You continue to feel sick to your stomach (nauseated) and have no relief from remedies suggested. You vomit blood or coffee ground-like materials.   You lose more than 2 pounds of weight or gain more than 2 pounds of weight over 1 week, or as suggested by your caregiver.   You notice swelling of your face, hands, feet, or legs.   You get exposed to German measles and have never had them.   You are exposed to fifth disease or chickenpox.   You develop belly (abdominal) pain. Round ligament discomfort is a common non-cancerous (benign) cause of abdominal pain in pregnancy. Your caregiver still must evaluate you.   You develop a bad headache that does not go away.   You develop fever, diarrhea, pain with urination, or shortness of breath.   You develop visual problems, blurry, or double vision.   You fall or are in a car accident or any kind of trauma.   There is mental or physical violence at home.  Document Released: 12/08/2001 Document Revised: 08/26/2011 Document Reviewed: 06/12/2009 Exit[employer] Patient Information 2012 [employer]. Pregnancy and Smoking Smoking during pregnancy is very unhealthy for the mother and the developing fetus. The addictive drug in cigarettes (nicotine), carbon monoxide, and many other poisons are inhaled  from a cigarette and are carried through your bloodstream to your fetus. Cigarette smoke contains more than 2,500 chemicals. It is not known which of these chemicals are harmful to the developing fetus. However, both nicotine and carbon monoxide play a role in causing health problems in pregnancy. Effects on the fetus of smoking during pregnancy:  Decrease in blood flow and oxygen to the uterus, placenta, and your fetus.   Increased heart rate of the fetus.   Slowing of your fetus's growth in the uterus (intrauterine growth retardation).   Placental problems. Placenta may partially cover or completely cover the opening to the cervix (placenta previa), or the placenta may partially or completely separate from the uterus (placental abruption).   Increase risk of pregnancy outside of the uterus (tubal pregnancy).   Premature rupture membranes, causing the sac that holds the fetus to break too early, resulting in premature birth and increased health risks to the newborn.   Increased risk of birth defects, including heart defects.   Increased risk of miscarriage.  Newborns born to women who smoke during pregnancy:  Are more likely to be born too early (prematurely).   Are more likely to be at a low birth weight.   Are at risk for serious health problems, chronic or lifelong disabilities (cerebral palsy, mental retardation, learning problems), and possibly even death   Are at risk of Sudden Infant Death Syndrome (SIDS).   Have higher rates of miscarriage and stillbirth.   Have more lung and breathing (respiratory) problems.  Long-term effects on a child's behavior: Some of the following trends are seen with children of smoking mothers:  Increased risk for drug abuse, behavior, and conduct disorders.   Increased risk for smoking in adolescent girls.   Increased risk for negative behavior in 2-year-olds.   Increase risk for asthma, colic, and childhood obesity, which can lead to  diabetes.   Increased risk for finger and toe disorders.    Resources to stop smoking during pregnancy:  Counseling.   Psychological treatment.   Acupuncture.   Family intervention.   Hypnosis.   Medicines that are safe to take during pregnancy. Nicotine supplements have not been studied enough. They should only be considered when all other methods fail.   Telephone QUIT lines.  Smoking and Breastfeeding:  Nicotine gets passed to the infant through a mother's breastmilk. This can cause nausea, colic, cramping, and diarrhea in the infant.   Smoking may reduce milk supply and interfere with the let-down response.   Even formula-fed infants of mothers who smoke have nicotine and cotinine (nicotine by-product) in their urine.  Other resources to help stop smoking:  American Cancer Society: www.cancer.org   American Heart Association: www.americanheart.org   National Cancer Institute: www.cancer.gov   Smoke Free Families: www.smokefreefamilies.org   [employer] (QUIT Line): 1-866-66 START  Document Released: 04/27/2005 Document Revised: 08/26/2011 Document Reviewed: 09/25/2009 Exit[employer] Patient Information 2012 [employer]. 

## 2012-01-11 NOTE — Progress Notes (Signed)
Subjective:    [name] is a [id number] [redacted]w[redacted]d being seen today for her first obstetrical visit.  Her obstetrical history is significant for: 1. Pulmonary emboli 2. Pulmonary edema 3. Lung infarct 4. Clotting disorder w/ positive lupus anticoagulant 5. Previous C/S x 2 6. Asthma 7. Depression 8. Late PNC 9. Social issues.   Her clotting disorder was Dx in the postpartum period after her second child in 2011. She was followed by hematology, but was excused from the practice for non-compliance. She was told that she would need life-long anticoagulation due to the severity of her PE, but stopped taking Coumadin in early 2012 due to financial issues.   She reports using inhaler/nebulizer twice a day. She does not have a provider that see for her asthma. She has used Advair in the past w/ good response. Her boyfriend was murdered 10/12. Pt has been seeing a counselor and reports normal grief. No SI/HI. The pt has a Hx of polysubstance abuse and reports stopping everything except for occasional marijuana in 2011. UDS neg 01/07/12.   Patient reports no bleeding, no contractions, no cramping and no leaking.  VSS, AF  HISTORY: OB History    Grav Para Term Preterm Abortions TAB SAB Ect Mult Living   3 2 2  0 0 0 0 0 0 2     # Outc Date GA Lbr Len/2nd Wgt Sex Del Anes PTL Lv   1 TRM 9/09 [redacted]w[redacted]d  7lb6oz(3.345kg) M CS EPI  Yes   2 TRM 5/11 [redacted]w[redacted]d 03:00 6lb5oz(2.863kg) M CS EPI  Yes   3 CUR              Past Medical History  Diagnosis Date  . Pulmonary embolism 06/2010    Multiple bilateral pulmonary emboli diagnosed by CT scan in in 06/2010.  Moderate size pleural effusion and right lower lobe infarction present at that time.  Lupus anticoagulant was present, but all other aspects of her hypercoagulable evaluation were negative including all Cardiolite and antibodies.    . Anticoagulant long-term use   . Asthma     ABG in 11/2010:7.45, 34, 79.  . Lupus anticoagulant disorder     Previously  followed by Dr. Anastasia Fiedler of discharged from his care due to medical noncompliance.  Marland Kitchen HIV positive     Western Blot negative  . Tobacco abuse   . Anemia   . Substance abuse     cocaine, opiates, marijuana; alcohol; most recent positive test for cocaine was in December of 2011; alcohol levels have been as high as 302  . Anxiety and depression     suicide attempt-age 89  . Attention deficit hyperactivity disorder    Past Surgical History  Procedure Date  . Cesarean section 2011   Family History  Problem Relation Age of Onset  . Drug abuse Mother     History   . Drug abuse Father   . Heart attack Father 71    H/o drug abuse     Exam    Uterine Size: 25 cm  Pelvic Exam:    Perineum: No Hemorrhoids, Normal Perineum   Vulva: normal   Vagina:  normal mucosa, normal discharge   pH: NA   Cervix: no bleeding following Pap and no cervical motion tenderness   Adnexa: normal adnexa   Bony Pelvis: average  System: Breast:  normal appearance, no masses or tenderness   Skin: Few 1-3 mm raised red pruritic spots on lower arms.  Neurologic: normal mood, gait normal; reflexes normal and symmetric, grossly non-focal   Extremities: normal strength, tone, and muscle mass   HEENT sclera clear, anicteric and thyroid without masses   Mouth/Teeth mucous membranes moist, pharynx normal without lesions   Neck no masses   Cardiovascular: regular rate and rhythm, no murmurs or gallops   Respiratory:  appears well, vitals normal, no respiratory distress, acyanotic, normal RR, neck free of mass or lymphadenopathy, chest clear, no wheezing, crepitations, rhonchi, normal symmetric air entry   Abdomen: soft, non-tender; bowel sounds normal; no masses,  no organomegaly and Gradvid, S=D   Urinary: urethral meatus normal      Assessment:    Pregnancy: [id number] Patient Active Problem List  Diagnoses  . ANXIETY  . TOBACCO ABUSE  . DEPRESSION  . PULMONARY EMBOLISM  . ASTHMA  .  Anticoagulant long-term use  . History of drug abuse  . Antiphospholipid antibody syndrome  . History of cesarean section, low transverse        Plan:     Initial labs drawn. Prenatal vitamins. Problem list reviewed and updated. Genetic Screening discussed: too late. Ultrasound discussed; fetal survey: done 01/07/12. F/U for LVEIF, ?hyperechoic kidneys and spine not visualialized. Follow up in 3 weeks. 1 hour GTT 50% of 30 min visit spent on counseling and coordination of care.  Rx Lovenox 85 mg [place] BID. 1 week supply dispensed by [facility] pharmacy while pt awaits pregnancy Medicaid. Will Rx weekly supplies until then through indigent program.   Bleeding precautions Discussed Hx of C/S x 2. Pt interested in VBAC. Per consult w/ Dr. Jolayne Panther recommend RLTCS. Plans BTL. 30-day papers signed 01/07/12. Rx Advair Has counselor. F/U PRN for depression. Flu vaccine given.  Dorathy Kinsman 01/11/2012 4:53 PM

## 2012-01-12 LAB — OBSTETRIC PANEL
Antibody Screen: NEGATIVE
Basophils Absolute: 0 10*3/uL (ref 0.0–0.1)
HCT: 32.3 % — ABNORMAL LOW (ref 36.0–46.0)
Lymphocytes Relative: 17 % (ref 12–46)
Lymphs Abs: 2.6 10*3/uL (ref 0.7–4.0)
Monocytes Absolute: 1.3 10*3/uL — ABNORMAL HIGH (ref 0.1–1.0)
Neutro Abs: 11 10*3/uL — ABNORMAL HIGH (ref 1.7–7.7)
RBC: 3.53 MIL/uL — ABNORMAL LOW (ref 3.87–5.11)
RDW: 14.6 % (ref 11.5–15.5)
Rubella: 47.8 IU/mL — ABNORMAL HIGH
WBC: 15.1 10*3/uL — ABNORMAL HIGH (ref 4.0–10.5)

## 2012-01-13 ENCOUNTER — Encounter: Payer: Self-pay | Admitting: Obstetrics & Gynecology

## 2012-01-21 ENCOUNTER — Telehealth: Payer: Self-pay | Admitting: *Deleted

## 2012-01-21 NOTE — Telephone Encounter (Signed)
Called Medicaid Authorization. Authorization was approved for 2 years. Left message for patient letting her know that her medicine was approved and she can call her pharmacy.

## 2012-01-21 NOTE — Telephone Encounter (Signed)
Patient called and left a message about a prescription Dorathy Kinsman, CNM gave her- states it needs authorization for advair

## 2012-02-01 ENCOUNTER — Ambulatory Visit (INDEPENDENT_AMBULATORY_CARE_PROVIDER_SITE_OTHER): Payer: Medicaid Other | Admitting: Obstetrics & Gynecology

## 2012-02-01 DIAGNOSIS — Z9889 Other specified postprocedural states: Secondary | ICD-10-CM

## 2012-02-01 DIAGNOSIS — M999 Biomechanical lesion, unspecified: Secondary | ICD-10-CM

## 2012-02-01 DIAGNOSIS — M9909 Segmental and somatic dysfunction of abdomen and other regions: Secondary | ICD-10-CM

## 2012-02-01 DIAGNOSIS — Z7901 Long term (current) use of anticoagulants: Secondary | ICD-10-CM

## 2012-02-01 DIAGNOSIS — M549 Dorsalgia, unspecified: Secondary | ICD-10-CM

## 2012-02-01 DIAGNOSIS — Z8719 Personal history of other diseases of the digestive system: Secondary | ICD-10-CM

## 2012-02-01 DIAGNOSIS — D6861 Antiphospholipid syndrome: Secondary | ICD-10-CM

## 2012-02-01 DIAGNOSIS — D6859 Other primary thrombophilia: Secondary | ICD-10-CM

## 2012-02-01 LAB — POCT URINALYSIS DIP (DEVICE)
Ketones, ur: NEGATIVE mg/dL
Protein, ur: NEGATIVE mg/dL
Specific Gravity, Urine: 1.015 (ref 1.005–1.030)
pH: 7 (ref 5.0–8.0)

## 2012-02-01 MED ORDER — ESOMEPRAZOLE MAGNESIUM 40 MG PO CPDR: 40.0000 mg | DELAYED_RELEASE_CAPSULE | Freq: Every day | ORAL | Status: DC

## 2012-02-01 NOTE — Patient Instructions (Signed)
Breastfeeding BENEFITS OF BREASTFEEDING For the baby  The first milk (colostrum) helps the baby's digestive system function better.   There are antibodies from the mother in the milk that help the baby fight off infections.   The baby has a lower incidence of asthma, allergies, and SIDS (sudden infant death syndrome).   The nutrients in breast milk are better than formulas for the baby and helps the baby's brain grow better.   Babies who breastfeed have less gas, colic, and constipation.  For the mother  Breastfeeding helps develop a very special bond between mother and baby.   It is more convenient, always available at the correct temperature and cheaper than formula feeding.   It burns calories in the mother and helps with losing weight that was gained during pregnancy.   It makes the uterus contract back down to normal size faster and slows bleeding following delivery.   Breastfeeding mothers have a lower risk of developing breast cancer.  NURSE FREQUENTLY  A healthy, full-term baby may breastfeed as often as every hour or space his or her feedings to every 3 hours.   How often to nurse will vary from baby to baby. Watch your baby for signs of hunger, not the clock.   Nurse as often as the baby requests, or when you feel the need to reduce the fullness of your breasts.   Awaken the baby if it has been 3 to 4 hours since the last feeding.   Frequent feeding will help the mother make more milk and will prevent problems like sore nipples and engorgement of the breasts.  BABY'S POSITION AT THE BREAST  Whether lying down or sitting, be sure that the baby's tummy is facing your tummy.   Support the breast with 4 fingers underneath the breast and the thumb above. Make sure your fingers are well away from the nipple and baby's mouth.   Stroke the baby's lips and cheek closest to the breast gently with your finger or nipple.   When the baby's mouth is open wide enough, place  all of your nipple and as much of the dark area around the nipple as possible into your baby's mouth.   Pull the baby in close so the tip of the nose and the baby's cheeks touch the breast during the feeding.  FEEDINGS  The length of each feeding varies from baby to baby and from feeding to feeding.   The baby must suck about 2 to 3 minutes for your milk to get to him or her. This is called a "let down." For this reason, allow the baby to feed on each breast as long as he or she wants. Your baby will end the feeding when he or she has received the right balance of nutrients.   To break the suction, put your finger into the corner of the baby's mouth and slide it between his or her gums before removing your breast from his or her mouth. This will help prevent sore nipples.  REDUCING BREAST ENGORGEMENT  In the first week after your baby is born, you may experience signs of breast engorgement. When breasts are engorged, they feel heavy, warm, full, and may be tender to the touch. You can reduce engorgement if you:   Nurse frequently, every 2 to 3 hours. Mothers who breastfeed early and often have fewer problems with engorgement.   Place light ice packs on your breasts between feedings. This reduces swelling. Wrap the ice packs in a   lightweight towel to protect your skin.   Apply moist hot packs to your breast for 5 to 10 minutes before each feeding. This increases circulation and helps the milk flow.   Gently massage your breast before and during the feeding.   Make sure that the baby empties at least one breast at every feeding before switching sides.   Use a breast pump to empty the breasts if your baby is sleepy or not nursing well. You may also want to pump if you are returning to work or or you feel you are getting engorged.   Avoid bottle feeds, pacifiers or supplemental feedings of water or juice in place of breastfeeding.   Be sure the baby is latched on and positioned properly while  breastfeeding.   Prevent fatigue, stress, and anemia.   Wear a supportive bra, avoiding underwire styles.   Eat a balanced diet with enough fluids.  If you follow these suggestions, your engorgement should improve in 24 to 48 hours. If you are still experiencing difficulty, call your lactation consultant or caregiver. IS MY BABY GETTING ENOUGH MILK? Sometimes, mothers worry about whether their babies are getting enough milk. You can be assured that your baby is getting enough milk if:  The baby is actively sucking and you hear swallowing.   The baby nurses at least 8 to 12 times in a 24 hour time period. Nurse your baby until he or she unlatches or falls asleep at the first breast (at least 10 to 20 minutes), then offer the second side.   The baby is wetting 5 to 6 disposable diapers (6 to 8 cloth diapers) in a 24 hour period by 5 to 6 days of age.   The baby is having at least 2 to 3 stools every 24 hours for the first few months. Breast milk is all the food your baby needs. It is not necessary for your baby to have water or formula. In fact, to help your breasts make more milk, it is best not to give your baby supplemental feedings during the early weeks.   The stool should be soft and yellow.   The baby should gain 4 to 7 ounces per week after he is 4 days old.  TAKE CARE OF YOURSELF Take care of your breasts by:  Bathing or showering daily.   Avoiding the use of soaps on your nipples.   Start feedings on your left breast at one feeding and on your right breast at the next feeding.   You will notice an increase in your milk supply 2 to 5 days after delivery. You may feel some discomfort from engorgement, which makes your breasts very firm and often tender. Engorgement "peaks" out within 24 to 48 hours. In the meantime, apply warm moist towels to your breasts for 5 to 10 minutes before feeding. Gentle massage and expression of some milk before feeding will soften your breasts, making  it easier for your baby to latch on. Wear a well fitting nursing bra and air dry your nipples for 10 to 15 minutes after each feeding.   Only use cotton bra pads.   Only use pure lanolin on your nipples after nursing. You do not need to wash it off before nursing.  Take care of yourself by:   Eating well-balanced meals and nutritious snacks.   Drinking milk, fruit juice, and water to satisfy your thirst (about 8 glasses a day).   Getting plenty of rest.   Increasing calcium in   your diet (1200 mg a day).   Avoiding foods that you notice affect the baby in a bad way.  SEEK MEDICAL CARE IF:   You have any questions or difficulty with breastfeeding.   You need help.   You have a hard, red, sore area on your breast, accompanied by a fever of 100.5 F (38.1 C) or more.   Your baby is too sleepy to eat well or is having trouble sleeping.   Your baby is wetting less than 6 diapers per day, by 5 days of age.   Your baby's skin or white part of his or her eyes is more yellow than it was in the hospital.   You feel depressed.  Document Released: 12/14/2005 Document Revised: 08/26/2011 Document Reviewed: 07/29/2009 Exit[employer] Patient Information 2012 [employer]. 

## 2012-02-01 NOTE — Progress Notes (Signed)
Appointment scheduled with [facility] today 02/01/12 at 3:30 pm.  They do accept medicaid.
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
## 2012-02-01 NOTE — Progress Notes (Signed)
Addended by: Darrel Hoover on: 02/01/2012 11:18 AM   Modules accepted: Orders

## 2012-02-01 NOTE — Progress Notes (Signed)
BS testing today. On Lovenox for lupus anticoagulant and history of PE 2011. Has quit smoking. Wants to breastfeed.  Back problems- sent for chiropractic. Nexium for GERD.

## 2012-02-04 ENCOUNTER — Telehealth: Payer: Self-pay | Admitting: *Deleted

## 2012-02-04 NOTE — Telephone Encounter (Signed)
Called patient as notified per Dr. Debroah Loop- informed patient she had an abnormal 1 hr GTT- needs 3hrGTT, patient next appt not until 02/15/12- informed her would prefer to test before then- patient states she is able to complete test 02/10/12- instructions given. - will sent to front desk to make appointment 02/10/12 8am for 3hr GTT

## 2012-02-04 NOTE — Telephone Encounter (Signed)
Message copied by Gerome Apley on Thu Feb 04, 2012  8:32 AM ------      Message from: Adam Phenix      Created: Wed Feb 03, 2012  1:59 PM       Abnormal GTT- needs 3hr

## 2012-02-10 ENCOUNTER — Other Ambulatory Visit: Payer: Medicaid Other

## 2012-02-13 ENCOUNTER — Inpatient Hospital Stay (HOSPITAL_COMMUNITY)
Admission: AD | Admit: 2012-02-13 | Discharge: 2012-02-13 | Disposition: A | Discharge status: Home / Self Care | Payer: Medicaid Other | Source: Ambulatory Visit | Attending: Obstetrics & Gynecology | Admitting: Obstetrics & Gynecology

## 2012-02-13 ENCOUNTER — Encounter (HOSPITAL_COMMUNITY): Payer: Self-pay | Admitting: *Deleted

## 2012-02-13 DIAGNOSIS — K5900 Constipation, unspecified: Secondary | ICD-10-CM

## 2012-02-13 DIAGNOSIS — O4700 False labor before 37 completed weeks of gestation, unspecified trimester: Secondary | ICD-10-CM | POA: Insufficient documentation

## 2012-02-13 LAB — URINALYSIS, ROUTINE W REFLEX MICROSCOPIC
Bilirubin Urine: NEGATIVE
Glucose, UA: NEGATIVE mg/dL
Hgb urine dipstick: NEGATIVE
Specific Gravity, Urine: 1.01 (ref 1.005–1.030)
pH: 5.5 (ref 5.0–8.0)

## 2012-02-13 NOTE — Discharge Instructions (Signed)

## 2012-02-13 NOTE — ED Notes (Signed)
Pt states had not had BM in several days and had large BM on admission. Thinks that may have been causing pain.

## 2012-02-13 NOTE — ED Notes (Signed)
Pt admitted via [facility] to room # 7. G3P2 at 31wks. Ctx like pain for about 1.5hrs. On admission, pt requested to go to [facility] to have BM. Asked her to wait for provider to ck. States feels will have diarrhea and knows it is not baby going to deliver. To [facility] and will collect u/a. Dr Aviva Signs notified of pt's admission and status.

## 2012-02-13 NOTE — ED Provider Notes (Signed)
History      HPI 20 y/o F G3P2002 with 31.1 weeks that presents to MAU with abdominal pain "like contractions" ,she has Hx of prior 2 C sections an d F/U at [facility] for Lupus anticoagulant disorder. No vaginal bleeding or leakage of fluid or discharge. Mild headache yesterday but asymptomatic now. Pt while in MAU had a bowel movement (normal consistency) and the pain improved almost completely. No changes in vision, epigastric pain or edema. OB History    Grav Para Term Preterm Abortions TAB SAB Ect Mult Living   3 2 2  0 0 0 0 0 0 2      Past Medical History  Diagnosis Date  . Pulmonary embolism 06/2010    Multiple bilateral pulmonary emboli diagnosed by CT scan in in 06/2010.  Moderate size pleural effusion and right lower lobe infarction present at that time.  Lupus anticoagulant was present, but all other aspects of her hypercoagulable evaluation were negative including all Cardiolite and antibodies.    . Anticoagulant long-term use   . Asthma     ABG in 11/2010:7.45, 34, 79.  . Lupus anticoagulant disorder     Previously followed by Dr. Anastasia Fiedler of discharged from his care due to medical noncompliance.  Marland Kitchen HIV positive     Western Blot negative  . Tobacco abuse   . Anemia   . Substance abuse     cocaine, opiates, marijuana; alcohol; most recent positive test for cocaine was in December of 2011; alcohol levels have been as high as 302  . Anxiety and depression     suicide attempt-age 69  . Attention deficit hyperactivity disorder     Past Surgical History  Procedure Date  . Cesarean section 2011    Family History  Problem Relation Age of Onset  . Drug abuse Mother     History   . Drug abuse Father   . Heart attack Father 20    H/o drug abuse  . Anesthesia problems Neg Hx   . Hypotension Neg Hx   . Malignant hyperthermia Neg Hx   . Pseudochol deficiency Neg Hx     History  Substance Use Topics  . Smoking status: Current Some Day Smoker -- 0.5  packs/day    Types: Cigarettes    Last Attempt to Quit: 06/27/2011  . Smokeless tobacco: Never Used  . Alcohol Use: No     former alcohol abuse    Allergies: No Known Allergies  Prescriptions prior to admission  Medication Sig Dispense Refill  . albuterol (PROVENTIL) (5 MG/ML) 0.5% nebulizer solution Take 5 mg by nebulization every 6 (six) hours as needed for wheezing.  20 mL  12  . calcium carbonate (TUMS - DOSED IN MG ELEMENTAL CALCIUM) 500 MG chewable tablet Chew 1 tablet by mouth every 4 (four) hours as needed. For heartburn      . dimenhyDRINATE (DRAMAMINE) 50 MG tablet Take 25-50 mg by mouth daily as needed. For nausea      . enoxaparin (LOVENOX) 100 MG/ML SOLN Inject 0.85 mLs (85 mg total) into the skin every 12 (twelve) hours.  14 Syringe  0  . enoxaparin (LOVENOX) KIT 1 kit by Does not apply route once.  1 kit  0  . esomeprazole (NEXIUM) 40 MG capsule Take 1 capsule (40 mg total) by mouth daily before breakfast.  30 capsule  3  . Fluticasone-Salmeterol (ADVAIR DISKUS) 100-50 MCG/DOSE AEPB Inhale 1 puff into the lungs 2 (two) times daily.  60  each  12  . levalbuterol (XOPENEX) 1.25 MG/0.5ML nebulizer solution Take 1 ampule by nebulization every 4 (four) hours as needed. Wheezing and shortness of breath      . Prenatal Vit-Fe Fumarate-FA (PRENATAL MULTIVITAMIN) TABS Take 1 tablet by mouth daily.        Review of Systems  Constitutional: Negative for fever.  Eyes: Negative for blurred vision.  Respiratory: Negative for cough.   Cardiovascular: Negative for chest pain.  Gastrointestinal: Negative for nausea and vomiting.  Genitourinary: Negative for dysuria and urgency.  Musculoskeletal: Negative.   Neurological: Positive for headaches.   Physical Exam   Blood pressure 108/57, pulse 82, temperature 97.8 F (36.6 C), temperature source Oral, resp. rate 20, height 5\' 7"  (1.702 m), weight 86.183 kg (190 lb), unknown if currently breastfeeding.  Physical Exam  Constitutional:  She is oriented to person, place, and time. No distress.  HENT:  Mouth/Throat: Oropharynx is clear and moist.  Eyes: Conjunctivae and EOM are normal. Pupils are equal, round, and reactive to light.  Cardiovascular: Normal rate, regular rhythm, normal heart sounds and intact distal pulses.   No murmur heard. Respiratory: Effort normal and breath sounds normal. No respiratory distress.  GI: Soft. Bowel sounds are normal.       Normal abdominal physical exam of a 31w gravid pt.   Genitourinary:       Cervix closed internal OS, Thick and station ballotable.   Musculoskeletal: She exhibits no edema.  Neurological: She is alert and oriented to person, place, and time. She has normal reflexes.    MAU Course  Procedures  MDM FHT: category 1. Will continue monitoring for 20 min. No contraction pattern.  Assessment and Plan  Pt is examined and cervix is closed, thick and station ballotable. Will transfer her care to the incoming provider.  I assumed care of this pt.  No contractions.  FHR reactive without decels.  PT's pain resolved after BM.  Recheck of cx is LTC, pp ballottable.  Will D/C home    D. Piloto [employer]. MD PGY-1 02/13/2012, 7:17 AM

## 2012-02-14 NOTE — ED Provider Notes (Signed)
Medical Screening exam and patient care preformed by advanced practice provider.  Agree with the above management.  

## 2012-02-15 ENCOUNTER — Ambulatory Visit (INDEPENDENT_AMBULATORY_CARE_PROVIDER_SITE_OTHER): Payer: Medicaid Other | Admitting: Family Medicine

## 2012-02-15 ENCOUNTER — Encounter: Payer: Self-pay | Admitting: Family Medicine

## 2012-02-15 VITALS — BP 112/72 | Temp 97.2°F | Wt 198.0 lb

## 2012-02-15 DIAGNOSIS — M549 Dorsalgia, unspecified: Secondary | ICD-10-CM

## 2012-02-15 DIAGNOSIS — O358XX0 Maternal care for other (suspected) fetal abnormality and damage, not applicable or unspecified: Secondary | ICD-10-CM

## 2012-02-15 DIAGNOSIS — Z7901 Long term (current) use of anticoagulants: Secondary | ICD-10-CM

## 2012-02-15 DIAGNOSIS — O34219 Maternal care for unspecified type scar from previous cesarean delivery: Secondary | ICD-10-CM

## 2012-02-15 DIAGNOSIS — F1920 Other psychoactive substance dependence, uncomplicated: Secondary | ICD-10-CM

## 2012-02-15 DIAGNOSIS — O09899 Supervision of other high risk pregnancies, unspecified trimester: Secondary | ICD-10-CM

## 2012-02-15 LAB — POCT URINALYSIS DIP (DEVICE)
Bilirubin Urine: NEGATIVE
Glucose, UA: NEGATIVE mg/dL
Ketones, ur: NEGATIVE mg/dL
Leukocytes, UA: NEGATIVE
Specific Gravity, Urine: 1.02 (ref 1.005–1.030)

## 2012-02-15 MED ORDER — DOCUSATE SODIUM 100 MG PO CAPS: 100.0000 mg | ORAL_CAPSULE | Freq: Two times a day (BID) | ORAL | Status: AC

## 2012-02-15 MED ORDER — ONDANSETRON HCL 4 MG PO TABS: 4.0000 mg | ORAL_TABLET | Freq: Three times a day (TID) | ORAL | Status: DC | PRN

## 2012-02-15 MED ORDER — DIMENHYDRINATE 50 MG PO TABS: 25.0000 mg | ORAL_TABLET | Freq: Every day | ORAL | Status: DC | PRN

## 2012-02-15 MED ORDER — CYCLOBENZAPRINE HCL 10 MG PO TABS: 10.0000 mg | ORAL_TABLET | Freq: Three times a day (TID) | ORAL | Status: DC | PRN

## 2012-02-15 MED ORDER — PROMETHAZINE HCL 25 MG PO TABS: 25.0000 mg | ORAL_TABLET | Freq: Four times a day (QID) | ORAL | Status: DC | PRN

## 2012-02-15 NOTE — Patient Instructions (Signed)
Pregnancy - Third Trimester The third trimester of pregnancy (the last 3 months) is a period of the most rapid growth for you and your baby. The baby approaches a length of 20 inches and a weight of 6 to 10 pounds. The baby is adding on fat and getting ready for life outside your body. While inside, babies have periods of sleeping and waking, suck their thumbs, and hiccups. You can often feel small contractions of the uterus. This is false labor. It is also called Braxton-Hicks contractions. This is like a practice for labor. The usual problems in this stage of pregnancy include more difficulty breathing, swelling of the hands and feet from water retention, and having to urinate more often because of the uterus and baby pressing on your bladder.  PRENATAL EXAMS  Blood work may continue to be done during prenatal exams. These tests are done to check on your health and the probable health of your baby. Blood work is used to follow your blood levels (hemoglobin). Anemia (low hemoglobin) is common during pregnancy. Iron and vitamins are given to help prevent this. You may also continue to be checked for diabetes. Some of the past blood tests may be done again.   The size of the uterus is measured during each visit. This makes sure your baby is growing properly according to your pregnancy dates.   Your blood pressure is checked every prenatal visit. This is to make sure you are not getting toxemia.   Your urine is checked every prenatal visit for infection, diabetes and protein.   Your weight is checked at each visit. This is done to make sure gains are happening at the suggested rate and that you and your baby are growing normally.   Sometimes, an ultrasound is performed to confirm the position and the proper growth and development of the baby. This is a test done that bounces harmless sound waves off the baby so your caregiver can more accurately determine due dates.   Discuss the type of pain  medication and anesthesia you will have during your labor and delivery.   Discuss the possibility and anesthesia if a Cesarean Section might be necessary.   Inform your caregiver if there is any mental or physical violence at home.  Sometimes, a specialized non-stress test, contraction stress test and biophysical profile are done to make sure the baby is not having a problem. Checking the amniotic fluid surrounding the baby is called an amniocentesis. The amniotic fluid is removed by sticking a needle into the belly (abdomen). This is sometimes done near the end of pregnancy if an early delivery is required. In this case, it is done to help make sure the baby's lungs are mature enough for the baby to live outside of the womb. If the lungs are not mature and it is unsafe to deliver the baby, an injection of cortisone medication is given to the mother 1 to 2 days before the delivery. This helps the baby's lungs mature and makes it safer to deliver the baby. CHANGES OCCURING IN THE THIRD TRIMESTER OF PREGNANCY Your body goes through many changes during pregnancy. They vary from person to person. Talk to your caregiver about changes you notice and are concerned about.  During the last trimester, you have probably had an increase in your appetite. It is normal to have cravings for certain foods. This varies from person to person and pregnancy to pregnancy.   You may begin to get stretch marks on your hips,   abdomen, and breasts. These are normal changes in the body during pregnancy. There are no exercises or medications to take which prevent this change.   Constipation may be treated with a stool softener or adding bulk to your diet. Drinking lots of fluids, fiber in vegetables, fruits, and whole grains are helpful.   Exercising is also helpful. If you have been very active up until your pregnancy, most of these activities can be continued during your pregnancy. If you have been less active, it is helpful  to start an exercise program such as walking. Consult your caregiver before starting exercise programs.   Avoid all smoking, alcohol, un-prescribed drugs, herbs and "street drugs" during your pregnancy. These chemicals affect the formation and growth of the baby. Avoid chemicals throughout the pregnancy to ensure the delivery of a healthy infant.   Backache, varicose veins and hemorrhoids may develop or get worse.   You will tire more easily in the third trimester, which is normal.   The baby's movements may be stronger and more often.   You may become short of breath easily.   Your belly button may stick out.   A yellow discharge may leak from your breasts called colostrum.   You may have a bloody mucus discharge. This usually occurs a few days to a week before labor begins.  HOME CARE INSTRUCTIONS   Keep your caregiver's appointments. Follow your caregiver's instructions regarding medication use, exercise, and diet.   During pregnancy, you are providing food for you and your baby. Continue to eat regular, well-balanced meals. Choose foods such as meat, fish, milk and other low fat dairy products, vegetables, fruits, and whole-grain breads and cereals. Your caregiver will tell you of the ideal weight gain.   A physical sexual relationship may be continued throughout pregnancy if there are no other problems such as early (premature) leaking of amniotic fluid from the membranes, vaginal bleeding, or belly (abdominal) pain.   Exercise regularly if there are no restrictions. Check with your caregiver if you are unsure of the safety of your exercises. Greater weight gain will occur in the last 2 trimesters of pregnancy. Exercising helps:   Control your weight.   Get you in shape for labor and delivery.   You lose weight after you deliver.   Rest a lot with legs elevated, or as needed for leg cramps or low back pain.   Wear a good support or jogging bra for breast tenderness during  pregnancy. This may help if worn during sleep. Pads or tissues may be used in the bra if you are leaking colostrum.   Do not use hot tubs, steam rooms, or saunas.   Wear your seat belt when driving. This protects you and your baby if you are in an accident.   Avoid raw meat, cat litter boxes and soil used by cats. These carry germs that can cause birth defects in the baby.   It is easier to loose urine during pregnancy. Tightening up and strengthening the pelvic muscles will help with this problem. You can practice stopping your urination while you are going to the bathroom. These are the same muscles you need to strengthen. It is also the muscles you would use if you were trying to stop from passing gas. You can practice tightening these muscles up 10 times a set and repeating this about 3 times per day. Once you know what muscles to tighten up, do not perform these exercises during urination. It is more likely   to cause an infection by backing up the urine.   Ask for help if you have financial, counseling or nutritional needs during pregnancy. Your caregiver will be able to offer counseling for these needs as well as refer you for other special needs.   Make a list of emergency phone numbers and have them available.   Plan on getting help from family or friends when you go home from the hospital.   Make a trial run to the hospital.   Take prenatal classes with the father to understand, practice and ask questions about the labor and delivery.   Prepare the baby's room/nursery.   Do not travel out of the city unless it is absolutely necessary and with the advice of your caregiver.   Wear only low or no heal shoes to have better balance and prevent falling.  MEDICATIONS AND DRUG USE IN PREGNANCY  Take prenatal vitamins as directed. The vitamin should contain 1 milligram of folic acid. Keep all vitamins out of reach of children. Only a couple vitamins or tablets containing iron may be fatal  to a baby or young child when ingested.   Avoid use of all medications, including herbs, over-the-counter medications, not prescribed or suggested by your caregiver. Only take over-the-counter or prescription medicines for pain, discomfort, or fever as directed by your caregiver. Do not use aspirin, ibuprofen (Motrin, Advil, Nuprin) or naproxen (Aleve) unless OK'd by your caregiver.   Let your caregiver also know about herbs you may be using.   Alcohol is related to a number of birth defects. This includes fetal alcohol syndrome. All alcohol, in any form, should be avoided completely. Smoking will cause low birth rate and premature babies.   Street/illegal drugs are very harmful to the baby. They are absolutely forbidden. A baby born to an addicted mother will be addicted at birth. The baby will go through the same withdrawal an adult does.  SEEK MEDICAL CARE IF: You have any concerns or worries during your pregnancy. It is better to call with your questions if you feel they cannot wait, rather than worry about them. DECISIONS ABOUT CIRCUMCISION You may or may not know the sex of your baby. If you know your baby is a boy, it may be time to think about circumcision. Circumcision is the removal of the foreskin of the penis. This is the skin that covers the sensitive end of the penis. There is no proven medical need for this. Often this decision is made on what is popular at the time or based upon religious beliefs and social issues. You can discuss these issues with your caregiver or pediatrician. SEEK IMMEDIATE MEDICAL CARE IF:   An unexplained oral temperature above 102 F (38.9 C) develops, or as your caregiver suggests.   You have leaking of fluid from the vagina (birth canal). If leaking membranes are suspected, take your temperature and tell your caregiver of this when you call.   There is vaginal spotting, bleeding or passing clots. Tell your caregiver of the amount and how many pads are  used.   You develop a bad smelling vaginal discharge with a change in the color from clear to white.   You develop vomiting that lasts more than 24 hours.   You develop chills or fever.   You develop shortness of breath.   You develop burning on urination.   You loose more than 2 pounds of weight or gain more than 2 pounds of weight or as suggested by your   caregiver.   You notice sudden swelling of your face, hands, and feet or legs.   You develop belly (abdominal) pain. Round ligament discomfort is a common non-cancerous (benign) cause of abdominal pain in pregnancy. Your caregiver still must evaluate you.   You develop a severe headache that does not go away.   You develop visual problems, blurred or double vision.   If you have not felt your baby move for more than 1 hour. If you think the baby is not moving as much as usual, eat something with sugar in it and lie down on your left side for an hour. The baby should move at least 4 to 5 times per hour. Call right away if your baby moves less than that.   You fall, are in a car accident or any kind of trauma.   There is mental or physical violence at home.  Document Released: 12/08/2001 Document Revised: 08/26/2011 Document Reviewed: 06/12/2009 [employer] Patient Information 2012 [employer]. 

## 2012-02-15 NOTE — Progress Notes (Signed)
Needs refill on zofran, phenergen

## 2012-02-15 NOTE — Progress Notes (Signed)
Taking lovenox.  1hr 155.  3hr GTT scheduled. Continues with back pain.  Will prescribe flexeril.  Continuing with chiropractic care. Constipated - colace.  Occasional contractions.  No decreased movement. F/u 2 weeks.

## 2012-02-17 ENCOUNTER — Other Ambulatory Visit: Payer: Medicaid Other

## 2012-02-17 DIAGNOSIS — R7309 Other abnormal glucose: Secondary | ICD-10-CM

## 2012-02-18 LAB — GLUCOSE TOLERANCE, 3 HOURS
Glucose Tolerance, 1 hour: 161 mg/dL (ref 70–189)
Glucose Tolerance, 2 hour: 179 mg/dL — ABNORMAL HIGH (ref 70–164)
Glucose Tolerance, Fasting: 84 mg/dL (ref 70–104)
Glucose, GTT - 3 Hour: 121 mg/dL (ref 70–144)

## 2012-02-22 ENCOUNTER — Encounter: Payer: Self-pay | Admitting: *Deleted

## 2012-02-22 DIAGNOSIS — F1920 Other psychoactive substance dependence, uncomplicated: Secondary | ICD-10-CM | POA: Insufficient documentation

## 2012-02-22 DIAGNOSIS — O99320 Drug use complicating pregnancy, unspecified trimester: Secondary | ICD-10-CM

## 2012-02-29 ENCOUNTER — Encounter: Payer: Medicaid Other | Admitting: Obstetrics & Gynecology

## 2012-03-17 ENCOUNTER — Other Ambulatory Visit: Payer: Self-pay | Admitting: Family Medicine

## 2012-03-21 ENCOUNTER — Emergency Department (HOSPITAL_COMMUNITY)
Admission: EM | Admit: 2012-03-21 | Discharge: 2012-03-21 | Disposition: A | Discharge status: Home / Self Care | Payer: Medicaid Other | Attending: Emergency Medicine | Admitting: Emergency Medicine

## 2012-03-21 ENCOUNTER — Encounter (HOSPITAL_COMMUNITY): Payer: Self-pay | Admitting: Emergency Medicine

## 2012-03-21 DIAGNOSIS — F17200 Nicotine dependence, unspecified, uncomplicated: Secondary | ICD-10-CM | POA: Insufficient documentation

## 2012-03-21 DIAGNOSIS — Z7901 Long term (current) use of anticoagulants: Secondary | ICD-10-CM | POA: Insufficient documentation

## 2012-03-21 DIAGNOSIS — Z86718 Personal history of other venous thrombosis and embolism: Secondary | ICD-10-CM | POA: Insufficient documentation

## 2012-03-21 DIAGNOSIS — O358XX0 Maternal care for other (suspected) fetal abnormality and damage, not applicable or unspecified: Secondary | ICD-10-CM

## 2012-03-21 DIAGNOSIS — R609 Edema, unspecified: Secondary | ICD-10-CM | POA: Insufficient documentation

## 2012-03-21 DIAGNOSIS — Z21 Asymptomatic human immunodeficiency virus [HIV] infection status: Secondary | ICD-10-CM | POA: Insufficient documentation

## 2012-03-21 DIAGNOSIS — M7989 Other specified soft tissue disorders: Secondary | ICD-10-CM

## 2012-03-21 DIAGNOSIS — O212 Late vomiting of pregnancy: Secondary | ICD-10-CM | POA: Insufficient documentation

## 2012-03-21 DIAGNOSIS — F1210 Cannabis abuse, uncomplicated: Secondary | ICD-10-CM | POA: Insufficient documentation

## 2012-03-21 DIAGNOSIS — Z98891 History of uterine scar from previous surgery: Secondary | ICD-10-CM

## 2012-03-21 DIAGNOSIS — J45909 Unspecified asthma, uncomplicated: Secondary | ICD-10-CM | POA: Insufficient documentation

## 2012-03-21 DIAGNOSIS — F1920 Other psychoactive substance dependence, uncomplicated: Secondary | ICD-10-CM

## 2012-03-21 DIAGNOSIS — O98519 Other viral diseases complicating pregnancy, unspecified trimester: Secondary | ICD-10-CM | POA: Insufficient documentation

## 2012-03-21 DIAGNOSIS — F1120 Opioid dependence, uncomplicated: Secondary | ICD-10-CM | POA: Insufficient documentation

## 2012-03-21 DIAGNOSIS — M79609 Pain in unspecified limb: Secondary | ICD-10-CM

## 2012-03-21 DIAGNOSIS — O09899 Supervision of other high risk pregnancies, unspecified trimester: Secondary | ICD-10-CM

## 2012-03-21 HISTORY — DX: Major depressive disorder, single episode, unspecified: F32.9

## 2012-03-21 HISTORY — DX: Bipolar disorder, unspecified: F31.9

## 2012-03-21 HISTORY — DX: Depression, unspecified: F32.A

## 2012-03-21 HISTORY — DX: Gastro-esophageal reflux disease without esophagitis: K21.9

## 2012-03-21 HISTORY — DX: Panic disorder (episodic paroxysmal anxiety): F41.0

## 2012-03-21 HISTORY — DX: Reserved for concepts with insufficient information to code with codable children: IMO0002

## 2012-03-21 HISTORY — DX: Unspecified abnormal cytological findings in specimens from cervix uteri: R87.619

## 2012-03-21 HISTORY — DX: Anxiety disorder, unspecified: F41.9

## 2012-03-21 LAB — COMPREHENSIVE METABOLIC PANEL
AST: 29 U/L (ref 0–37)
Albumin: 2.2 g/dL — ABNORMAL LOW (ref 3.5–5.2)
BUN: 4 mg/dL — ABNORMAL LOW (ref 6–23)
Chloride: 106 mEq/L (ref 96–112)
Creatinine, Ser: 0.64 mg/dL (ref 0.50–1.10)
Potassium: 3.6 mEq/L (ref 3.5–5.1)
Total Protein: 5.8 g/dL — ABNORMAL LOW (ref 6.0–8.3)

## 2012-03-21 LAB — RAPID URINE DRUG SCREEN, HOSP PERFORMED
Benzodiazepines: NOT DETECTED
Cocaine: NOT DETECTED
Opiates: NOT DETECTED

## 2012-03-21 LAB — DIFFERENTIAL
Basophils Absolute: 0 10*3/uL (ref 0.0–0.1)
Basophils Relative: 0 % (ref 0–1)
Eosinophils Absolute: 0.3 10*3/uL (ref 0.0–0.7)
Monocytes Absolute: 0.9 10*3/uL (ref 0.1–1.0)
Monocytes Relative: 10 % (ref 3–12)
Neutro Abs: 4.7 10*3/uL (ref 1.7–7.7)
Neutrophils Relative %: 55 % (ref 43–77)

## 2012-03-21 LAB — CBC
Hemoglobin: 8.9 g/dL — ABNORMAL LOW (ref 12.0–15.0)
MCH: 28.8 pg (ref 26.0–34.0)
MCHC: 32.4 g/dL (ref 30.0–36.0)
RDW: 14.9 % (ref 11.5–15.5)

## 2012-03-21 LAB — URINALYSIS, ROUTINE W REFLEX MICROSCOPIC
Bilirubin Urine: NEGATIVE
Ketones, ur: 15 mg/dL — AB
Leukocytes, UA: NEGATIVE
Nitrite: NEGATIVE
Protein, ur: NEGATIVE mg/dL

## 2012-03-21 LAB — PROTIME-INR
INR: 1.04 (ref 0.00–1.49)
Prothrombin Time: 13.8 seconds (ref 11.6–15.2)

## 2012-03-21 MED ORDER — ONDANSETRON HCL 4 MG/2ML IJ SOLN
4.0000 mg | Freq: Once | INTRAMUSCULAR | Status: AC
  Administered 2012-03-21: 4 mg via INTRAVENOUS
  Filled 2012-03-21: qty 2

## 2012-03-21 MED ORDER — ACETAMINOPHEN 325 MG PO TABS
650.0000 mg | ORAL_TABLET | Freq: Once | ORAL | Status: AC
  Administered 2012-03-21: 650 mg via ORAL
  Filled 2012-03-21: qty 2

## 2012-03-21 MED ORDER — ONDANSETRON 4 MG PO TBDP: 4.0000 mg | ORAL_TABLET | Freq: Three times a day (TID) | ORAL | Status: AC | PRN

## 2012-03-21 MED ORDER — SODIUM CHLORIDE 0.9 % IV BOLUS (SEPSIS)
500.0000 mL | Freq: Once | INTRAVENOUS | Status: AC
  Administered 2012-03-21: 500 mL via INTRAVENOUS

## 2012-03-21 MED ORDER — SODIUM CHLORIDE 0.9 % IV SOLN
Freq: Once | INTRAVENOUS | Status: AC
  Administered 2012-03-21: 12:00:00 via INTRAVENOUS

## 2012-03-21 MED ORDER — ENOXAPARIN SODIUM 100 MG/ML ~~LOC~~ SOLN
85.0000 mg | Freq: Once | SUBCUTANEOUS | Status: AC
  Administered 2012-03-21: 85 mg via SUBCUTANEOUS
  Filled 2012-03-21: qty 1

## 2012-03-21 NOTE — Progress Notes (Signed)
Agar MD requesting to dc pt.  Spoke with Dr Penne Lash re pt's lab results.  FHT reviewed by Dr Penne Lash in its entirety at this time.  Pt ok to be dc'd to home per Dr Penne Lash.

## 2012-03-21 NOTE — Progress Notes (Signed)
Bilateral lower extremity venous duplex completed.  Preliminary report is negative for DVT, SVT, or a Baker's cyst. 

## 2012-03-21 NOTE — Progress Notes (Signed)
Plan of care discussed with pt at this time.  Pt's questions answered in full and to her verbalized understanding.  Pt verbalizes understanding of and agreement with her care plan.  Pt instructed to call the [facility] clinic at [phone] and reschedule the  scheduled OB appointment she missed this month.  Explained to pt the importance of seeing her OB to keep a handle on her Lovenox injections and to adequately follow her pregnancy. PTL precautions reviewed with pt at this time.  Pt encouraged to go to [facility] to be evaluated should she experience ANY s/s of PTL, abdominal cramping, uterine contractions that are regular in nature, vaginal bleeding, any leaking of fluid, or decreased fetal movement.  Pt verbalizes understanding of and agreement with both her dc instructions and her plan of care. Plan of care discussed with pt at this time.  Pt verbalizes understanding of and agreement with all instruction and intent to schedule and keep an OB appointment.

## 2012-03-21 NOTE — Progress Notes (Signed)
Notified of pt's presence while seeing a separate pt at [facility] ED at 1135.  Pt was in [place] upon my notification of her presence.  Pt arrived to ED Room 2 from [place] at 1140 and was placed on EFM/toco at 1142.  Pt in no apparent distress upon arrival to room.  G3P2 EDC 04/15/12 (per [place]) GA 36.3  Pt [facility].  Pt states she was seen there "some time last month" and that she thinks "I missed my appointment this month to tell you the truth".  Allergies:  NKDA, NKFA, no latex allergies  Med hx:       Asthma requiring Proventil rescue inhaler (last dose 03/21/12)               Lupus requiring long term anticoagulant use               H/O multiple PEs in 06/2010               Chronic drug abuse (Pt states she was placed on Methadone two weeks ago and has not used any               prescription drugs since.  Prior to two weeks ago, pt states she was regularly using Percocet, Oxycontin, and "whatever else I could find".)                Current smoker 1/2 PPD               Anxiety (not currently on meds)               Depression (not currently on meds)               Bipolar disorder (not currently on meds)               H/O panic attacks (not currently on meds)  OB GYN hx:                C/S x 2 both at term 2009, 2011               H/o abnormal pap-  Pt states resolved without any need for intervention               Pt states high one hour GTT and three hour was "okay, but they say I'm borderline diabetic"  Meds:               PNV 1 PO daily (last dose last week)              Lovenox 85 mg SQ BID (last dose 0000 on 03/21/12)              Methadone 50 mg PO daily (last dose today, 03/21/12, but states she threw up that dose)              Ventolin 1-2 puffs PRN for asthma (last dose 03/21/12)              Nexium 1 tab PO daily (last dose last week)              Phenergan 25 mg PO PRN (last dose last week)              Zofran one tab PO PRN (last dose last  week)  Pt presented to [facility] ED for complaint of BLE swelling and bilat hand and wrist swelling that is painful in  nature.   Pt states swelling began over the last week and has become "too much to handle today".  Pt states normal fetal movement and denies LOF or vaginal bleeding.  Pt denies any UCs but states it does "sometimes feel like she is balling up".    VSS.  FHT 145 with placement of EFM/toco.  Abdomen soft and nontender upon palpation.  No evidence of vaginal bleeding or LOF.  FHTs 135 with 10x10 accels and occasional variable noted.  UCs irregular with irregular irritability noted.  UCs palpate mild with abdomen soft and nontender bt UCs and adequate resting tone noted.  SVE performed. (FT/thick/ballotable).  3+ pitting edema BLE to knee.  DTRs 2+ bilat.  No clonus.  Dr Penne Lash updated re pt presence, status, presenting complaint, FHT, UCs, SVE.  Orders received.

## 2012-03-21 NOTE — Discharge Instructions (Signed)
Your testing today was normal. There were no signs of a blood clot, or significant abnormalities on your blood work. Please follow up with the clinic at [facility] in 1-2 days for a recheck. You've been given a prescription for Zofran to take for your nausea. Please return to the ED for worsening condition.   Nausea and Vomiting Nausea is a sick feeling that often comes before throwing up (vomiting). Vomiting is a reflex where stomach contents come out of your mouth. Vomiting can cause severe loss of body fluids (dehydration). Children and elderly adults can become dehydrated quickly, especially if they also have diarrhea. Nausea and vomiting are symptoms of a condition or disease. It is important to find the cause of your symptoms. CAUSES   Direct irritation of the stomach lining. This irritation can result from increased acid production (gastroesophageal reflux disease), infection, food poisoning, taking certain medicines (such as nonsteroidal anti-inflammatory drugs), alcohol use, or tobacco use.   Signals from the brain.These signals could be caused by a headache, heat exposure, an inner ear disturbance, increased pressure in the brain from injury, infection, a tumor, or a concussion, pain, emotional stimulus, or metabolic problems.   An obstruction in the gastrointestinal tract (bowel obstruction).   Illnesses such as diabetes, hepatitis, gallbladder problems, appendicitis, kidney problems, cancer, sepsis, atypical symptoms of a heart attack, or eating disorders.   Medical treatments such as chemotherapy and radiation.   Receiving medicine that makes you sleep (general anesthetic) during surgery.  DIAGNOSIS Your caregiver may ask for tests to be done if the problems do not improve after a few days. Tests may also be done if symptoms are severe or if the reason for the nausea and vomiting is not clear. Tests may include:  Urine tests.   Blood tests.   Stool tests.   Cultures (to look for  evidence of infection).   X-rays or other imaging studies.  Test results can help your caregiver make decisions about treatment or the need for additional tests. TREATMENT You need to stay well hydrated. Drink frequently but in small amounts.You may wish to drink water, sports drinks, clear broth, or eat frozen ice pops or gelatin dessert to help stay hydrated.When you eat, eating slowly may help prevent nausea.There are also some antinausea medicines that may help prevent nausea. HOME CARE INSTRUCTIONS   Take all medicine as directed by your caregiver.   If you do not have an appetite, do not force yourself to eat. However, you must continue to drink fluids.   If you have an appetite, eat a normal diet unless your caregiver tells you differently.   Eat a variety of complex carbohydrates (rice, wheat, potatoes, bread), lean meats, yogurt, fruits, and vegetables.   Avoid high-fat foods because they are more difficult to digest.   Drink enough water and fluids to keep your urine clear or pale yellow.   If you are dehydrated, ask your caregiver for specific rehydration instructions. Signs of dehydration may include:   Severe thirst.   Dry lips and mouth.   Dizziness.   Dark urine.   Decreasing urine frequency and amount.   Confusion.   Rapid breathing or pulse.  SEEK IMMEDIATE MEDICAL CARE IF:   You have blood or brown flecks (like coffee grounds) in your vomit.   You have black or bloody stools.   You have a severe headache or stiff neck.   You are confused.   You have severe abdominal pain.   You have chest pain  or trouble breathing.   You do not urinate at least once every 8 hours.   You develop cold or clammy skin.   You continue to vomit for longer than 24 to 48 hours.   You have a fever.  MAKE SURE YOU:   Understand these instructions.   Will watch your condition.   Will get help right away if you are not doing well or get worse.  Document  Released: 12/14/2005 Document Revised: 12/03/2011 Document Reviewed: 05/13/2011 [facility] Patient Information 2012 [place], [place].

## 2012-03-21 NOTE — ED Notes (Signed)
Pt c/o pain in hands that is chronic in nature but more severe over last several days with swelling; pt 8 months pregnant; pt sts baby moving per norm; pt seen at methadone clinic for substance abuse hx and went this am; pt c/o nausea and vomiting; pt with swelling to bilateral lower extremities

## 2012-03-21 NOTE — ED Notes (Signed)
Joy, [facility] RN came at same time as pickup to heart and vascular.  Joy will read fetal heart rate rhythm strip when patient returns.

## 2012-03-21 NOTE — Progress Notes (Addendum)
Spoke with pt and agreed to call clinic for new appointment as "I don't want to" (stated by pt).  Appointment scheduled for Thursday March 28th, 2013 at 1030. Pt states "I promise to go".  Pt given the number [phone] to call, should she not be able to make the appointment.

## 2012-03-21 NOTE — ED Notes (Signed)
Attempted to have patient urinate; patient only able to urinate a minute amount of urine.  Santina Evans PA notified.

## 2012-03-21 NOTE — ED Provider Notes (Signed)
History     CSN: [id number]  Arrival date & time 03/21/12  9562   First MD Initiated Contact with Patient 03/21/12 [phone]      Chief Complaint  Patient presents with  . Hand Pain  . Leg Swelling  . Emesis    (Consider location/radiation/quality/duration/timing/severity/associated sxs/prior treatment) HPI History from patient. 21 year old female who is 8 months pregnant presents with increased swelling to her bilateral legs as well as her hands for the past 3-4 days. This has been notably increased from her normal swelling with pregnancy. She has not noticed any redness to the area or calf tenderness. Notes a sensation of pain and burning to her bilateral hands and feet. No recent hospitalizations, immobilizations. Of note, she does have a history of multiple bilateral pulmonary emboli which were diagnosed in 2011. Hypercoagulability workup was negative except for lupus anticoagulant. She is currently on Lovenox.  Patient states that she is currently being seen at the methadone clinic for her history of substance abuse. States she just started there about a week ago. She went to the clinic this morning for a dose and was told that her labs which were drawn on the previous visit were abnormal, but she is unsure what these labs showed. She has had nausea and vomiting this morning, which has been present throughout most of her pregnancy, but this has worsened this morning. She states that she vomited up her dose of methadone upon arrival to the ED this morning.  She is followed at the high risk clinic at [facility] for her pregnancy. She reports that everything with her pregnancy has been normal thus far. She is G3 P2 and reports that her previous pregnancies were carried to term without complication. Has noticed the baby moving as normal today. No bleeding, LOF.  Past Medical History  Diagnosis Date  . Pulmonary embolism 06/2010    Multiple bilateral pulmonary emboli diagnosed by CT scan  in in 06/2010.  Moderate size pleural effusion and right lower lobe infarction present at that time.  Lupus anticoagulant was present, but all other aspects of her hypercoagulable evaluation were negative including all Cardiolite and antibodies.    . Anticoagulant long-term use   . Asthma     ABG in 11/2010:7.45, 34, 79.  . Lupus anticoagulant disorder     Previously followed by Dr. Anastasia Fiedler of discharged from his care due to medical noncompliance.  Marland Kitchen HIV positive     Western Blot negative  . Tobacco abuse   . Anemia   . Substance abuse     cocaine, opiates, marijuana; alcohol; most recent positive test for cocaine was in December of 2011; alcohol levels have been as high as 302  . Anxiety and depression     suicide attempt-age 89  . Attention deficit hyperactivity disorder     Past Surgical History  Procedure Date  . Cesarean section 2011    Family History  Problem Relation Age of Onset  . Drug abuse Mother     History   . Drug abuse Father   . Heart attack Father 69    H/o drug abuse  . Anesthesia problems Neg Hx   . Hypotension Neg Hx   . Malignant hyperthermia Neg Hx   . Pseudochol deficiency Neg Hx     History  Substance Use Topics  . Smoking status: Current Some Day Smoker -- 0.5 packs/day    Types: Cigarettes    Last Attempt to Quit: 06/27/2011  . Smokeless tobacco: Never  Used  . Alcohol Use: No     former alcohol abuse    OB History    Grav Para Term Preterm Abortions TAB SAB Ect Mult Living   3 2 2  0 0 0 0 0 0 2      Review of Systems  Constitutional: Negative for fever, chills, activity change and appetite change.  HENT: Negative for congestion, sore throat, facial swelling, rhinorrhea and neck pain.   Respiratory: Negative for chest tightness and shortness of breath.   Cardiovascular: Negative for chest pain and palpitations.  Gastrointestinal: Positive for nausea and vomiting. Negative for abdominal pain.  Genitourinary: Negative for vaginal  bleeding and vaginal discharge.  Musculoskeletal: Negative for myalgias.  Skin: Negative for rash.  Neurological: Negative for dizziness and weakness.    Allergies  Review of patient's allergies indicates no known allergies.  Home Medications   Current Outpatient Rx  Name Route Sig Dispense Refill  . ALBUTEROL SULFATE (5 MG/ML) 0.5% IN NEBU Nebulization Take 5 mg by nebulization every 6 (six) hours as needed. As needed for asthma    . CYCLOBENZAPRINE HCL 10 MG PO TABS Oral Take 10 mg by mouth every 8 (eight) hours as needed. For pain    . ENOXAPARIN SODIUM 100 MG/ML Hutchinson SOLN Subcutaneous Inject 85 mg into the skin every 12 (twelve) hours.    Marland Kitchen ESOMEPRAZOLE MAGNESIUM 40 MG PO CPDR Oral Take 40 mg by mouth daily before breakfast.    . FLUTICASONE-SALMETEROL 100-50 MCG/DOSE IN AEPB Inhalation Inhale 1 puff into the lungs 2 (two) times daily.    Marland Kitchen METHADONE HCL 10 MG/ML PO CONC Oral Take 50 mg by mouth daily.    Marland Kitchen ONDANSETRON HCL 4 MG PO TABS Oral Take 4 mg by mouth every 8 (eight) hours as needed. As needed for upset stomach    . PRENATAL MULTIVITAMIN CH Oral Take 1 tablet by mouth daily.    Marland Kitchen PROMETHAZINE HCL 25 MG PO TABS Oral Take 25 mg by mouth every 6 (six) hours as needed. As needed for nausea      BP 114/72  Pulse 77  Temp(Src) 98.5 F (36.9 C) (Oral)  Resp 16  SpO2 100%  Breastfeeding? Unknown  Physical Exam  Nursing note and vitals reviewed. Constitutional: She appears well-developed and well-nourished. No distress.       Uncomfortable appearing, nontoxic  HENT:  Head: Normocephalic and atraumatic.  Eyes: EOM are normal. Pupils are equal, round, and reactive to light.  Neck: Normal range of motion.  Cardiovascular: Normal rate and regular rhythm.   Pulmonary/Chest: Effort normal and breath sounds normal. She exhibits no tenderness.  Abdominal: Soft. Bowel sounds are normal. There is no tenderness. There is no rebound.       Gravid, fundal height appropriate for  reported gestation  Musculoskeletal: Normal range of motion. She exhibits edema.       B/L edema to LEs without pitting, L calf slightly TTP  Neurological: She is alert.  Skin: Skin is warm and dry. No rash noted. She is not diaphoretic.  Psychiatric: She has a normal mood and affect.    ED Course  Procedures (including critical care time)  Labs Reviewed  URINALYSIS, ROUTINE W REFLEX MICROSCOPIC - Abnormal; Notable for the following:    APPearance HAZY (*)    Ketones, ur 15 (*)    All other components within normal limits  CBC - Abnormal; Notable for the following:    RBC 3.09 (*)    Hemoglobin 8.9 (*)  HCT 27.5 (*)    All other components within normal limits  COMPREHENSIVE METABOLIC PANEL - Abnormal; Notable for the following:    BUN 4 (*)    Total Protein 5.8 (*)    Albumin 2.2 (*)    Alkaline Phosphatase 142 (*)    Total Bilirubin 0.2 (*)    All other components within normal limits  URINE RAPID DRUG SCREEN (HOSP PERFORMED) - Abnormal; Notable for the following:    Tetrahydrocannabinol POSITIVE (*)    All other components within normal limits  DIFFERENTIAL  PROTIME-INR  APTT   No results found.   1. Drug dependence, antepartum   2. History of cesarean section, low transverse   3. Supervision of other high-risk pregnancy   4. Echogenic focus of heart, fetal, affecting care of mother, antepartum   5. Nausea and vomiting       MDM  Pt presents with multiple sx. Concern for poss DVT as pt has b/l leg swelling and hx PEs. She is on Lovenox. B/L dopplers negative today (see sonographer note). Lab investigation largely unremarkable - minor drop in H/H which I feel is likely attributable to anemia of pregnancy. Pt monitored throughout stay in dept on fetal monitor with no abnormal findings. OB RN shared findings with Dr. Penne Lash, pt's OB, who felt it was safe to send pt home. She was given dose of Lovenox before d/c. RN made pt an appointment with high risk clinic for  later this week which she promised to keep. Findings and plan d/w pt. Return precautions given.        Grant Fontana, [place] 03/21/12 [phone]

## 2012-03-21 NOTE — ED Notes (Signed)
Patient presents with nausea and vomiting today with edema present to bilateral lower extremities. Patient reporting she vomited up her Methadone this AM and is worried.  Gravida 3, para 2.  All previous births were carried to term. Patient denies abdominal pain, reports baby is moving well, reporting pain and burning sensation to bilateral hands and feet.  Moderate edema present with mild indentation; pedal pulses present; patient ambulatory in department.

## 2012-03-21 NOTE — ED Notes (Signed)
Waiting on Lovenox to arrive from pharmacy, then patient to be discharged home.

## 2012-03-21 NOTE — ED Provider Notes (Signed)
Medical screening examination/treatment/procedure(s) were performed by non-physician practitioner and as supervising physician I was immediately available for consultation/collaboration.   Lyanne Co, MD 03/21/12 [phone]

## 2012-03-24 ENCOUNTER — Ambulatory Visit (INDEPENDENT_AMBULATORY_CARE_PROVIDER_SITE_OTHER): Payer: Medicaid Other | Admitting: Obstetrics & Gynecology

## 2012-03-24 VITALS — BP 103/60 | Temp 97.2°F | Wt 202.0 lb

## 2012-03-24 DIAGNOSIS — O0990 Supervision of high risk pregnancy, unspecified, unspecified trimester: Secondary | ICD-10-CM

## 2012-03-24 DIAGNOSIS — O09899 Supervision of other high risk pregnancies, unspecified trimester: Secondary | ICD-10-CM

## 2012-03-24 DIAGNOSIS — F1920 Other psychoactive substance dependence, uncomplicated: Secondary | ICD-10-CM

## 2012-03-24 DIAGNOSIS — N390 Urinary tract infection, site not specified: Secondary | ICD-10-CM

## 2012-03-24 LAB — POCT URINALYSIS DIP (DEVICE)
Hgb urine dipstick: NEGATIVE
Protein, ur: 30 mg/dL — AB
Specific Gravity, Urine: 1.025 (ref 1.005–1.030)
pH: 6 (ref 5.0–8.0)

## 2012-03-24 MED ORDER — ONDANSETRON HCL 4 MG PO TABS: 4.0000 mg | ORAL_TABLET | Freq: Three times a day (TID) | ORAL | Status: DC | PRN

## 2012-03-24 MED ORDER — CYCLOBENZAPRINE HCL 10 MG PO TABS: 10.0000 mg | ORAL_TABLET | Freq: Three times a day (TID) | ORAL | Status: DC | PRN

## 2012-03-24 MED ORDER — ALBUTEROL SULFATE (5 MG/ML) 0.5% IN NEBU: 5.0000 mg | INHALATION_SOLUTION | Freq: Four times a day (QID) | RESPIRATORY_TRACT | Status: DC | PRN

## 2012-03-24 MED ORDER — PROMETHAZINE HCL 25 MG PO TABS: 25.0000 mg | ORAL_TABLET | Freq: Four times a day (QID) | ORAL | Status: DC | PRN

## 2012-03-24 NOTE — Progress Notes (Signed)
Cultures today.  Pt does not feel like she empties her bladder well.. 1+ protein on urine.  Will send culture.  Pt for rpt c/s + BTL.  Will schedule.  Needs to met with NICU tema about Methadone babies and expectations.  Needs heparin

## 2012-03-24 NOTE — Progress Notes (Signed)
U/S scheduled 03/29/12 at 1030 am. Will forward information to [facility] to set up NICU consult. Patient is agreeable to the consult.

## 2012-03-25 LAB — GC/CHLAMYDIA PROBE AMP, GENITAL: GC Probe Amp, Genital: NEGATIVE

## 2012-03-26 ENCOUNTER — Encounter (HOSPITAL_COMMUNITY): Payer: Self-pay | Admitting: Pharmacist

## 2012-03-28 LAB — CULTURE, OB URINE

## 2012-03-29 ENCOUNTER — Ambulatory Visit (HOSPITAL_COMMUNITY)
Admission: RE | Admit: 2012-03-29 | Discharge: 2012-03-29 | Disposition: A | Discharge status: Home / Self Care | Payer: Medicaid Other | Source: Ambulatory Visit | Attending: Obstetrics & Gynecology | Admitting: Obstetrics & Gynecology

## 2012-03-29 DIAGNOSIS — O0930 Supervision of pregnancy with insufficient antenatal care, unspecified trimester: Secondary | ICD-10-CM | POA: Insufficient documentation

## 2012-03-29 DIAGNOSIS — O34219 Maternal care for unspecified type scar from previous cesarean delivery: Secondary | ICD-10-CM | POA: Insufficient documentation

## 2012-03-29 DIAGNOSIS — O09899 Supervision of other high risk pregnancies, unspecified trimester: Secondary | ICD-10-CM

## 2012-03-29 DIAGNOSIS — O355XX0 Maternal care for (suspected) damage to fetus by drugs, not applicable or unspecified: Secondary | ICD-10-CM | POA: Insufficient documentation

## 2012-03-31 ENCOUNTER — Encounter: Payer: Medicaid Other | Admitting: Obstetrics & Gynecology

## 2012-04-04 ENCOUNTER — Ambulatory Visit (INDEPENDENT_AMBULATORY_CARE_PROVIDER_SITE_OTHER): Payer: Medicaid Other | Admitting: Obstetrics & Gynecology

## 2012-04-04 ENCOUNTER — Inpatient Hospital Stay (HOSPITAL_COMMUNITY): Admission: RE | Admit: 2012-04-04 | Discharge: 2012-04-04 | Payer: Medicaid Other | Source: Ambulatory Visit

## 2012-04-04 ENCOUNTER — Ambulatory Visit (HOSPITAL_COMMUNITY)
Admission: RE | Admit: 2012-04-04 | Discharge: 2012-04-04 | Disposition: A | Discharge status: Home / Self Care | Payer: Medicaid Other | Source: Ambulatory Visit | Attending: Obstetrics & Gynecology | Admitting: Obstetrics & Gynecology

## 2012-04-04 VITALS — BP 127/83 | Temp 97.7°F | Wt 201.1 lb

## 2012-04-04 DIAGNOSIS — O09899 Supervision of other high risk pregnancies, unspecified trimester: Secondary | ICD-10-CM

## 2012-04-04 DIAGNOSIS — F1920 Other psychoactive substance dependence, uncomplicated: Secondary | ICD-10-CM

## 2012-04-04 DIAGNOSIS — O358XX0 Maternal care for other (suspected) fetal abnormality and damage, not applicable or unspecified: Secondary | ICD-10-CM

## 2012-04-04 DIAGNOSIS — O34219 Maternal care for unspecified type scar from previous cesarean delivery: Secondary | ICD-10-CM | POA: Insufficient documentation

## 2012-04-04 DIAGNOSIS — O368390 Maternal care for abnormalities of the fetal heart rate or rhythm, unspecified trimester, not applicable or unspecified: Secondary | ICD-10-CM | POA: Insufficient documentation

## 2012-04-04 DIAGNOSIS — O99320 Drug use complicating pregnancy, unspecified trimester: Secondary | ICD-10-CM

## 2012-04-04 DIAGNOSIS — O355XX0 Maternal care for (suspected) damage to fetus by drugs, not applicable or unspecified: Secondary | ICD-10-CM | POA: Insufficient documentation

## 2012-04-04 DIAGNOSIS — F1910 Other psychoactive substance abuse, uncomplicated: Secondary | ICD-10-CM

## 2012-04-04 DIAGNOSIS — Z7901 Long term (current) use of anticoagulants: Secondary | ICD-10-CM

## 2012-04-04 DIAGNOSIS — Z98891 History of uterine scar from previous surgery: Secondary | ICD-10-CM

## 2012-04-04 DIAGNOSIS — O0930 Supervision of pregnancy with insufficient antenatal care, unspecified trimester: Secondary | ICD-10-CM | POA: Insufficient documentation

## 2012-04-04 DIAGNOSIS — I2699 Other pulmonary embolism without acute cor pulmonale: Secondary | ICD-10-CM

## 2012-04-04 DIAGNOSIS — O35BXX0 Maternal care for other (suspected) fetal abnormality and damage, fetal cardiac anomalies, not applicable or unspecified: Secondary | ICD-10-CM

## 2012-04-04 LAB — POCT URINALYSIS DIP (DEVICE)
Glucose, UA: NEGATIVE mg/dL
Ketones, ur: NEGATIVE mg/dL
Leukocytes, UA: NEGATIVE
Specific Gravity, Urine: 1.01 (ref 1.005–1.030)
Urobilinogen, UA: 0.2 mg/dL (ref 0.0–1.0)

## 2012-04-04 LAB — HEPARIN LEVEL (UNFRACTIONATED): Heparin Unfractionated: 0.7 IU/mL (ref 0.3–0.7)

## 2012-04-04 MED ORDER — AZITHROMYCIN 250 MG PO TABS: ORAL_TABLET | ORAL | Status: AC

## 2012-04-04 MED ORDER — ALBUTEROL SULFATE HFA 108 (90 BASE) MCG/ACT IN AERS: 2.0000 | INHALATION_SPRAY | Freq: Four times a day (QID) | RESPIRATORY_TRACT | Status: DC | PRN

## 2012-04-04 MED ORDER — PSEUDOEPHEDRINE HCL 60 MG PO TABS: 60.0000 mg | ORAL_TABLET | ORAL | Status: DC | PRN

## 2012-04-04 NOTE — Progress Notes (Signed)
Missed last appt.  Has not seen MFM either.  Has not had heparin level done.  Will draw today at noon, 4 hours after last lovenox dose.  Pt complaining of SOB with her asthma and URI.  Lungs CTAB after coughing.  No fevers.  Pt needs PFTs.  Continue advair.  Pt had growth [place] on 03/29/12--70%.  C/S is Friday at 2:30.  Pt should not take Lovenox after Thursday a.m. dose.

## 2012-04-04 NOTE — Progress Notes (Signed)
Edema-ankles, feet, hands, and legs. Pulse 97. Pelvic pressure.

## 2012-04-04 NOTE — Patient Instructions (Addendum)
   Your procedure is scheduled on: Friday, April 12th  Enter through the [employer] of [facility] at:  1:00pm Pick up the phone at the desk and dial [phone] and inform us of your arrival.  Please call this number if you have any problems the morning of surgery: [phone]  Remember: Do not eat food after midnight: Thursday Do not drink clear liquids after: 10:00am Friday Take these medicines the morning of surgery with a SIP OF WATER:  Do not wear jewelry, make-up, or FINGER nail polish Do not wear lotions, powders, perfumes or deodorant. Do not shave 48 hours prior to surgery. Do not bring valuables to the hospital. Contacts, dentures or bridgework may not be worn into surgery.  Leave suitcase in the car. After Surgery it may be brought to your room. For patients being admitted to the hospital, checkout time is 11:00am the day of discharge.  Patients discharged on the day of surgery will not be allowed to drive home.     Remember to use your hibiclens as instructed.Please shower with 1/2 bottle the evening before your surgery and the other 1/2 bottle the morning of surgery. Neck down avoiding private area.

## 2012-04-04 NOTE — Progress Notes (Signed)
PFT's scheduled at [facility] on 04/06/12 at 9am.

## 2012-04-04 NOTE — Progress Notes (Signed)
Addended by: Jill Side on: 04/04/2012 12:16 PM   Modules accepted: Orders

## 2012-04-05 ENCOUNTER — Inpatient Hospital Stay (HOSPITAL_COMMUNITY)
Admission: AD | Admit: 2012-04-05 | Discharge: 2012-04-11 | DRG: 766 | Disposition: A | Discharge status: Home / Self Care | Payer: Medicaid Other | Source: Ambulatory Visit | Attending: Obstetrics & Gynecology | Admitting: Obstetrics & Gynecology

## 2012-04-05 ENCOUNTER — Encounter (HOSPITAL_COMMUNITY)
Admission: RE | Admit: 2012-04-05 | Discharge: 2012-04-05 | Disposition: A | Discharge status: Home / Self Care | Payer: Medicaid Other | Source: Ambulatory Visit | Attending: Obstetrics & Gynecology | Admitting: Obstetrics & Gynecology

## 2012-04-05 ENCOUNTER — Encounter (HOSPITAL_COMMUNITY): Payer: Self-pay

## 2012-04-05 ENCOUNTER — Encounter (HOSPITAL_COMMUNITY): Payer: Self-pay | Admitting: General Practice

## 2012-04-05 ENCOUNTER — Inpatient Hospital Stay (HOSPITAL_COMMUNITY): Payer: Medicaid Other

## 2012-04-05 VITALS — BP 126/80 | Ht 67.0 in | Wt 201.0 lb

## 2012-04-05 DIAGNOSIS — O09899 Supervision of other high risk pregnancies, unspecified trimester: Secondary | ICD-10-CM

## 2012-04-05 DIAGNOSIS — Z302 Encounter for sterilization: Secondary | ICD-10-CM

## 2012-04-05 DIAGNOSIS — O99320 Drug use complicating pregnancy, unspecified trimester: Secondary | ICD-10-CM

## 2012-04-05 DIAGNOSIS — O0930 Supervision of pregnancy with insufficient antenatal care, unspecified trimester: Secondary | ICD-10-CM

## 2012-04-05 DIAGNOSIS — N390 Urinary tract infection, site not specified: Secondary | ICD-10-CM

## 2012-04-05 DIAGNOSIS — O34219 Maternal care for unspecified type scar from previous cesarean delivery: Secondary | ICD-10-CM | POA: Diagnosis present

## 2012-04-05 DIAGNOSIS — Z98891 History of uterine scar from previous surgery: Secondary | ICD-10-CM

## 2012-04-05 DIAGNOSIS — Z86711 Personal history of pulmonary embolism: Secondary | ICD-10-CM

## 2012-04-05 DIAGNOSIS — O219 Vomiting of pregnancy, unspecified: Secondary | ICD-10-CM

## 2012-04-05 DIAGNOSIS — O358XX0 Maternal care for other (suspected) fetal abnormality and damage, not applicable or unspecified: Secondary | ICD-10-CM

## 2012-04-05 HISTORY — DX: Shortness of breath: R06.02

## 2012-04-05 HISTORY — DX: Other specified cough: R05.8

## 2012-04-05 HISTORY — DX: Acute upper respiratory infection, unspecified: J06.9

## 2012-04-05 HISTORY — DX: Gestational edema, unspecified trimester: O12.00

## 2012-04-05 HISTORY — DX: Irritant contact dermatitis due to detergents: L24.0

## 2012-04-05 HISTORY — DX: Cough: R05

## 2012-04-05 LAB — PROTIME-INR: Prothrombin Time: 12.9 seconds (ref 11.6–15.2)

## 2012-04-05 LAB — SURGICAL PCR SCREEN: MRSA, PCR: NEGATIVE

## 2012-04-05 LAB — CBC
HCT: 29.9 % — ABNORMAL LOW (ref 36.0–46.0)
MCH: 27.9 pg (ref 26.0–34.0)
MCV: 87.9 fL (ref 78.0–100.0)
Platelets: 275 10*3/uL (ref 150–400)
RBC: 3.4 MIL/uL — ABNORMAL LOW (ref 3.87–5.11)
WBC: 12.5 10*3/uL — ABNORMAL HIGH (ref 4.0–10.5)

## 2012-04-05 LAB — APTT: aPTT: 34 seconds (ref 24–37)

## 2012-04-05 MED ORDER — CALCIUM CARBONATE ANTACID 500 MG PO CHEW
2.0000 | CHEWABLE_TABLET | ORAL | Status: DC | PRN
  Administered 2012-04-08: 400 mg via ORAL
  Filled 2012-04-05 (×2): qty 1

## 2012-04-05 MED ORDER — DOCUSATE SODIUM 100 MG PO CAPS
100.0000 mg | ORAL_CAPSULE | Freq: Every day | ORAL | Status: DC
  Administered 2012-04-06 – 2012-04-08 (×3): 100 mg via ORAL
  Filled 2012-04-05 (×3): qty 1

## 2012-04-05 MED ORDER — ACETAMINOPHEN 325 MG PO TABS
650.0000 mg | ORAL_TABLET | ORAL | Status: DC | PRN
  Administered 2012-04-06: 650 mg via ORAL
  Filled 2012-04-05: qty 2

## 2012-04-05 MED ORDER — AMOXICILLIN 500 MG PO CAPS: 500.0000 mg | ORAL_CAPSULE | Freq: Three times a day (TID) | ORAL | Status: AC

## 2012-04-05 MED ORDER — ZOLPIDEM TARTRATE 10 MG PO TABS
10.0000 mg | ORAL_TABLET | Freq: Every evening | ORAL | Status: DC | PRN
  Administered 2012-04-06: 10 mg via ORAL
  Filled 2012-04-05: qty 1

## 2012-04-05 NOTE — Patient Instructions (Addendum)
YOUR PROCEDURE IS SCHEDULED ON: 04/08/12   ENTER THROUGH THE MAIN ENTRANCE OF [facility] AT:1230pm  USE DESK PHONE AND DIAL [phone] TO INFORM us OF YOUR ARRIVAL  CALL [phone] IF YOU HAVE ANY QUESTIONS OR PROBLEMS PRIOR TO YOUR ARRIVAL.  REMEMBER: DO NOT EAT AFTER MIDNIGHT :Thursday  SPECIAL INSTRUCTIONS:clear liquids ok until 10am Fri   YOU MAY BRUSH YOUR TEETH THE MORNING OF SURGERY   TAKE THESE MEDICINES THE DAY OF SURGERY WITH SIP OF WATER:Nexium, Methadone.... Phenergan or Zofran if needed   DO NOT WEAR JEWELRY, EYE MAKEUP, LIPSTICK OR DARK FINGERNAIL POLISH DO NOT WEAR LOTIONS  DO NOT SHAVE FOR 48 HOURS PRIOR TO SURGERY  YOU WILL NOT BE ALLOWED TO DRIVE YOURSELF HOME.  NAME OF DRIVER: sisterCorrie Dandy

## 2012-04-05 NOTE — Pre-Procedure Instructions (Signed)
Pt scheduled PFTs on 04/06/12 ordered by Dr. Penne Lash

## 2012-04-05 NOTE — H&P (Signed)
[name] is a 21 y.o. female presenting for further eval of nonreassuring fetal testing earlier today. She had a BPP 6/10. She is currently taking Lovenox. She denies leak or bldg. Her preg has been followed by the [facility] since January and has been remarkable for: 1 . Pulmonary emboli  2. Pulmonary edema  3. Lung infarct  4. Clotting disorder w/ positive lupus anticoagulant  5. Previous C/S x 2 with C/S planned for 04/08/12 6. Asthma  7. Depression  8. Late PNC  9. Social isses 10. Methadone    History OB History    Grav Para Term Preterm Abortions TAB SAB Ect Mult Living   3 2 2  0 0 0 0 0 0 2     Past Medical History  Diagnosis Date  . Pulmonary embolism 06/2010    Multiple bilateral pulmonary emboli diagnosed by CT scan in in 06/2010.  Moderate size pleural effusion and right lower lobe infarction present at that time.  Lupus anticoagulant was present, but all other aspects of her hypercoagulable evaluation were negative including all Cardiolite and antibodies.    . Anticoagulant long-term use   . Lupus anticoagulant disorder     Previously followed by Dr. Anastasia Fiedler of discharged from his care due to medical noncompliance.  Marland Kitchen HIV positive     Western Blot negative  . Tobacco abuse   . Anemia   . Substance abuse     cocaine, opiates, marijuana; alcohol; most recent positive test for cocaine was in December of 2011; alcohol levels have been as high as 302  . Anxiety and depression     suicide attempt-age 23  . Attention deficit hyperactivity disorder   . Asthma     ABG in 11/2010:7.45, 34, 79., ventolin rescue inhaler last use 03/21/12  . Anxiety   . Depression   . Panic attacks     not on meds  . Bipolar disorder     not on meds  . Abnormal Pap smear   . GERD (gastroesophageal reflux disease)   . Recurrent upper respiratory infection (URI)   . Cough productive of purulent sputum   . Edema in pregnancy   . Shortness of breath     has asthma- scheduled for PFTs on  04/06/12  . Dermatitis due to detergents    Past Surgical History  Procedure Date  . Cesarean section 2011, 2009  . Wrist surgery 2009    hemoangioma removed from right wrist   Family History: family history includes Drug abuse in her father and mother and Heart attack (age of onset:48) in her father.  There is no history of Anesthesia problems, and Hypotension, and Malignant hyperthermia, and Pseudochol deficiency, . Social History:  reports that she has been smoking Cigarettes.  She has been smoking about .5 packs per day. She has never used smokeless tobacco. She reports that she uses illicit drugs (Marijuana and Other-see comments). She reports that she does not drink alcohol.  ROS    Blood pressure 116/69, pulse 102, temperature 98 F (36.7 C), temperature source Oral, height 5\' 7"  (1.702 m), weight 91.173 kg (201 lb). Maternal Exam:  Uterine Assessment: Rare ctx     Fetal Exam Fetal Monitor Review: Baseline rate: 140.  Variability: minimal (<5 bpm).   occ 10x10 accels; occ mi variables but no decels     Physical Exam  Constitutional: She appears well-developed and well-nourished.  HENT:  Head: Normocephalic.  Cardiovascular: Normal rate.   Respiratory: Effort normal.  Genitourinary:  Exam deferred  Musculoskeletal: Normal range of motion.  Skin: Skin is warm and dry.    BPP 8/8 upon arrival  Prenatal labs: ABO, Rh: O/POS/-- (01/14 1004) Antibody: NEG (01/14 1004) Rubella: 47.8 (01/14 1004) RPR: NON REACTIVE (04/09 1425)  HBsAg: NEGATIVE (01/14 1004)  HIV: NON REACTIVE (01/14 1004)  GBS:     Assessment/Plan: IUP at term Clotting d/o Lovenox Concerns re fetal testing  Admit to antenatal Stable FHR/BPP for now D/C Lovenox Continue daily methadone Review at rounds in AM     [place], [facility] 04/05/2012, 11:47 PM

## 2012-04-05 NOTE — Progress Notes (Incomplete)
NST from 04/04/12 reviewed.  Non reactive.  BPP was ordered by clinic staff.  Result 6/8.  Total BPP score is 6/10.  I was not notified of BPP scores.  Upon review of the chart, patient was called in by on call MD at hospital (Dr. Marice Potter) to come in for monitoring and BPP.

## 2012-04-05 NOTE — Pre-Procedure Instructions (Signed)
Pt's chart shows results in past years 2009, and 2011 for HIV+, and pt denies ever being HIV+. With current pregnancy, HIV test was neg per prenatal labs. Also, Dr. Rodman Pickle notified of pt's current URI and scheduled PFTs tomorrow. Dr. Rodman Pickle wants results reported to [facility] when available.

## 2012-04-06 ENCOUNTER — Inpatient Hospital Stay (HOSPITAL_COMMUNITY): Admission: RE | Admit: 2012-04-06 | Payer: Medicaid Other | Source: Ambulatory Visit

## 2012-04-06 ENCOUNTER — Encounter (HOSPITAL_COMMUNITY): Payer: Medicaid Other

## 2012-04-06 MED ORDER — METHADONE HCL 10 MG/ML PO CONC
100.0000 mg | Freq: Every day | ORAL | Status: DC
  Administered 2012-04-06 – 2012-04-09 (×4): 100 mg via ORAL
  Filled 2012-04-06 (×4): qty 10

## 2012-04-06 MED ORDER — ALBUTEROL SULFATE (5 MG/ML) 0.5% IN NEBU
2.5000 mg | INHALATION_SOLUTION | Freq: Four times a day (QID) | RESPIRATORY_TRACT | Status: DC | PRN
  Administered 2012-04-06 – 2012-04-09 (×7): 2.5 mg via RESPIRATORY_TRACT
  Filled 2012-04-06 (×4): qty 0.5

## 2012-04-06 MED ORDER — AMOXICILLIN 500 MG PO CAPS
500.0000 mg | ORAL_CAPSULE | Freq: Three times a day (TID) | ORAL | Status: DC
  Administered 2012-04-06 – 2012-04-11 (×14): 500 mg via ORAL
  Filled 2012-04-06 (×16): qty 1

## 2012-04-06 MED ORDER — CYCLOBENZAPRINE HCL 10 MG PO TABS
10.0000 mg | ORAL_TABLET | Freq: Three times a day (TID) | ORAL | Status: DC | PRN
  Administered 2012-04-06 – 2012-04-07 (×3): 10 mg via ORAL
  Filled 2012-04-06 (×4): qty 1

## 2012-04-06 MED ORDER — PROMETHAZINE HCL 25 MG PO TABS: 25.0000 mg | ORAL_TABLET | Freq: Four times a day (QID) | ORAL | Status: DC | PRN

## 2012-04-06 MED ORDER — ALBUTEROL SULFATE HFA 108 (90 BASE) MCG/ACT IN AERS
2.0000 | INHALATION_SPRAY | Freq: Four times a day (QID) | RESPIRATORY_TRACT | Status: DC | PRN
  Administered 2012-04-07 – 2012-04-08 (×4): 2 via RESPIRATORY_TRACT
  Filled 2012-04-06: qty 6.7

## 2012-04-06 MED ORDER — PSEUDOEPHEDRINE HCL 30 MG PO TABS: 60.0000 mg | ORAL_TABLET | ORAL | Status: DC | PRN

## 2012-04-06 MED ORDER — PANTOPRAZOLE SODIUM 40 MG PO TBEC
40.0000 mg | DELAYED_RELEASE_TABLET | Freq: Every day | ORAL | Status: DC
  Administered 2012-04-06 – 2012-04-08 (×3): 40 mg via ORAL
  Filled 2012-04-06 (×4): qty 1

## 2012-04-06 MED ORDER — FLUTICASONE-SALMETEROL 100-50 MCG/DOSE IN AEPB
1.0000 | INHALATION_SPRAY | Freq: Two times a day (BID) | RESPIRATORY_TRACT | Status: DC
Start: 2012-04-06 — End: 2012-04-07
  Administered 2012-04-06 – 2012-04-07 (×3): 1 via RESPIRATORY_TRACT
  Filled 2012-04-06: qty 14

## 2012-04-06 MED ORDER — OXYCODONE-ACETAMINOPHEN 5-325 MG PO TABS
2.0000 | ORAL_TABLET | Freq: Once | ORAL | Status: AC
  Administered 2012-04-06: 2 via ORAL
  Filled 2012-04-06: qty 2

## 2012-04-06 MED ORDER — METHADONE HCL 10 MG PO TABS: 100.0000 mg | ORAL_TABLET | Freq: Every day | ORAL | Status: DC

## 2012-04-06 MED ORDER — PRENATAL MULTIVITAMIN CH
1.0000 | ORAL_TABLET | Freq: Every day | ORAL | Status: DC
  Administered 2012-04-06 – 2012-04-08 (×3): 1 via ORAL
  Filled 2012-04-06 (×4): qty 1

## 2012-04-06 MED ORDER — ONDANSETRON HCL 4 MG PO TABS
4.0000 mg | ORAL_TABLET | Freq: Three times a day (TID) | ORAL | Status: DC | PRN
  Administered 2012-04-08: 4 mg via ORAL
  Filled 2012-04-06: qty 1

## 2012-04-06 MED ORDER — AZITHROMYCIN 250 MG PO TABS
250.0000 mg | ORAL_TABLET | Freq: Every day | ORAL | Status: DC
  Administered 2012-04-06 – 2012-04-11 (×6): 250 mg via ORAL
  Filled 2012-04-06 (×6): qty 1

## 2012-04-06 NOTE — Progress Notes (Signed)
UR chart review completed.  

## 2012-04-07 DIAGNOSIS — O0930 Supervision of pregnancy with insufficient antenatal care, unspecified trimester: Secondary | ICD-10-CM

## 2012-04-07 DIAGNOSIS — O09899 Supervision of other high risk pregnancies, unspecified trimester: Secondary | ICD-10-CM

## 2012-04-07 MED ORDER — CEFAZOLIN SODIUM-DEXTROSE 2-3 GM-% IV SOLR
2.0000 g | INTRAVENOUS | Status: DC
  Filled 2012-04-07: qty 50

## 2012-04-07 MED ORDER — HEPARIN SODIUM (PORCINE) 5000 UNIT/ML IJ SOLN: 5000.0000 [IU] | INTRAMUSCULAR | Status: DC

## 2012-04-07 MED ORDER — FLUTICASONE-SALMETEROL 250-50 MCG/DOSE IN AEPB
1.0000 | INHALATION_SPRAY | Freq: Two times a day (BID) | RESPIRATORY_TRACT | Status: DC
  Administered 2012-04-07 – 2012-04-11 (×8): 1 via RESPIRATORY_TRACT
  Filled 2012-04-07: qty 14

## 2012-04-07 MED ORDER — FLUCONAZOLE 150 MG PO TABS
150.0000 mg | ORAL_TABLET | Freq: Once | ORAL | Status: AC
  Administered 2012-04-07: 150 mg via ORAL
  Filled 2012-04-07: qty 1

## 2012-04-07 MED ORDER — LACTATED RINGERS IV SOLN: INTRAVENOUS | Status: DC

## 2012-04-07 MED ORDER — NICOTINE 14 MG/24HR TD PT24
14.0000 mg | MEDICATED_PATCH | Freq: Every day | TRANSDERMAL | Status: DC
  Administered 2012-04-07 – 2012-04-11 (×4): 14 mg via TRANSDERMAL
  Filled 2012-04-07 (×6): qty 1

## 2012-04-07 MED ORDER — BISACODYL 10 MG RE SUPP
10.0000 mg | Freq: Once | RECTAL | Status: AC
  Administered 2012-04-07: 10 mg via RECTAL
  Filled 2012-04-07: qty 1

## 2012-04-07 MED ORDER — CEFAZOLIN SODIUM-DEXTROSE 2-3 GM-% IV SOLR: 2.0000 g | INTRAVENOUS | Status: DC

## 2012-04-07 MED ORDER — ENOXAPARIN SODIUM 100 MG/ML ~~LOC~~ SOLN
85.0000 mg | Freq: Two times a day (BID) | SUBCUTANEOUS | Status: AC
  Administered 2012-04-07: 85 mg via SUBCUTANEOUS
  Filled 2012-04-07: qty 1

## 2012-04-07 NOTE — Progress Notes (Signed)
Patient ID: [name], female   DOB: 05/17/1991, 21 y.o.   MRN: [id number] FACULTY PRACTICE ANTEPARTUM(COMPREHENSIVE) NOTE  [name] is a 21 y.o. G3P2002 at [redacted]w[redacted]d who is admitted for NRNST.   Fetal presentation is cephalic. Length of Stay:  2  Days  Subjective: Patient reports doing well.  Patient reports the fetal movement as active. Patient reports uterine contraction  activity as none. Patient reports  vaginal bleeding as none. Patient describes fluid per vagina as None.  Vitals:  Blood pressure 122/76, pulse 83, temperature 98.3 F (36.8 C), temperature source Oral, resp. rate 22, height 5\' 7"  (1.702 m), weight 92.67 kg (204 lb 4.8 oz), SpO2 98.00%. Physical Examination:  General appearance - alert, well appearing, and in no distress Fundal Height:  size equals dates Pelvic Exam:  normal external genitalia, vulva, vagina, cervix, uterus and adnexa, examination not indicated Cervical Exam: Not evaluated. Extremities: extremities normal, atraumatic, no cyanosis or edema, Homans sign is negative, no sign of DVT and no edema, redness or tenderness in the calves or thighs with DTRs 2+ bilaterally Membranes:intact  Fetal Monitoring:  Baseline: 145 bpm, Variability: Good {> 6 bpm), Accelerations: Reactive and Decelerations: Absent Toco: no contractions  Labs:  No results found for this or any previous visit (from the past 24 hour(s)).  Imaging Studies:      Medications:  Scheduled    . amoxicillin  500 mg Oral TID  . azithromycin  250 mg Oral Daily  . docusate sodium  100 mg Oral Daily  . enoxaparin  85 mg Subcutaneous Q12H  . Fluticasone-Salmeterol  1 puff Inhalation BID  . methadone  100 mg Oral Daily  . oxyCODONE-acetaminophen  2 tablet Oral Once  . pantoprazole  40 mg Oral Daily  . prenatal multivitamin  1 tablet Oral Daily  . DISCONTD:  ceFAZolin (ANCEF) IV  2 g Intravenous On Call to OR  . DISCONTD: heparin subcutaneous  5,000 Units Subcutaneous 120 min  pre-op   I have reviewed the patient's current medications.  ASSESSMENT: Patient Active Problem List  Diagnoses  . ANXIETY  . TOBACCO ABUSE  . DEPRESSION  . PULMONARY EMBOLISM  . ASTHMA  . Anticoagulant long-term use  . History of drug abuse  . Antiphospholipid antibody syndrome  . History of cesarean section, low transverse  . Supervision of other high-risk pregnancy  . Echogenic focus of heart, fetal, affecting care of mother, antepartum  . Back pain  . Somatic dysfunction  . Drug dependence, antepartum    PLAN: Fetal status reassuring Patient to receive morning dose of Lovenox Plan for repeat c-section with BTL 04/08/2012 Continue current care  Muadh Creasy 04/07/2012,9:19 AM

## 2012-04-07 NOTE — Pre-Procedure Instructions (Signed)
Searched EPIC records for PFT results and saw note that pt didn't show up for her appt. Marnie, in Dr. Bertram Denver office, notified. Dr. Rodman Pickle also notified.

## 2012-04-08 ENCOUNTER — Encounter (HOSPITAL_COMMUNITY): Payer: Self-pay | Admitting: Anesthesiology

## 2012-04-08 ENCOUNTER — Inpatient Hospital Stay (HOSPITAL_COMMUNITY): Payer: Medicaid Other

## 2012-04-08 ENCOUNTER — Encounter (HOSPITAL_COMMUNITY): Payer: Self-pay | Admitting: *Deleted

## 2012-04-08 ENCOUNTER — Encounter (HOSPITAL_COMMUNITY)
Admission: AD | Disposition: A | Discharge status: Home / Self Care | Payer: Self-pay | Source: Ambulatory Visit | Attending: Obstetrics & Gynecology

## 2012-04-08 ENCOUNTER — Encounter (HOSPITAL_COMMUNITY): Payer: Self-pay

## 2012-04-08 ENCOUNTER — Inpatient Hospital Stay (HOSPITAL_COMMUNITY)
Admission: RE | Admit: 2012-04-08 | Payer: Medicaid Other | Source: Ambulatory Visit | Admitting: Obstetrics & Gynecology

## 2012-04-08 DIAGNOSIS — Z7901 Long term (current) use of anticoagulants: Secondary | ICD-10-CM

## 2012-04-08 DIAGNOSIS — O34219 Maternal care for unspecified type scar from previous cesarean delivery: Secondary | ICD-10-CM

## 2012-04-08 DIAGNOSIS — D682 Hereditary deficiency of other clotting factors: Secondary | ICD-10-CM

## 2012-04-08 DIAGNOSIS — F1121 Opioid dependence, in remission: Secondary | ICD-10-CM

## 2012-04-08 DIAGNOSIS — Z302 Encounter for sterilization: Secondary | ICD-10-CM

## 2012-04-08 HISTORY — PX: TUBAL LIGATION: SHX77

## 2012-04-08 LAB — CBC
HCT: 27.7 % — ABNORMAL LOW (ref 36.0–46.0)
Hemoglobin: 8.8 g/dL — ABNORMAL LOW (ref 12.0–15.0)
MCHC: 31.8 g/dL (ref 30.0–36.0)
MCV: 87.7 fL (ref 78.0–100.0)

## 2012-04-08 LAB — ABO/RH: ABO/RH(D): O POS

## 2012-04-08 LAB — TYPE AND SCREEN: Antibody Screen: NEGATIVE

## 2012-04-08 SURGERY — Surgical Case
Anesthesia: Spinal | Site: Uterus | Wound class: Clean Contaminated

## 2012-04-08 SURGERY — Surgical Case
Anesthesia: Regional

## 2012-04-08 MED ORDER — SIMETHICONE 80 MG PO CHEW
80.0000 mg | CHEWABLE_TABLET | Freq: Three times a day (TID) | ORAL | Status: DC
  Administered 2012-04-08 – 2012-04-11 (×9): 80 mg via ORAL

## 2012-04-08 MED ORDER — DIPHENHYDRAMINE HCL 25 MG PO CAPS: 25.0000 mg | ORAL_CAPSULE | Freq: Four times a day (QID) | ORAL | Status: DC | PRN

## 2012-04-08 MED ORDER — MORPHINE SULFATE (PF) 0.5 MG/ML IJ SOLN
INTRAMUSCULAR | Status: DC | PRN
  Administered 2012-04-08: .1 mg via INTRATHECAL

## 2012-04-08 MED ORDER — LACTATED RINGERS IV SOLN
INTRAVENOUS | Status: DC | PRN
  Administered 2012-04-08 (×2): via INTRAVENOUS

## 2012-04-08 MED ORDER — SIMETHICONE 80 MG PO CHEW
80.0000 mg | CHEWABLE_TABLET | ORAL | Status: DC | PRN
Start: 2012-04-08 — End: 2012-04-11

## 2012-04-08 MED ORDER — ONDANSETRON HCL 4 MG/2ML IJ SOLN: 4.0000 mg | Freq: Three times a day (TID) | INTRAMUSCULAR | Status: DC | PRN

## 2012-04-08 MED ORDER — SENNOSIDES-DOCUSATE SODIUM 8.6-50 MG PO TABS
2.0000 | ORAL_TABLET | Freq: Every day | ORAL | Status: DC
  Administered 2012-04-08 – 2012-04-10 (×3): 2 via ORAL

## 2012-04-08 MED ORDER — OXYTOCIN 20 UNITS IN LACTATED RINGERS INFUSION - SIMPLE
INTRAVENOUS | Status: DC | PRN
  Administered 2012-04-08: 20 [IU] via INTRAVENOUS

## 2012-04-08 MED ORDER — LACTATED RINGERS IV SOLN
INTRAVENOUS | Status: DC
  Administered 2012-04-08: 10:00:00 via INTRAVENOUS

## 2012-04-08 MED ORDER — HYDROMORPHONE 0.3 MG/ML IV SOLN
INTRAVENOUS | Status: AC
  Filled 2012-04-08: qty 25

## 2012-04-08 MED ORDER — ONDANSETRON HCL 4 MG/2ML IJ SOLN: 4.0000 mg | Freq: Four times a day (QID) | INTRAMUSCULAR | Status: DC | PRN

## 2012-04-08 MED ORDER — CEFAZOLIN SODIUM 1-5 GM-% IV SOLN
INTRAVENOUS | Status: AC
  Filled 2012-04-08: qty 50

## 2012-04-08 MED ORDER — SODIUM CHLORIDE 0.9 % IJ SOLN: 9.0000 mL | INTRAMUSCULAR | Status: DC | PRN

## 2012-04-08 MED ORDER — ENOXAPARIN SODIUM 100 MG/ML ~~LOC~~ SOLN
90.0000 mg | Freq: Two times a day (BID) | SUBCUTANEOUS | Status: DC
  Administered 2012-04-09: 100 mg via SUBCUTANEOUS
  Administered 2012-04-09: 90 mg via SUBCUTANEOUS
  Administered 2012-04-10 (×2): via SUBCUTANEOUS
  Administered 2012-04-11: 90 mg via SUBCUTANEOUS
  Filled 2012-04-08 (×5): qty 1

## 2012-04-08 MED ORDER — BUPIVACAINE IN DEXTROSE 0.75-8.25 % IT SOLN
INTRATHECAL | Status: DC | PRN
  Administered 2012-04-08: 11.75 mL via INTRATHECAL

## 2012-04-08 MED ORDER — LACTATED RINGERS IV SOLN
INTRAVENOUS | Status: DC
  Administered 2012-04-09: via INTRAVENOUS

## 2012-04-08 MED ORDER — KETOROLAC TROMETHAMINE 30 MG/ML IJ SOLN: 30.0000 mg | Freq: Four times a day (QID) | INTRAMUSCULAR | Status: AC | PRN

## 2012-04-08 MED ORDER — OXYTOCIN 20 UNITS IN LACTATED RINGERS INFUSION - SIMPLE: 125.0000 mL/h | INTRAVENOUS | Status: AC

## 2012-04-08 MED ORDER — KETOROLAC TROMETHAMINE 30 MG/ML IJ SOLN
30.0000 mg | Freq: Four times a day (QID) | INTRAMUSCULAR | Status: AC | PRN
  Administered 2012-04-08: 30 mg via INTRAVENOUS
  Filled 2012-04-08: qty 1

## 2012-04-08 MED ORDER — FENTANYL CITRATE 0.05 MG/ML IJ SOLN
INTRAMUSCULAR | Status: DC | PRN
  Administered 2012-04-08: 15 ug via INTRATHECAL

## 2012-04-08 MED ORDER — HYDROMORPHONE HCL PF 1 MG/ML IJ SOLN
INTRAMUSCULAR | Status: AC
  Filled 2012-04-08: qty 1

## 2012-04-08 MED ORDER — ONDANSETRON HCL 4 MG/2ML IJ SOLN
INTRAMUSCULAR | Status: AC
  Filled 2012-04-08: qty 2

## 2012-04-08 MED ORDER — OXYCODONE-ACETAMINOPHEN 5-325 MG PO TABS
1.0000 | ORAL_TABLET | ORAL | Status: DC | PRN
  Administered 2012-04-09: 2 via ORAL
  Administered 2012-04-09: 1 via ORAL
  Administered 2012-04-09: 2 via ORAL
  Administered 2012-04-09: 1 via ORAL
  Administered 2012-04-09 – 2012-04-10 (×5): 2 via ORAL
  Administered 2012-04-10: 1 via ORAL
  Administered 2012-04-10 – 2012-04-11 (×4): 2 via ORAL
  Filled 2012-04-08: qty 2
  Filled 2012-04-08 (×2): qty 1
  Filled 2012-04-08 (×9): qty 2
  Filled 2012-04-08: qty 1
  Filled 2012-04-08 (×2): qty 2

## 2012-04-08 MED ORDER — METOCLOPRAMIDE HCL 5 MG/ML IJ SOLN: 10.0000 mg | Freq: Three times a day (TID) | INTRAMUSCULAR | Status: DC | PRN

## 2012-04-08 MED ORDER — LANOLIN HYDROUS EX OINT: 1.0000 "application " | TOPICAL_OINTMENT | CUTANEOUS | Status: DC | PRN

## 2012-04-08 MED ORDER — BUPIVACAINE HCL (PF) 0.25 % IJ SOLN
INTRAMUSCULAR | Status: DC | PRN
  Administered 2012-04-08: 30 mL

## 2012-04-08 MED ORDER — HYDROMORPHONE HCL PF 1 MG/ML IJ SOLN
0.2500 mg | INTRAMUSCULAR | Status: DC | PRN
  Administered 2012-04-08 (×2): 0.5 mg via INTRAVENOUS

## 2012-04-08 MED ORDER — CITRIC ACID-SODIUM CITRATE 334-500 MG/5ML PO SOLN
ORAL | Status: AC
  Administered 2012-04-08: 30 mL
  Filled 2012-04-08: qty 15

## 2012-04-08 MED ORDER — NALBUPHINE HCL 10 MG/ML IJ SOLN: 5.0000 mg | INTRAMUSCULAR | Status: DC | PRN

## 2012-04-08 MED ORDER — TETANUS-DIPHTH-ACELL PERTUSSIS 5-2.5-18.5 LF-MCG/0.5 IM SUSP
0.5000 mL | Freq: Once | INTRAMUSCULAR | Status: AC
  Administered 2012-04-09: 0.5 mL via INTRAMUSCULAR
  Filled 2012-04-08: qty 0.5

## 2012-04-08 MED ORDER — IBUPROFEN 600 MG PO TABS
600.0000 mg | ORAL_TABLET | Freq: Four times a day (QID) | ORAL | Status: DC | PRN
  Filled 2012-04-08 (×10): qty 1

## 2012-04-08 MED ORDER — BUPIVACAINE HCL (PF) 0.25 % IJ SOLN
INTRAMUSCULAR | Status: AC
  Filled 2012-04-08: qty 30

## 2012-04-08 MED ORDER — WITCH HAZEL-GLYCERIN EX PADS: 1.0000 "application " | MEDICATED_PAD | CUTANEOUS | Status: DC | PRN

## 2012-04-08 MED ORDER — PRENATAL MULTIVITAMIN CH
1.0000 | ORAL_TABLET | Freq: Every day | ORAL | Status: DC
  Administered 2012-04-09 – 2012-04-11 (×2): 1 via ORAL
  Filled 2012-04-08: qty 1

## 2012-04-08 MED ORDER — NALOXONE HCL 0.4 MG/ML IJ SOLN: 0.4000 mg | INTRAMUSCULAR | Status: DC | PRN

## 2012-04-08 MED ORDER — CEFAZOLIN SODIUM 1-5 GM-% IV SOLN
INTRAVENOUS | Status: DC | PRN
  Administered 2012-04-08: 1 g via INTRAVENOUS

## 2012-04-08 MED ORDER — HYDROMORPHONE 0.3 MG/ML IV SOLN
INTRAVENOUS | Status: DC
  Administered 2012-04-08 (×2): via INTRAVENOUS
  Administered 2012-04-08: 5.9 mg via INTRAVENOUS
  Administered 2012-04-09: 1.99 mg via INTRAVENOUS
  Administered 2012-04-09: 0.999 mg via INTRAVENOUS
  Administered 2012-04-09: 2.9 mg via INTRAVENOUS

## 2012-04-08 MED ORDER — IBUPROFEN 600 MG PO TABS
600.0000 mg | ORAL_TABLET | Freq: Four times a day (QID) | ORAL | Status: DC
  Administered 2012-04-09 – 2012-04-11 (×10): 600 mg via ORAL

## 2012-04-08 MED ORDER — DIPHENHYDRAMINE HCL 50 MG/ML IJ SOLN: 25.0000 mg | INTRAMUSCULAR | Status: DC | PRN

## 2012-04-08 MED ORDER — FENTANYL CITRATE 0.05 MG/ML IJ SOLN
INTRAMUSCULAR | Status: AC
  Filled 2012-04-08: qty 2

## 2012-04-08 MED ORDER — OXYTOCIN 10 UNIT/ML IJ SOLN
INTRAMUSCULAR | Status: AC
  Filled 2012-04-08: qty 2

## 2012-04-08 MED ORDER — DIBUCAINE 1 % RE OINT: 1.0000 "application " | TOPICAL_OINTMENT | RECTAL | Status: DC | PRN

## 2012-04-08 MED ORDER — DIPHENHYDRAMINE HCL 25 MG PO CAPS
25.0000 mg | ORAL_CAPSULE | ORAL | Status: DC | PRN
  Administered 2012-04-09: 25 mg via ORAL
  Filled 2012-04-08 (×2): qty 1

## 2012-04-08 MED ORDER — DIPHENHYDRAMINE HCL 12.5 MG/5ML PO ELIX
12.5000 mg | ORAL_SOLUTION | Freq: Four times a day (QID) | ORAL | Status: DC | PRN
  Filled 2012-04-08: qty 5

## 2012-04-08 MED ORDER — ONDANSETRON HCL 4 MG/2ML IJ SOLN
INTRAMUSCULAR | Status: DC | PRN
  Administered 2012-04-08: 4 mg via INTRAVENOUS

## 2012-04-08 MED ORDER — DIPHENHYDRAMINE HCL 50 MG/ML IJ SOLN: 12.5000 mg | INTRAMUSCULAR | Status: DC | PRN

## 2012-04-08 MED ORDER — OXYTOCIN 20 UNITS IN LACTATED RINGERS INFUSION - SIMPLE
INTRAVENOUS | Status: AC
  Filled 2012-04-08: qty 1000

## 2012-04-08 MED ORDER — ONDANSETRON HCL 4 MG/2ML IJ SOLN: 4.0000 mg | INTRAMUSCULAR | Status: DC | PRN

## 2012-04-08 MED ORDER — ONDANSETRON HCL 4 MG PO TABS
4.0000 mg | ORAL_TABLET | ORAL | Status: DC | PRN
  Administered 2012-04-10: 4 mg via ORAL
  Filled 2012-04-08: qty 1

## 2012-04-08 MED ORDER — SODIUM CHLORIDE 0.9 % IJ SOLN: 3.0000 mL | INTRAMUSCULAR | Status: DC | PRN

## 2012-04-08 MED ORDER — MENTHOL 3 MG MT LOZG: 1.0000 | LOZENGE | OROMUCOSAL | Status: DC | PRN

## 2012-04-08 MED ORDER — ZOLPIDEM TARTRATE 5 MG PO TABS: 5.0000 mg | ORAL_TABLET | Freq: Every evening | ORAL | Status: DC | PRN

## 2012-04-08 MED ORDER — SODIUM CHLORIDE 0.9 % IV SOLN: 1.0000 ug/kg/h | INTRAVENOUS | Status: DC | PRN

## 2012-04-08 MED ORDER — KETOROLAC TROMETHAMINE 60 MG/2ML IM SOLN
INTRAMUSCULAR | Status: AC
  Filled 2012-04-08: qty 2

## 2012-04-08 MED ORDER — DIPHENHYDRAMINE HCL 50 MG/ML IJ SOLN: 12.5000 mg | Freq: Four times a day (QID) | INTRAMUSCULAR | Status: DC | PRN

## 2012-04-08 MED ORDER — MORPHINE SULFATE 0.5 MG/ML IJ SOLN
INTRAMUSCULAR | Status: AC
  Filled 2012-04-08: qty 10

## 2012-04-08 MED ORDER — SCOPOLAMINE 1 MG/3DAYS TD PT72: 1.0000 | MEDICATED_PATCH | Freq: Once | TRANSDERMAL | Status: DC

## 2012-04-08 MED ORDER — MEPERIDINE HCL 25 MG/ML IJ SOLN: 6.2500 mg | INTRAMUSCULAR | Status: DC | PRN

## 2012-04-08 MED ORDER — KETOROLAC TROMETHAMINE 60 MG/2ML IM SOLN
60.0000 mg | Freq: Once | INTRAMUSCULAR | Status: AC | PRN
  Administered 2012-04-08: 60 mg via INTRAMUSCULAR

## 2012-04-08 SURGICAL SUPPLY — 35 items
APL SKNCLS STERI-STRIP NONHPOA (GAUZE/BANDAGES/DRESSINGS) ×2
BENZOIN TINCTURE PRP APPL 2/3 (GAUZE/BANDAGES/DRESSINGS) ×1 IMPLANT
CHLORAPREP W/TINT 26ML (MISCELLANEOUS) ×3 IMPLANT
CLIP FILSHIE TUBAL LIGA STRL (Clip) ×1 IMPLANT
CLOTH BEACON ORANGE TIMEOUT ST (SAFETY) ×3 IMPLANT
DRESSING TELFA 8X3 (GAUZE/BANDAGES/DRESSINGS) ×3 IMPLANT
ELECT REM PT RETURN 9FT ADLT (ELECTROSURGICAL) ×3
ELECTRODE REM PT RTRN 9FT ADLT (ELECTROSURGICAL) ×2 IMPLANT
EXTRACTOR VACUUM M CUP 4 TUBE (SUCTIONS) IMPLANT
GAUZE SPONGE 4X4 12PLY STRL LF (GAUZE/BANDAGES/DRESSINGS) ×6 IMPLANT
GLOVE BIOGEL PI IND STRL 7.0 (GLOVE) ×2 IMPLANT
GLOVE BIOGEL PI INDICATOR 7.0 (GLOVE) ×1
GLOVE ECLIPSE 7.0 STRL STRAW (GLOVE) ×6 IMPLANT
GOWN PREVENTION PLUS LG XLONG (DISPOSABLE) ×6 IMPLANT
GOWN PREVENTION PLUS XLARGE (GOWN DISPOSABLE) ×3 IMPLANT
KIT ABG SYR 3ML LUER SLIP (SYRINGE) IMPLANT
NDL HYPO 25X5/8 SAFETYGLIDE (NEEDLE) IMPLANT
NEEDLE HYPO 22GX1.5 SAFETY (NEEDLE) ×3 IMPLANT
NEEDLE HYPO 25X5/8 SAFETYGLIDE (NEEDLE) IMPLANT
NS IRRIG 1000ML POUR BTL (IV SOLUTION) ×3 IMPLANT
PACK C SECTION WH (CUSTOM PROCEDURE TRAY) ×3 IMPLANT
PAD ABD 7.5X8 STRL (GAUZE/BANDAGES/DRESSINGS) ×1 IMPLANT
RTRCTR C-SECT PINK 25CM LRG (MISCELLANEOUS) IMPLANT
SLEEVE SCD COMPRESS KNEE MED (MISCELLANEOUS) IMPLANT
SPONGE GAUZE 4X4 12PLY (GAUZE/BANDAGES/DRESSINGS) ×1 IMPLANT
STAPLER VISISTAT 35W (STAPLE) IMPLANT
STRIP CLOSURE SKIN 1/2X4 (GAUZE/BANDAGES/DRESSINGS) ×1 IMPLANT
SUT VIC AB 0 CTX 36 (SUTURE) ×9
SUT VIC AB 0 CTX36XBRD ANBCTRL (SUTURE) ×6 IMPLANT
SUT VIC AB 4-0 KS 27 (SUTURE) IMPLANT
SYR 30ML LL (SYRINGE) ×3 IMPLANT
TAPE CLOTH SURG 4X10 WHT LF (GAUZE/BANDAGES/DRESSINGS) ×1 IMPLANT
TOWEL OR 17X24 6PK STRL BLUE (TOWEL DISPOSABLE) ×6 IMPLANT
TRAY FOLEY CATH 14FR (SET/KITS/TRAYS/PACK) ×3 IMPLANT
WATER STERILE IRR 1000ML POUR (IV SOLUTION) ×3 IMPLANT

## 2012-04-08 NOTE — Op Note (Signed)
[name] PROCEDURE DATE: 04/05/2012 - 04/08/2012  PREOPERATIVE DIAGNOSIS: Intrauterine pregnancy at  [redacted]w[redacted]d weeks gestation; elective repeat  POSTOPERATIVE DIAGNOSIS: The same  PROCEDURE: Repeat Low Transverse Cesarean Section and bilateral tubal ligation  SURGEON:  Dr. Tinnie Gens  ASSISTANT: Dr Candelaria Celeste  INDICATIONS: [name] is a 21 y.o. [id number] at [redacted]w[redacted]d scheduled for cesarean section secondary to elective repeat.  The risks of cesarean section discussed with the patient included but were not limited to: bleeding which may require transfusion or reoperation; infection which may require antibiotics; injury to bowel, bladder, ureters or other surrounding organs; injury to the fetus; need for additional procedures including hysterectomy in the event of a life-threatening hemorrhage; placental abnormalities wth subsequent pregnancies, incisional problems, thromboembolic phenomenon and other postoperative/anesthesia complications. The patient concurred with the proposed plan, giving informed written consent for the procedure.    FINDINGS:  Viable female infant in cephalic presentation.  Apgars 8 and 9, weight, 7 pounds and 2 ounces.  Clear amniotic fluid.  Intact placenta, three vessel cord.  Normal uterus, fallopian tubes and ovaries bilaterally.  ANESTHESIA:    Spinal INTRAVENOUS FLUIDS:1500 ml ESTIMATED BLOOD LOSS: 600 ml URINE OUTPUT:  200 ml SPECIMENS: Placenta sent to L&D COMPLICATIONS: None immediate  PROCEDURE IN DETAIL:  The patient received intravenous antibiotics and had sequential compression devices applied to her lower extremities while in the preoperative area.  She was then taken to the operating room where spinal anesthesia was administered and was found to be adequate. She was then placed in a dorsal supine position with a leftward tilt, and prepped and draped in a sterile manner.  A foley catheter was placed into her bladder and attached to constant gravity,  which drained clear fluid throughout.  After an adequate timeout was performed, a Pfannenstiel skin incision was made with scalpel and carried through to the underlying layer of fascia. The fascia was incised in the midline and this incision was extended bilaterally using the Mayo scissors. Kocher clamps were applied to the superior aspect of the fascial incision and the underlying rectus muscles were dissected off bluntly. A similar process was carried out on the inferior aspect of the facial incision. The rectus muscles were separated in the midline bluntly and the peritoneum was entered bluntly. An Alexis retractor was placed.  Attention was turned to the lower uterine segment where a transverse hysterotomy was made with a scalpel and extended bilaterally bluntly. The infant was successfully delivered, and cord was clamped and cut and infant was handed over to awaiting neonatology team. Uterine massage was then administered and the placenta delivered intact with three-vessel cord. The uterus was then cleared of clot and debris.  The hysterotomy was closed with 0 Vicryl in a running locked fashion in a single layer. Overall, excellent hemostasis was noted.  Attention was then turned to the fallopian tubes.  A Filshie clip was placed on both tubes, about 2 cm from the cornua, with care given to incorporate the underlying mesosalpinx on both sides, allowing for bilateral tubal sterilization.  The abdomen and the pelvis were cleared of all clot and debris. Hemostasis was confirmed on all surfaces.  The rectus muscles were reapproximated using 0 vicryl running stitches. The fascia was then closed using 0 Vicryl in a running fashion.  Tthe skin was closed with 4-0 Vicryl. The patient tolerated the procedure well. Sponge, lap, instrument and needle counts were correct x 2. She was taken to the recovery room in stable condition.  Candelaria Celeste JEHIEL DO 04/08/2012 3:55 PM

## 2012-04-08 NOTE — Anesthesia Procedure Notes (Signed)
Spinal  Patient location during procedure: OR Start time: 04/08/2012 3:01 PM Staffing Anesthesiologist: CASSIDY, AMY Performed by: anesthesiologist  Preanesthetic Checklist Completed: patient identified, site marked, surgical consent, pre-op evaluation, timeout performed, IV checked, risks and benefits discussed and monitors and equipment checked Spinal Block Patient position: sitting Prep: site prepped and draped and DuraPrep Patient monitoring: heart rate, cardiac monitor, continuous pulse ox and blood pressure Approach: midline Location: L3-4 Injection technique: single-shot Needle Needle type: Sprotte  Needle gauge: 24 G Needle length: 9 cm Assessment Sensory level: T4 Additional Notes Clear free flow CSF on first attempt.  No paresthesia.  Patient tolerated procedure well.  Mervyn Skeeters Cassidy< MD

## 2012-04-08 NOTE — Interval H&P Note (Signed)
History and Physical Interval Note:  04/08/2012 2:15 PM  [name]  has presented today for surgery, with the diagnosis of Repeat section at 39 weeks and desires steriliztion  The various methods of treatment have been discussed with the patient and family. After consideration of risks, benefits and other options for treatment, the patient has consented to  Procedure(Russell) (LRB): CESAREAN SECTION WITH BILATERAL TUBAL LIGATION (N/A) as a surgical intervention .  The patients' history has been reviewed, patient examined, no change in status, stable for surgery.  I have reviewed the patients' chart and labs.  Questions were answered to the patient'Russell satisfaction.  Patient counseled, r.e. Risks benefits of BTL, including permanency of procedure, risk of failure(1:100), increased risk of ectopic.  Patient verbalized understanding and desires to proceed    [name]

## 2012-04-08 NOTE — Progress Notes (Signed)
Patient ID: LAASYA PEYTON, female   DOB: [birth year], 21 y.o.   MRN: [id number]  FACULTY PRACTICE ANTEPARTUM(COMPREHENSIVE) NOTE  AYDIN CAVALIERI is a 21 y.o. G3P2002 at [redacted]w[redacted]d  who is admitted for non reactive FHT on antenatal testing and asthma exacerbation / uri .   Length of Stay:  3  Days  Subjective:  Patient reports the fetal movement as active. Patient reports uterine contraction  activity as none. Patient reports  vaginal bleeding as none. Patient describes fluid per vagina as None.  Vitals:  Blood pressure 114/71, pulse 91, temperature 98 F (36.7 C), temperature source Oral, resp. rate 18, height 5\' 7"  (1.702 m), weight 92.67 kg (204 lb 4.8 oz), SpO2 100.00%. Physical Examination:  General appearance - alert, well appearing, and in no distress Abdomen - soft, nontender, nondistended, no masses or organomegaly Extremities - pedal edema, no signs DVT,.  SCDs in place  Fetal Monitoring:  Baseline: 130 bpm, Variability: Good {> 6 bpm), Accelerations: Reactive and Decelerations: Absent  Labs:  Recent Results (from the past 24 hour(s))  CBC   Collection Time   04/08/12  5:50 AM      Component Value Range   WBC 9.4  4.0 - 10.5 (K/uL)   RBC 3.16 (*) 3.87 - 5.11 (MIL/uL)   Hemoglobin 8.8 (*) 12.0 - 15.0 (g/dL)   HCT 40.9 (*) 81.1 - 46.0 (%)   MCV 87.7  78.0 - 100.0 (fL)   MCH 27.8  26.0 - 34.0 (pg)   MCHC 31.8  30.0 - 36.0 (g/dL)   RDW 91.4  78.2 - 95.6 (%)   Platelets 253  150 - 400 (K/uL)    Imaging Studies:     Medications:  Scheduled    . amoxicillin  500 mg Oral TID  . azithromycin  250 mg Oral Daily  . bisacodyl  10 mg Rectal Once  . docusate sodium  100 mg Oral Daily  . enoxaparin  85 mg Subcutaneous Q12H  . fluconazole  150 mg Oral Once  . Fluticasone-Salmeterol  1 puff Inhalation BID  . methadone  100 mg Oral Daily  . nicotine  14 mg Transdermal Daily  . pantoprazole  40 mg Oral Daily  . prenatal multivitamin  1 tablet Oral Daily  . DISCONTD:  Fluticasone-Salmeterol  1 puff Inhalation BID     ASSESSMENT: Patient Active Problem List  Diagnoses  . ANXIETY  . TOBACCO ABUSE  . DEPRESSION  . PULMONARY EMBOLISM  . ASTHMA  . Anticoagulant long-term use  . History of drug abuse  . Antiphospholipid antibody syndrome  . History of cesarean section, low transverse  . Supervision of other high-risk pregnancy  . Echogenic focus of heart, fetal, affecting care of mother, antepartum  . Back pain  . Somatic dysfunction  . Drug dependence, antepartum    PLAN: G3 P2 at 39 weeks for rpt c/s.  1-PFT this am 2-Lovenox stopped yesterday morning 3-NPO per anesthesia guidelines  Darianne Muralles H. 04/08/2012,6:22 AM

## 2012-04-08 NOTE — Anesthesia Preprocedure Evaluation (Addendum)
Anesthesia Evaluation  Patient identified by MRN, date of birth, ID band Patient awake    Reviewed: Allergy & Precautions, H&P , NPO status , Patient's Chart, lab work & pertinent test results, reviewed documented beta blocker date and time   History of Anesthesia Complications Negative for: history of anesthetic complications  Airway Mallampati: I TM Distance: >3 FB Neck ROM: full    Dental  (+) Teeth Intact   Pulmonary shortness of breath (with hills and stairs during pregnancy), asthma (daily inhaler use, multiple hospitalizations since age 25 yo, one episode of ventilation at 21 yo.Marland Kitchen  Recent PFTs show moderate obstructive disease) , Recent URI  (recent cold 1.5 weeks ago.  Still productive cough, clearing now.  Finished antibiotics today), Current Smoker,  Multiple pulmonary emboli 06/2010.  Lupus anticoagulant present.  Has been on lovenox breath sounds clear to auscultation        Cardiovascular negative cardio ROS  Rhythm:regular Rate:Normal     Neuro/Psych PSYCHIATRIC DISORDERS (ADD, anxiety, panic attacks, depression, h/o suicide attempt at age 14, bipolar) negative neurological ROS     GI/Hepatic GERD-  Medicated and Controlled,(+)     substance abuse (on methadone)  alcohol use, cocaine use and marijuana use,   Endo/Other  negative endocrine ROS  Renal/GU negative Renal ROS  negative genitourinary   Musculoskeletal   Abdominal   Peds  Hematology negative hematology ROS (+)   Anesthesia Other Findings   Reproductive/Obstetrics (+) Pregnancy (h/o c/s x2)                         Anesthesia Physical Anesthesia Plan  ASA: III  Anesthesia Plan: Spinal   Post-op Pain Management:    Induction:   Airway Management Planned:   Additional Equipment:   Intra-op Plan:   Post-operative Plan:   Informed Consent: I have reviewed the patients History and Physical, chart, labs and discussed  the procedure including the risks, benefits and alternatives for the proposed anesthesia with the patient or authorized representative who has indicated his/her understanding and acceptance.   Dental Advisory Given  Plan Discussed with: Surgeon and CRNA  Anesthesia Plan Comments:         Anesthesia Quick Evaluation

## 2012-04-08 NOTE — Progress Notes (Signed)
PFT done. Unconfirmed copy faxed to nursing unit fax number [phone].  Inocente Salles RRT, RCP 04/08/2012 1:30 PM

## 2012-04-08 NOTE — Anesthesia Postprocedure Evaluation (Signed)
Anesthesia Post Note  Patient: [name]  Procedure(s) Performed: Procedure(s) (LRB): CESAREAN SECTION (N/A) BILATERAL TUBAL LIGATION (Bilateral)  Anesthesia type: Spinal  Patient location: PACU  Post pain: Pain level controlled  Post assessment: Post-op Vital signs reviewed  Last Vitals:  Filed Vitals:   04/08/12 1711  BP: 132/84  Pulse: 67  Temp: 36.6 C  Resp: 18    Post vital signs: Reviewed  Level of consciousness: awake  Complications: No apparent anesthesia complications

## 2012-04-08 NOTE — Transfer of Care (Signed)
Immediate Anesthesia Transfer of Care Note  Patient: [name]  Procedure(s) Performed: Procedure(s) (LRB): CESAREAN SECTION (N/A) BILATERAL TUBAL LIGATION (Bilateral)  Patient Location: PACU  Anesthesia Type: Spinal  Level of Consciousness: awake  Airway & Oxygen Therapy: Patient Spontanous Breathing  Post-op Assessment: Post -op Vital signs reviewed and stable  Post vital signs: Reviewed  Complications: No apparent anesthesia complications

## 2012-04-09 LAB — CBC
Hemoglobin: 7.9 g/dL — ABNORMAL LOW (ref 12.0–15.0)
RBC: 2.84 MIL/uL — ABNORMAL LOW (ref 3.87–5.11)
WBC: 10.7 10*3/uL — ABNORMAL HIGH (ref 4.0–10.5)

## 2012-04-09 LAB — PROTIME-INR: Prothrombin Time: 12.9 seconds (ref 11.6–15.2)

## 2012-04-09 MED ORDER — METHADONE HCL 10 MG PO TABS
100.0000 mg | ORAL_TABLET | Freq: Every day | ORAL | Status: DC
  Administered 2012-04-10 – 2012-04-11 (×2): 100 mg via ORAL
  Filled 2012-04-09 (×2): qty 10

## 2012-04-09 MED ORDER — WARFARIN - PHARMACIST DOSING INPATIENT: Freq: Every day | Status: DC

## 2012-04-09 MED ORDER — WARFARIN SODIUM 7.5 MG PO TABS
7.5000 mg | ORAL_TABLET | Freq: Once | ORAL | Status: AC
  Administered 2012-04-09: 7.5 mg via ORAL
  Filled 2012-04-09: qty 1

## 2012-04-09 NOTE — Progress Notes (Signed)
Mom to nicu to see baby   resp better since neb. pca discontinued till pts return

## 2012-04-09 NOTE — Progress Notes (Signed)
MOB has significant history with DSS.  Attempted to met with MOB, will try again later today or tomorrow and Weekday CSW will contact DSS on Monday and make report/consult about plan of care as MOB does not have custody of any of her other children, along with MOB tested positive for marijuana only two weeks ago during pregnancy.  CSW continues to follow.  DO NOT DISCHARGE until d/c plans have been confirmed with DSS on Monday 4/15.

## 2012-04-09 NOTE — Progress Notes (Signed)
Called to room for breathing treatment for shortness of breath. No wheezing noted. Good aeration noted throughout. Pt complained of lots of pain stating it was due to her C-section and stated that was probably why she was short of breath. After treatment needed to cough and used splinting but did not want to cough too hard because of pain. Pt wearing the "cannula" for the CO2 monitor for the PCA pump and cannula is room air. Leighton Parody, RRT.

## 2012-04-10 MED ORDER — WARFARIN SODIUM 5 MG PO TABS
5.0000 mg | ORAL_TABLET | Freq: Every day | ORAL | Status: DC
  Administered 2012-04-10: 5 mg via ORAL
  Filled 2012-04-10: qty 1

## 2012-04-10 MED ORDER — WARFARIN VIDEO
Freq: Once | Status: AC
  Administered 2012-04-11: 10:00:00

## 2012-04-10 NOTE — Progress Notes (Addendum)
Post Partum Day 1 Subjective: no complaints, tolerating PO, + flatus and foley removed this morning.  Objective: Blood pressure 124/78, pulse 76, temperature 97.7 F (36.5 C), temperature source Oral, resp. rate 18, height 5\' 7"  (1.702 m), weight 92.67 kg (204 lb 4.8 oz), SpO2 93.00%, unknown if currently breastfeeding.  Physical Exam:  General: alert, cooperative and appears older than stated age CVS:  RRR, without murmur, gallops, or rubs Lungs:  CTA bilat ABD:  +BSx4, hypoactive Lochia: appropriate Uterine Fundus: firm Incision: dressing, clean dry and intact DVT Evaluation: No evidence of DVT seen on physical exam. Negative Homan's sign.   Basename 04/09/12 0546 04/08/12 0550  HGB 7.9* 8.8*  HCT 25.0* 27.7*    Assessment/Plan: POD#1 Hx of Pulmonary Embolism  Continue postpartum care. Restart Lovenox tonight; Begin coumadin per pharmacy    LOS: 5 days   [facility] 04/10/2012, 7:51 AM

## 2012-04-10 NOTE — Progress Notes (Signed)
Subjective: Postpartum Day 2: Cesarean Delivery Patient reports incisional pain, tolerating PO and + flatus.    Objective: Vital signs in last 24 hours: Temp:  [97.7 F (36.5 C)-98.7 F (37.1 C)] 97.7 F (36.5 C) (04/14 0610) Pulse Rate:  [66-82] 76  (04/14 0610) Resp:  [16-18] 18  (04/14 0610) BP: (124-144)/(78-90) 124/78 mmHg (04/14 0610) SpO2:  [93 %-94 %] 93 % (04/13 1152)  Physical Exam:  General: alert, cooperative, appears stated age and no distress Lochia: appropriate Uterine Fundus: firm Incision: healing well, no significant drainage, no dehiscence, no significant erythema DVT Evaluation: No evidence of DVT seen on physical exam. Negative Homan's sign. No cords or calf tenderness. No significant calf/ankle edema.   Basename 04/09/12 0546 04/08/12 0550  HGB 7.9* 8.8*  HCT 25.0* 27.7*    Assessment/Plan: Status post Cesarean section. Doing well postoperatively.  Continue current care.  [name] 04/10/2012, 8:52 AM

## 2012-04-10 NOTE — Progress Notes (Signed)
ANTICOAGULATION CONSULT NOTE - Follow Up Consult  Pharmacy Consult for Warfarin Indication: History of PE / VTE Prophylaxis  No Known Allergies  Patient Measurements: Height: 5\' 7"  (170.2 cm) Weight: 204 lb 4.8 oz (92.67 kg) IBW/kg (Calculated) : 61.6    Vital Signs: Temp: 97.7 F (36.5 C) (04/14 0610) Temp src: Oral (04/14 0610) BP: 124/78 mmHg (04/14 0610) Pulse Rate: 76  (04/14 0610)  Labs:  Basename 04/09/12 2230 04/09/12 0546 04/08/12 0550  HGB -- 7.9* 8.8*  HCT -- 25.0* 27.7*  PLT -- 234 253  APTT -- -- --  LABPROT 12.9 -- --  INR 0.95 -- --  HEPARINUNFRC -- -- --  CREATININE -- -- --  CKTOTAL -- -- --  CKMB -- -- --  TROPONINI -- -- --   Estimated Creatinine Clearance: 128.9 ml/min (by C-G formula based on Cr of 0.64).   Medications:  Lovenox 90 mg SQ Q12hr Warfarin 7.5 mg given last pm X1  Assessment: Baseline INR = 0.95.  Warfarin initiated late last pm.  Goal of Therapy:  INR 2-3   Plan:  Begin warfarin 5 mg daily this pm.   Check PT/INR 4/15 am. Continue enoxaparin until INR therapeutic.  Natasha Bence 04/10/2012,9:07 AM

## 2012-04-10 NOTE — Consult Note (Signed)
ANTICOAGULATION CONSULT NOTE - Initial Consult  Pharmacy Consult for Coumadin  Indication: Hx of PE/ VTE prophylaxis  No Known Allergies  Patient Measurements: Height: 5\' 7"  (170.2 cm) Weight: 204 lb 4.8 oz (92.67 kg) IBW/kg (Calculated) : 61.6    Vital Signs: Temp: 97.9 F (36.6 C) (04/13 2049) Temp src: Oral (04/13 2049) BP: 134/84 mmHg (04/13 2049) Pulse Rate: 82  (04/13 2049)  Labs:  Basename 04/09/12 2230 04/09/12 0546 04/08/12 0550  HGB -- 7.9* 8.8*  HCT -- 25.0* 27.7*  PLT -- 234 253  APTT -- -- --  LABPROT 12.9 -- --  INR 0.95 -- --  HEPARINUNFRC -- -- --  CREATININE -- -- --  CKTOTAL -- -- --  CKMB -- -- --  TROPONINI -- -- --   Estimated Creatinine Clearance: 128.9 ml/min (by C-G formula based on Cr of 0.64).  Medical History: Past Medical History  Diagnosis Date  . Pulmonary embolism 06/2010    Multiple bilateral pulmonary emboli diagnosed by CT scan in in 06/2010.  Moderate size pleural effusion and right lower lobe infarction present at that time.  Lupus anticoagulant was present, but all other aspects of her hypercoagulable evaluation were negative including all Cardiolite and antibodies.    . Anticoagulant long-term use   . Lupus anticoagulant disorder     Previously followed by Dr. Anastasia Fiedler of discharged from his care due to medical noncompliance.  Marland Kitchen HIV positive     Western Blot negative  . Tobacco abuse   . Anemia   . Substance abuse     cocaine, opiates, marijuana; alcohol; most recent positive test for cocaine was in December of 2011; alcohol levels have been as high as 302  . Anxiety and depression     suicide attempt-age 67  . Attention deficit hyperactivity disorder   . Asthma     ABG in 11/2010:7.45, 34, 79., ventolin rescue inhaler last use 03/21/12  . Anxiety   . Depression   . Panic attacks     not on meds  . Bipolar disorder     not on meds  . Abnormal Pap smear   . GERD (gastroesophageal reflux disease)   . Recurrent  upper respiratory infection (URI)   . Cough productive of purulent sputum   . Edema in pregnancy   . Shortness of breath     has asthma- scheduled for PFTs on 04/06/12  . Dermatitis due to detergents     Medications:    Assessment:   Pt is a 20 YO G3P3 S/P C-Section 04/08/12 maintained on lovenox during her pregnancy due to a Hx of PE and with lupus anticoagulant disorder. She will be maintained on bridge therapy with lovenox until her INR is stable.  Goal of Therapy:  INR 2-3     Plan:  7.5 mg coumadin x 1 dose 04/10/11 Daily PT/INR until stable Duration of lovenox per MD CBC per protocol   Arelia Sneddon 04/10/2012,3:51 AM

## 2012-04-10 NOTE — Progress Notes (Signed)
Post Partum Day 2 Subjective: no complaints, up ad lib, voiding, tolerating PO, + flatus and BMx1  Objective: Blood pressure 124/78, pulse 76, temperature 97.7 F (36.5 C), temperature source Oral, resp. rate 18, height 5\' 7"  (1.702 m), weight 92.67 kg (204 lb 4.8 oz), SpO2 93.00%, unknown if currently breastfeeding.  Physical Exam:  General: alert, cooperative, appears stated age and no distress Lochia: appropriate Uterine Fundus: firm Incision: healing well, no significant drainage, no dehiscence, no significant erythema DVT Evaluation: No evidence of DVT seen on physical exam. Calf/Ankle edema is present.   Basename 04/09/12 0546 04/08/12 0550  HGB 7.9* 8.8*  HCT 25.0* 27.7*    Assessment/Plan: Plan for discharge tomorrow, Breastfeeding and Social Work consult. CSW to meet w/ pt tomorrow prior to DC.    LOS: 5 days   MERRELL, DAVID 04/10/2012, 7:54 AM

## 2012-04-10 NOTE — Progress Notes (Signed)
Clinical Social Work Department PSYCHOSOCIAL ASSESSMENT - MATERNAL/CHILD 04/10/2012  Patient:  Dorminey,Ted M  Account Number:  [id number]  Admit Date:  04/05/2012  Childs Name:    Clinical Social Worker:  Kashish Yglesias, LCSW   Date/Time:  04/10/2012 01:00 PM  Date Referred:  04/09/2012   Referral source  Physician     Referred reason  Substance Abuse   Other referral source:    I:  FAMILY / HOME ENVIRONMENT Child's legal guardian:  PARENT  Guardian - Name Guardian - Age Guardian - Address  Jaquitta Varnadore [address] [place] [address], [place], [place] [address]   Other household support members/support persons Name Relationship DOB  Jackie Nyman MOTHER    Other support:   Mary Beth Lysaght, Sister    II  PSYCHOSOCIAL DATA Information Source:  Patient Interview  Financial and Community Resources Employment:   Financial resources:  Medicaid If Medicaid - County:  [place]  School / Grade:   Maternity Care Coordinator / Child Services Coordination / Early Interventions:  Cultural issues impacting care:    III  STRENGTHS Strengths  Adequate Resources  Home prepared for Child (including basic supplies)  Supportive family/friends   Strength comment:    IV  RISK FACTORS AND CURRENT PROBLEMS Current Problem:  YES   Risk Factor & Current Problem Patient Issue Family Issue Risk Factor / Current Problem Comment  Substance Abuse Y N drug use during pregnancy    V  SOCIAL WORK ASSESSMENT CSW spoke to MOB.  MOB reports starting methadone treatment 3-4 weeks ago throught facility here in [place].  She confirms hx with drug use and DSS involvement.  Discussed positive screen in march for marijuana and need for SW to make DSS report.  MOB was tearful yet understanding.  RN reports MOB has been appropriate with infant and reported no concerns.  MOB expressed having enough family support and enought supplies for the infant.  Will contact DSS tomorrow to make report and continue to  follow until DSS confirms D/C plan.      VI SOCIAL WORK PLAN Social Work Plan  Psychosocial Support/Ongoing Assessment of Needs   Type of pt/family education:   If child protective services report - county:   If child protective services report - date:   Information/referral to community resources comment:   Other social work plan:    

## 2012-04-11 ENCOUNTER — Encounter (HOSPITAL_COMMUNITY): Payer: Self-pay | Admitting: Family Medicine

## 2012-04-11 LAB — PROTIME-INR: INR: 1.04 (ref 0.00–1.49)

## 2012-04-11 MED ORDER — OXYCODONE-ACETAMINOPHEN 5-325 MG PO TABS: 1.0000 | ORAL_TABLET | ORAL | Status: AC | PRN

## 2012-04-11 MED ORDER — WARFARIN SODIUM 5 MG PO TABS: 7.5000 mg | ORAL_TABLET | Freq: Every day | ORAL | Status: DC

## 2012-04-11 MED ORDER — IBUPROFEN 600 MG PO TABS: 600.0000 mg | ORAL_TABLET | Freq: Four times a day (QID) | ORAL | Status: DC | PRN

## 2012-04-11 MED ORDER — WARFARIN SODIUM 7.5 MG PO TABS
7.5000 mg | ORAL_TABLET | Freq: Once | ORAL | Status: AC
  Administered 2012-04-11: 7.5 mg via ORAL
  Filled 2012-04-11: qty 1

## 2012-04-11 MED ORDER — SENNOSIDES-DOCUSATE SODIUM 8.6-50 MG PO TABS: 2.0000 | ORAL_TABLET | Freq: Every day | ORAL | Status: DC

## 2012-04-11 NOTE — Progress Notes (Signed)
UR chart review completed.  

## 2012-04-11 NOTE — Discharge Summary (Signed)
Agree with above note.  [name] 04/11/2012 1:37 PM

## 2012-04-11 NOTE — Progress Notes (Deleted)
UR chart review completed.  

## 2012-04-11 NOTE — Progress Notes (Signed)
SW met with MOB in her first floor room/130 to introduce myself and follow up on weekend SW's initial assessment.  MOB was very pleasant and open with SW.  SW thanked MOB for this.  She was tearful at times and told SW that she has changed and is trying to better her life and expressed her deep desire to parent this baby.  She states that she lives with her sister, Montez Hageman boyfriend, and their 2 children (3 and 1).  MOB reports having all necessary supplies for infant at home.  She states she has been in Methadone tx at [facility] for approximately a month and has a good relationship with her counselor, Loraine Leriche.  SW asked about her hx of Anx/Dep and whether she has ever taken medication for it in the past.  She states she has not, and typically only experiences these symptoms when she gets bad news.  SW asked if she would be interested in seeing a psychiatrist to discuss this and she said yes.  SW attempted to make her an appointment at [facility], since they deal with substance use as well, but they cannot take MOB since she only has pregnancy Medicaid.  According to [facility] Staff, the only doctor who still has IPRS funding is a doctor in [place].  MOB was not interested in going to [facility] at this time and states that she plans to apply for regular Medicaid tomorrow.  SW asked her to stay in touch with SW if she is approved for Medicaid.  MOB states she talks about everything, not just the hx of substance use, with her counselor at [employer].  She informed SW that the baby's father (father of all three of her children) was murdered by his cousin in October and she was there.  She states she has dealt with this in counseling.  SW asked when the last time she used substances.  She states she has not used Cocaine since July 2012.  She reports that her last child was removed from her because he was Cocaine positive.  She states her first child was removed because she was homeless.  FOB's ex  girlfriend adopted her first child and a couple in [place] adopted her second.  Carrie Mew was the CPS worker in [place] for her second child, but MOB states she now lives in [place].  She states that she has a good support system and has gotten away from the bad influences in her life.  MOB states she last used Marijuana in Feb/March of 2013.  The most current drug screen we have on file for MOB was 03/21/12, which was positive for Marijuana.  Baby's UDS is negative and the meconium screen is pending at this time.  SW asked about her previous Opiate addiction and reason to start Methadone.  She states that she was taking Percocet due to pain from pregnancy.  MOB's records show PNC started around 28 weeks with this baby.  SW informed MOB that due to extensive drug history and other children being removed from her custody, SW is obligated to make a report to CPS.  She begged SW not to take her baby, and SW explained that this SW does not work for [employer] and does not have the authority to make the decision one way or the other.  SW explained that this SW can also not predict what DSS will do with the case.  She was very understanding of why CPS needed  to be involved and hopes that her positive changes will count in her favor.  SW commended her for the positive steps she is making.  MOB states issues with transportation.  SW and MOB came up with a plan for MOB to be able to visit with baby daily.  Her sister will drop her off at the hospital after they dose at [facility] and then she will take the bus to the nearest stop to their home and her sister will get her there.  SW gave 1 gas card and 4 bus passes at this time and will re-evaluate the situation in a couple days.  MOB states that her sister should be okay with this plan, but is just unable to come get her at the hospital every afternoon.  SW gave MOB the phone number to [employer] and also told her she could use the computer in  the [employer] in order to learn about taking the bus.  MOB was very Adult nurse.  SW made report to [facility] CPS.  Randy Johnson/CPS intake took the report.  MOB can discharge since baby is in the NICU at this time.

## 2012-04-11 NOTE — Discharge Summary (Signed)
Obstetric Discharge Summary Reason for Admission: cesarean section Prenatal Procedures: none Intrapartum Procedures: cesarean: low cervical, transverse and tubal ligation Postpartum Procedures: none Complications-Operative and Postpartum: none Hemoglobin  Date Value Range Status  04/09/2012 7.9* 12.0-15.0 (g/dL) Final     HCT  Date Value Range Status  04/09/2012 25.0* 36.0-46.0 (%) Final    Physical Exam:  General: alert, cooperative, appears stated age and no distress Lochia: appropriate Uterine Fundus: firm Incision: healing well DVT Evaluation: No evidence of DVT seen on physical exam. Negative Homan's sign. No significant calf/ankle edema.  Discharge Diagnoses: Term Pregnancy-delivered  Discharge Information: Date: 04/11/2012 Activity: pelvic rest Diet: routine Medications: Ibuprofen and Percocet Condition: stable Instructions: refer to practice specific booklet Discharge to: home   Newborn Data: Live born female  Birth Weight: 7 lb 2.6 oz (3250 g) APGAR: 8, 9  Home with NICU.  Shara Blazing 04/11/2012, 8:03 AM

## 2012-04-14 ENCOUNTER — Ambulatory Visit: Payer: Medicaid Other

## 2012-04-19 ENCOUNTER — Other Ambulatory Visit: Payer: Self-pay | Admitting: Obstetrics & Gynecology

## 2012-04-20 ENCOUNTER — Ambulatory Visit: Payer: Medicaid Other | Admitting: Family Medicine

## 2012-04-28 ENCOUNTER — Ambulatory Visit: Payer: Medicaid Other

## 2012-05-02 ENCOUNTER — Ambulatory Visit: Payer: Medicaid Other

## 2012-05-03 ENCOUNTER — Ambulatory Visit (INDEPENDENT_AMBULATORY_CARE_PROVIDER_SITE_OTHER): Payer: Medicaid Other | Admitting: *Deleted

## 2012-05-03 DIAGNOSIS — D6861 Antiphospholipid syndrome: Secondary | ICD-10-CM

## 2012-05-03 DIAGNOSIS — I2699 Other pulmonary embolism without acute cor pulmonale: Secondary | ICD-10-CM

## 2012-05-03 DIAGNOSIS — Z7901 Long term (current) use of anticoagulants: Secondary | ICD-10-CM

## 2012-05-03 DIAGNOSIS — D6859 Other primary thrombophilia: Secondary | ICD-10-CM

## 2012-05-03 LAB — POCT INR: INR: 1.4

## 2012-05-06 ENCOUNTER — Ambulatory Visit: Payer: Medicaid Other | Admitting: Family Medicine

## 2012-05-06 ENCOUNTER — Ambulatory Visit (INDEPENDENT_AMBULATORY_CARE_PROVIDER_SITE_OTHER): Payer: Medicaid Other | Admitting: *Deleted

## 2012-05-06 DIAGNOSIS — Z7901 Long term (current) use of anticoagulants: Secondary | ICD-10-CM

## 2012-05-06 DIAGNOSIS — D6861 Antiphospholipid syndrome: Secondary | ICD-10-CM

## 2012-05-06 DIAGNOSIS — D6859 Other primary thrombophilia: Secondary | ICD-10-CM

## 2012-05-06 DIAGNOSIS — I2699 Other pulmonary embolism without acute cor pulmonale: Secondary | ICD-10-CM

## 2012-05-09 ENCOUNTER — Ambulatory Visit (INDEPENDENT_AMBULATORY_CARE_PROVIDER_SITE_OTHER): Payer: Medicaid Other | Admitting: *Deleted

## 2012-05-09 DIAGNOSIS — Z7901 Long term (current) use of anticoagulants: Secondary | ICD-10-CM

## 2012-05-09 DIAGNOSIS — D6859 Other primary thrombophilia: Secondary | ICD-10-CM

## 2012-05-09 DIAGNOSIS — D6861 Antiphospholipid syndrome: Secondary | ICD-10-CM

## 2012-05-09 DIAGNOSIS — I2699 Other pulmonary embolism without acute cor pulmonale: Secondary | ICD-10-CM

## 2012-05-11 ENCOUNTER — Ambulatory Visit (INDEPENDENT_AMBULATORY_CARE_PROVIDER_SITE_OTHER): Payer: Medicaid Other | Admitting: Family Medicine

## 2012-05-11 ENCOUNTER — Encounter: Payer: Self-pay | Admitting: Family Medicine

## 2012-05-11 ENCOUNTER — Ambulatory Visit (INDEPENDENT_AMBULATORY_CARE_PROVIDER_SITE_OTHER): Payer: Medicaid Other | Admitting: *Deleted

## 2012-05-11 VITALS — BP 123/80 | HR 101 | Temp 98.3°F | Ht 66.0 in | Wt 168.0 lb

## 2012-05-11 DIAGNOSIS — D6861 Antiphospholipid syndrome: Secondary | ICD-10-CM

## 2012-05-11 DIAGNOSIS — D6859 Other primary thrombophilia: Secondary | ICD-10-CM

## 2012-05-11 DIAGNOSIS — Z7901 Long term (current) use of anticoagulants: Secondary | ICD-10-CM

## 2012-05-11 DIAGNOSIS — F329 Major depressive disorder, single episode, unspecified: Secondary | ICD-10-CM

## 2012-05-11 DIAGNOSIS — R1013 Epigastric pain: Secondary | ICD-10-CM

## 2012-05-11 DIAGNOSIS — F3289 Other specified depressive episodes: Secondary | ICD-10-CM

## 2012-05-11 DIAGNOSIS — I2699 Other pulmonary embolism without acute cor pulmonale: Secondary | ICD-10-CM

## 2012-05-11 DIAGNOSIS — J45909 Unspecified asthma, uncomplicated: Secondary | ICD-10-CM

## 2012-05-11 DIAGNOSIS — F17200 Nicotine dependence, unspecified, uncomplicated: Secondary | ICD-10-CM

## 2012-05-11 LAB — POCT H PYLORI SCREEN: H Pylori Screen, POC: NEGATIVE

## 2012-05-11 NOTE — Progress Notes (Signed)
I examined pt and agree with documentation above and resident plan of care. MUHAMMAD,Zai Chmiel  

## 2012-05-11 NOTE — Patient Instructions (Signed)
Thank you for coming into clinic today. It was a pleasure meeting you. I will call you with the results of your test today and let you know if we need to provide you with any additional medications. I will also review your breathing treatment regimen and let you know what changes we can make that will be covered by medicaid. I will call you and let you know what time to come back to see me. Feel free to call and make an appointment any time. Have a great day.

## 2012-05-15 ENCOUNTER — Encounter: Payer: Self-pay | Admitting: Family Medicine

## 2012-05-15 MED ORDER — ESOMEPRAZOLE MAGNESIUM 40 MG PO CPDR: 40.0000 mg | DELAYED_RELEASE_CAPSULE | Freq: Every day | ORAL | Status: DC

## 2012-05-15 MED ORDER — FLUTICASONE-SALMETEROL 250-50 MCG/DOSE IN AEPB: 1.0000 | INHALATION_SPRAY | Freq: Two times a day (BID) | RESPIRATORY_TRACT | Status: DC

## 2012-05-15 NOTE — Assessment & Plan Note (Signed)
Not ready to quit at this time. Continues to smoke ~1ppd. Tried patches in past w/o success.

## 2012-05-15 NOTE — Assessment & Plan Note (Addendum)
Continue INR checks to maintaint INR 2-3. Last DVT 42yrs ago.

## 2012-05-15 NOTE — Assessment & Plan Note (Signed)
GERD vs H.Pylori vs viral gastro vs cholelithiasis. H. Pylori antibody today. Nexium for 2 wks. Pt counseled to pay attention to triggers and to avoid foods that exacerbate symptoms. Reasons for return discussed.

## 2012-05-15 NOTE — Assessment & Plan Note (Signed)
No intervention at this time. No Rx.

## 2012-05-15 NOTE — Assessment & Plan Note (Signed)
Not well controlled. Albuterol QHS innapropriate. Will increase Advair to 250-50. Discussed appropriate use of rescue medication and need to let me know if requiring daily albuterol.

## 2012-05-15 NOTE — Assessment & Plan Note (Addendum)
INR check today. Last DVT 53yrs ago. Pt w/ BTL in April after last pregnancy. Continue current anticoagulation therapy

## 2012-05-15 NOTE — Progress Notes (Signed)
  Subjective:    Patient ID: [name], female    DOB: 1991/10/25, 21 y.o.   MRN: [id number]  NEW PATIENT  HPI CC: Stomach pain  Stomach pain that occurs approximately after meals for the past 2 days and is alleviated w/ emesis. Ice cream in particular makes it worse. Achy in nature. Epigastric in location. Non radiating. Denies heart burn, diarrhea, constipation, hematemesis, hematochezia, wt loss. No PMHx of H. Pylori. Has been off Nexium since February  Asthma: Reports nightly use of albuterol inh. Pt reports typically being hospitalized every December around Christmas time w/ exacerbation. Triggers include exercise, animals, seasonal changes. Previously underwent allergy testing and states that she is allergic to everything. Also taking her advair 150/50 BID.   Antiphospholipid antibody syndrome: Multiple previous PEs and DVTs. On coumadin w/ INR in the 2-3 range. Denies SOB, CP, LE swelling or pain.    Review of Systems Per HPI    Objective:   Physical Exam  Gen: WNWD, NAD CV: RRR, no m/r/g Res: CTAB, normal effort Abd: non-painful to palpation, Ext: No edema, non-painful to palpation, 2+ pulses throughout Skin: R wrist w/ healing scar from recent excision. No erythema or edema     Assessment & Plan:

## 2012-05-20 ENCOUNTER — Ambulatory Visit (INDEPENDENT_AMBULATORY_CARE_PROVIDER_SITE_OTHER): Payer: Medicaid Other | Admitting: *Deleted

## 2012-05-20 DIAGNOSIS — D6861 Antiphospholipid syndrome: Secondary | ICD-10-CM

## 2012-05-20 DIAGNOSIS — Z7901 Long term (current) use of anticoagulants: Secondary | ICD-10-CM

## 2012-05-20 DIAGNOSIS — D6859 Other primary thrombophilia: Secondary | ICD-10-CM

## 2012-05-20 DIAGNOSIS — I2699 Other pulmonary embolism without acute cor pulmonale: Secondary | ICD-10-CM

## 2012-05-27 ENCOUNTER — Ambulatory Visit: Payer: Medicaid Other

## 2012-05-30 ENCOUNTER — Ambulatory Visit (INDEPENDENT_AMBULATORY_CARE_PROVIDER_SITE_OTHER): Payer: Medicaid Other | Admitting: *Deleted

## 2012-05-30 DIAGNOSIS — D6861 Antiphospholipid syndrome: Secondary | ICD-10-CM

## 2012-05-30 DIAGNOSIS — I2699 Other pulmonary embolism without acute cor pulmonale: Secondary | ICD-10-CM

## 2012-05-30 DIAGNOSIS — D6859 Other primary thrombophilia: Secondary | ICD-10-CM

## 2012-05-30 DIAGNOSIS — Z7901 Long term (current) use of anticoagulants: Secondary | ICD-10-CM

## 2012-06-03 ENCOUNTER — Ambulatory Visit: Payer: Medicaid Other

## 2012-12-03 IMAGING — US US FETAL BPP W/O NONSTRESS
1 series · 11 of 11 positions shown · non-contrast
Comparison: none

[Series 1: us fetal bpp w/o nonstress · non-contrast · 11 acquisitions, 11 frames shown]
[im 1/11]
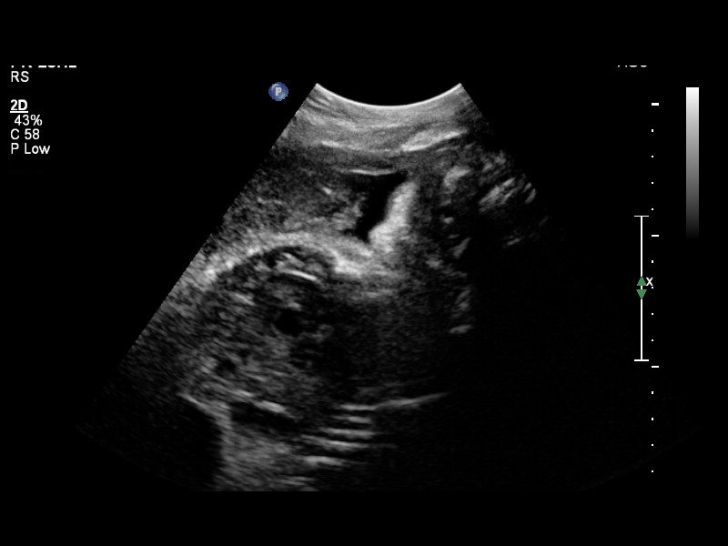
[im 2/11]
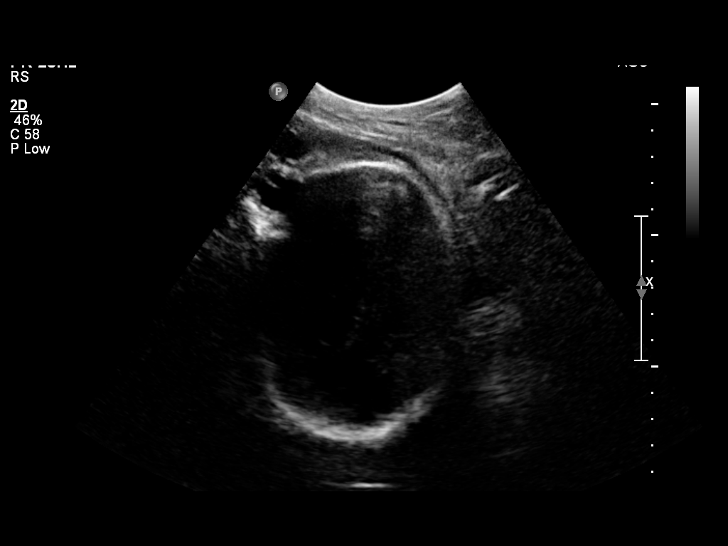
[im 3/11]
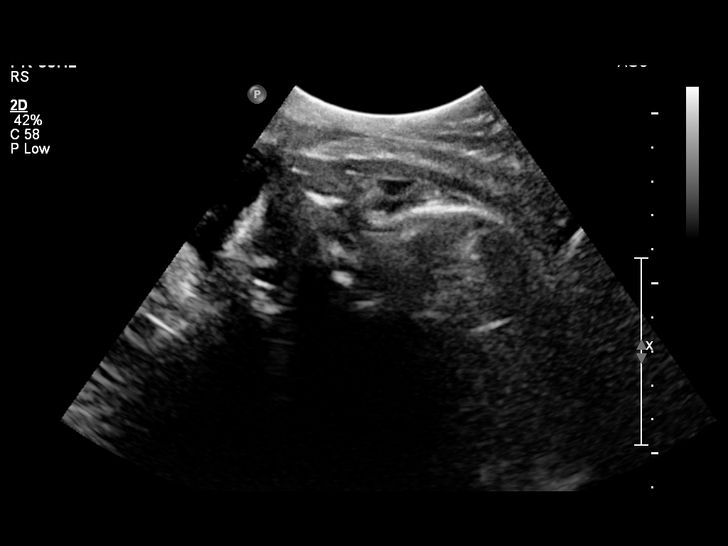
[im 4/11]
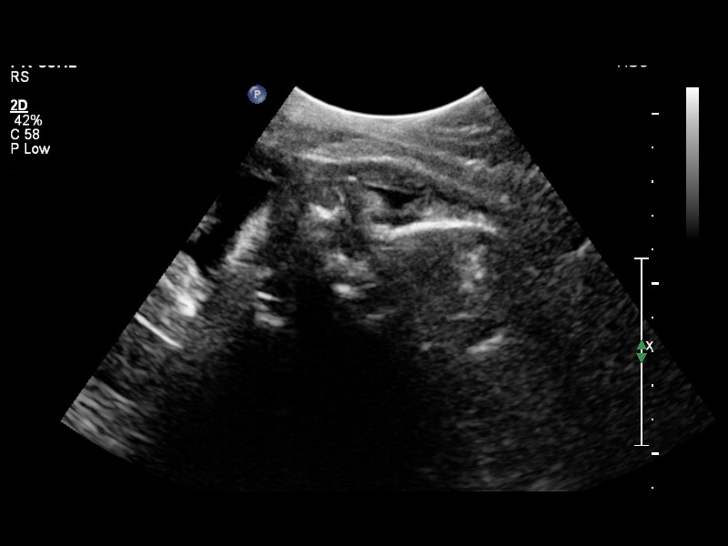
[im 5/11]
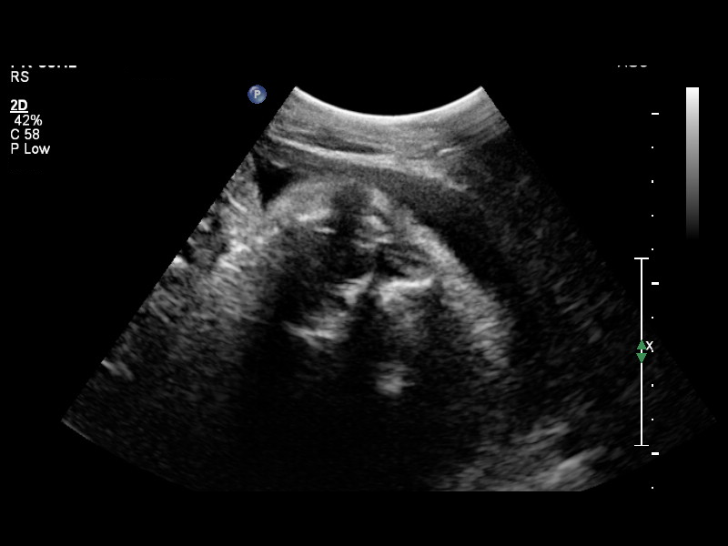
[im 6/11]
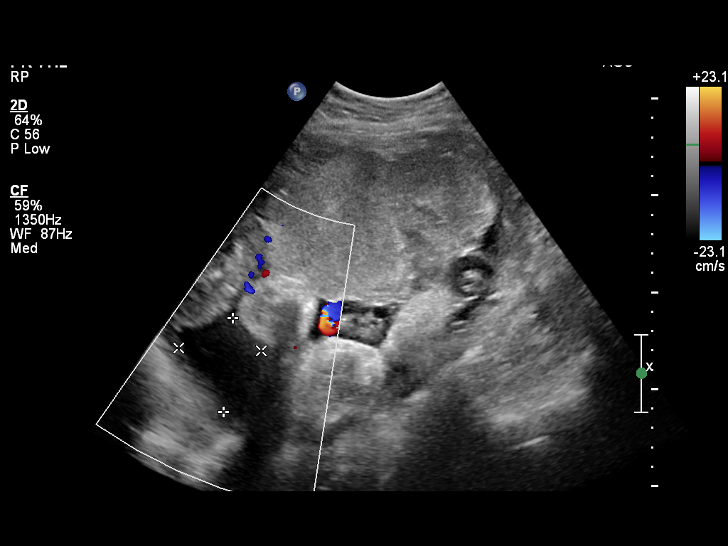
[im 7/11]
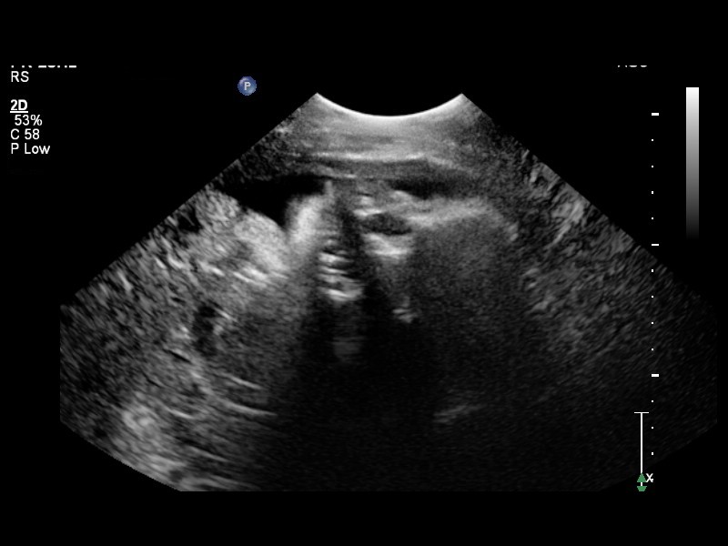
[im 8/11]
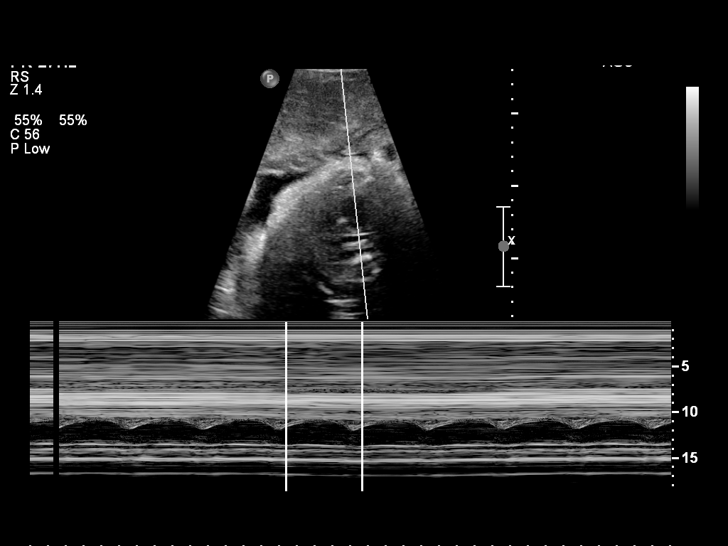
[im 9/11]
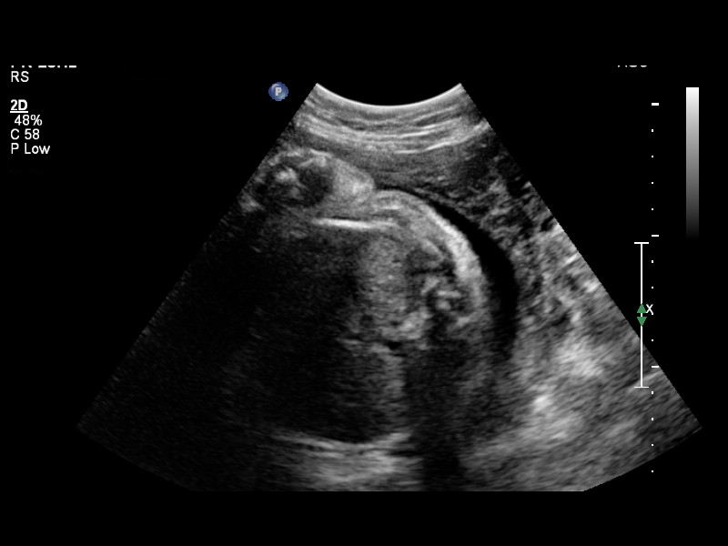
[im 10/11]
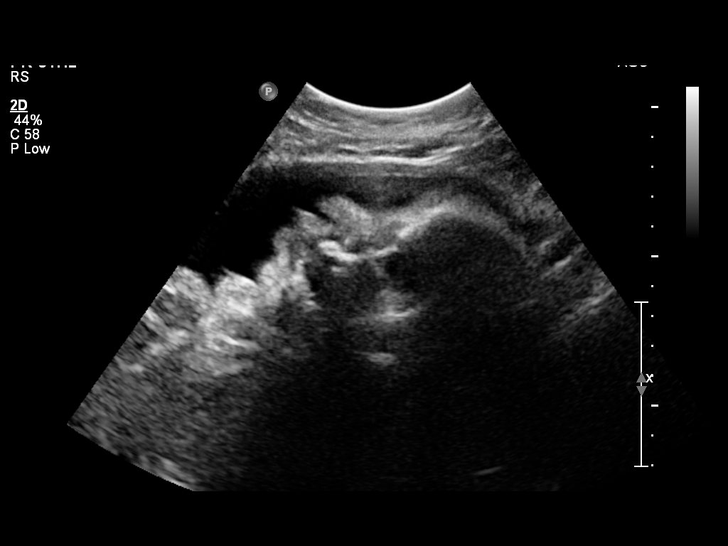
[im 11/11]
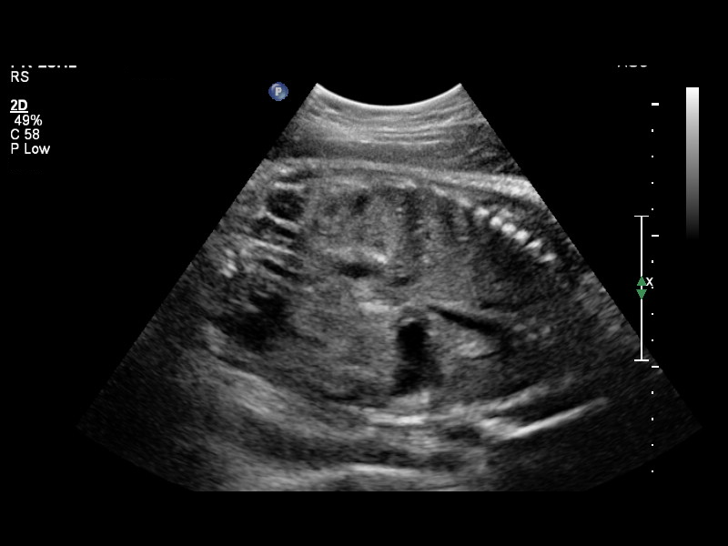

[11 of 11 positions shown; findings below may reference images not displayed]

OBSTETRICS REPORT
                      (Signed Final 04/04/2012 [DATE])

 Order#:         [id number]
Procedures

Indications

 Assess fetal well being
 Non-reactive NST
 Maternal drug exposure
 Previous cesarean section x 2
 No or Little Prenatal Care
 Clotting Disorder (Currently treated with Lovenox)
 Lupus
Fetal Evaluation

 Fetal Heart Rate:  140                         bpm
 Cardiac Activity:  Observed
 Presentation:      Cephalic

 Comment:    BPP [DATE] in 30 minutes.  Breathing noted intermittently,
             but not sustained.

 Amniotic Fluid
 AFI FV:      Subjectively within normal limits
                                             Larg Pckt:   4.84   cm
Biophysical Evaluation

 Amniotic F.V:   Pocket => 2 cm two         F. Tone:        Not Observed
                 planes
 F. Movement:    Observed                   Score:          [DATE]
 F. Breathing:   Observed
Gestational Age

 LMP:           36w 5d       Date:   07/22/11                 EDD:   04/27/12
 Best:          38w 3d    Det. By:   U/S (01/07/12)           EDD:   04/15/12
Impression

 Biophysical profile score of [DATE].
 questions or concerns.

## 2013-07-24 ENCOUNTER — Encounter (HOSPITAL_COMMUNITY): Payer: Self-pay

## 2013-07-24 ENCOUNTER — Emergency Department (HOSPITAL_COMMUNITY)
Admission: EM | Admit: 2013-07-24 | Discharge: 2013-07-24 | Disposition: A | Discharge status: Home / Self Care | Payer: Self-pay | Attending: Emergency Medicine | Admitting: Emergency Medicine

## 2013-07-24 DIAGNOSIS — L258 Unspecified contact dermatitis due to other agents: Secondary | ICD-10-CM | POA: Insufficient documentation

## 2013-07-24 DIAGNOSIS — R51 Headache: Secondary | ICD-10-CM | POA: Insufficient documentation

## 2013-07-24 DIAGNOSIS — R21 Rash and other nonspecific skin eruption: Secondary | ICD-10-CM | POA: Insufficient documentation

## 2013-07-24 DIAGNOSIS — Z8742 Personal history of other diseases of the female genital tract: Secondary | ICD-10-CM | POA: Insufficient documentation

## 2013-07-24 DIAGNOSIS — F17200 Nicotine dependence, unspecified, uncomplicated: Secondary | ICD-10-CM | POA: Insufficient documentation

## 2013-07-24 DIAGNOSIS — Z21 Asymptomatic human immunodeficiency virus [HIV] infection status: Secondary | ICD-10-CM | POA: Insufficient documentation

## 2013-07-24 DIAGNOSIS — Z8659 Personal history of other mental and behavioral disorders: Secondary | ICD-10-CM | POA: Insufficient documentation

## 2013-07-24 DIAGNOSIS — Z862 Personal history of diseases of the blood and blood-forming organs and certain disorders involving the immune mechanism: Secondary | ICD-10-CM | POA: Insufficient documentation

## 2013-07-24 DIAGNOSIS — Z7901 Long term (current) use of anticoagulants: Secondary | ICD-10-CM | POA: Insufficient documentation

## 2013-07-24 DIAGNOSIS — K219 Gastro-esophageal reflux disease without esophagitis: Secondary | ICD-10-CM | POA: Insufficient documentation

## 2013-07-24 DIAGNOSIS — L252 Unspecified contact dermatitis due to dyes: Secondary | ICD-10-CM

## 2013-07-24 DIAGNOSIS — J45909 Unspecified asthma, uncomplicated: Secondary | ICD-10-CM | POA: Insufficient documentation

## 2013-07-24 DIAGNOSIS — Z872 Personal history of diseases of the skin and subcutaneous tissue: Secondary | ICD-10-CM | POA: Insufficient documentation

## 2013-07-24 DIAGNOSIS — Z79899 Other long term (current) drug therapy: Secondary | ICD-10-CM | POA: Insufficient documentation

## 2013-07-24 DIAGNOSIS — Z8709 Personal history of other diseases of the respiratory system: Secondary | ICD-10-CM | POA: Insufficient documentation

## 2013-07-24 DIAGNOSIS — Z8679 Personal history of other diseases of the circulatory system: Secondary | ICD-10-CM | POA: Insufficient documentation

## 2013-07-24 DIAGNOSIS — IMO0002 Reserved for concepts with insufficient information to code with codable children: Secondary | ICD-10-CM | POA: Insufficient documentation

## 2013-07-24 MED ORDER — OXYCODONE-ACETAMINOPHEN 5-325 MG PO TABS
2.0000 | ORAL_TABLET | Freq: Once | ORAL | Status: AC
  Administered 2013-07-24: 2 via ORAL
  Filled 2013-07-24: qty 2

## 2013-07-24 MED ORDER — OXYCODONE-ACETAMINOPHEN 5-325 MG PO TABS: 1.0000 | ORAL_TABLET | ORAL | Status: DC | PRN

## 2013-07-24 MED ORDER — DIPHENHYDRAMINE HCL 25 MG PO CAPS
25.0000 mg | ORAL_CAPSULE | Freq: Once | ORAL | Status: AC
  Administered 2013-07-24: 25 mg via ORAL
  Filled 2013-07-24: qty 1

## 2013-07-24 MED ORDER — PREDNISONE 10 MG PO TABS: 20.0000 mg | ORAL_TABLET | Freq: Every day | ORAL | Status: DC

## 2013-07-24 MED ORDER — PREDNISONE 50 MG PO TABS
60.0000 mg | ORAL_TABLET | Freq: Once | ORAL | Status: AC
  Administered 2013-07-24: 60 mg via ORAL
  Filled 2013-07-24: qty 1
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
## 2013-07-24 NOTE — ED Provider Notes (Signed)
CSN: [id number]     Arrival date & time 07/24/13  1128 History    This chart was scribed for Hilario Quarry, MD, by Yevette Edwards, ED Scribe. This patient was seen in room APFT22/APFT22 and the patient's care was started at 11:53 AM.   First MD Initiated Contact with Patient 07/24/13 1149     Chief Complaint  Patient presents with  . Wound Check    The history is provided by the patient. No language interpreter was used.   HPI Comments: [name] is a 22 y.o. female who presents to the Emergency Department complaining of pain to the back of her head. She reports that she dyed her hair a couple days ago; she had never dyed her hair before. The pain began after dying her hair, and she believed that she was experiencing a chemical burn due to the dye. The pt states she has attempted to treat the pain with benadryl, but with little resolution. She states that the pain has prevented her from sleeping. The pt has a h/o of PE and HIV.  Past Medical History  Diagnosis Date  . Pulmonary embolism 06/2010    Multiple bilateral pulmonary emboli diagnosed by CT scan in in 06/2010.  Moderate size pleural effusion and right lower lobe infarction present at that time.  Lupus anticoagulant was present, but all other aspects of her hypercoagulable evaluation were negative including all Cardiolite and antibodies.    . Anticoagulant long-term use   . Lupus anticoagulant disorder     Previously followed by Dr. Anastasia Fiedler of discharged from his care due to medical noncompliance.  Marland Kitchen HIV positive     Western Blot negative  . Tobacco abuse   . Anemia   . Substance abuse     cocaine, opiates, marijuana; alcohol; most recent positive test for cocaine was in December of 2011; alcohol levels have been as high as 302  . Anxiety and depression     suicide attempt-age 37  . Attention deficit hyperactivity disorder   . Asthma     ABG in 11/2010:7.45, 34, 79., ventolin rescue inhaler last use 03/21/12  .  Anxiety   . Panic attacks     not on meds  . Bipolar disorder     not on meds  . Abnormal Pap smear   . GERD (gastroesophageal reflux disease)   . Recurrent upper respiratory infection (URI)   . Cough productive of purulent sputum   . Edema in pregnancy   . Shortness of breath     has asthma- scheduled for PFTs on 04/06/12  . Dermatitis due to detergents   . Antiphospholipid antibody with hypercoagulable state   . Depression     suicide attempt at age 18   Past Surgical History  Procedure Laterality Date  . Cesarean section  2011, 2009  . Wrist surgery  2009    hemoangioma removed from right wrist  . Cesarean section  04/08/2012    Procedure: CESAREAN SECTION;  Surgeon: Reva Bores, MD;  Location: [facility];  Service: Gynecology;  Laterality: N/A;  . Tubal ligation  04/08/2012    Procedure: BILATERAL TUBAL LIGATION;  Surgeon: Reva Bores, MD;  Location: [facility];  Service: Gynecology;  Laterality: Bilateral;  . Hemangioma excision  2013    R wrist   Family History  Problem Relation Age of Onset  . Drug abuse Mother     History   . Depression Mother   . Drug abuse Father   .  Heart attack Father 36    H/o drug abuse  . Anesthesia problems Neg Hx   . Hypotension Neg Hx   . Malignant hyperthermia Neg Hx   . Pseudochol deficiency Neg Hx   . Depression Sister   . Depression Maternal Grandmother   . Diabetes Maternal Grandfather    History  Substance Use Topics  . Smoking status: Current Every Day Smoker -- 0.50 packs/day    Types: Cigarettes  . Smokeless tobacco: Never Used  . Alcohol Use: No     Comment: former alcohol abuse   OB History   Grav Para Term Preterm Abortions TAB SAB Ect Mult Living   3 3 3  0 0 0 0 0 0 3     Review of Systems  Skin: Positive for rash.  All other systems reviewed and are negative.   Allergies  Review of patient's allergies indicates no known allergies.  Home Medications   Current Outpatient Rx  Name  Route  Sig  Dispense  Refill   . cyclobenzaprine (FLEXERIL) 10 MG tablet      TAKE 1 TABLET BY MOUTH EVERY 8 HOURS AS NEEDED FOR PAIN   30 tablet   2     GWN   . EXPIRED: esomeprazole (NEXIUM) 40 MG capsule   Oral   Take 1 capsule (40 mg total) by mouth daily before breakfast.   14 capsule   0   . Fluticasone-Salmeterol (ADVAIR) 250-50 MCG/DOSE AEPB   Inhalation   Inhale 1 puff into the lungs 2 (two) times daily.   60 each   6   . methadone (DOLOPHINE) 10 MG/ML solution   Oral   Take 100 mg by mouth daily.          Marland Kitchen EXPIRED: promethazine (PHENERGAN) 25 MG tablet   Oral   Take 1 tablet (25 mg total) by mouth every 6 (six) hours as needed for nausea.   30 tablet   1   . promethazine (PHENERGAN) 25 MG tablet   Oral   Take 1 tablet (25 mg total) by mouth every 6 (six) hours as needed. As needed for nausea   30 tablet   2   . EXPIRED: warfarin (COUMADIN) 5 MG tablet   Oral   Take 1.5 tablets (7.5 mg total) by mouth daily at 6 PM.   60 tablet   6    Triage Vitals: BP 107/72  Pulse 88  Temp(Src) 98.2 F (36.8 C) (Oral)  Resp 18  Ht 5\' 7"  (1.702 m)  Wt 170 lb (77.111 kg)  BMI 26.62 kg/m2  SpO2 100%  LMP 06/27/2013  Physical Exam  Nursing note and vitals reviewed. Constitutional: She is oriented to person, place, and time. She appears well-developed and well-nourished. No distress.  HENT:  Head: Normocephalic and atraumatic.  Eyes: EOM are normal.  Neck: Neck supple. No tracheal deviation present.  Cardiovascular: Normal rate.   Pulmonary/Chest: Effort normal. No respiratory distress.  Musculoskeletal: Normal range of motion.  Neurological: She is alert and oriented to person, place, and time.  Skin: Skin is warm and dry. Rash noted.  Macular papular areas along neck and around ears. Consistent with an allergic reaction.   Psychiatric: She has a normal mood and affect. Her behavior is normal.    ED Course   DIAGNOSTIC STUDIES: Oxygen Saturation is 100% on room air, normal by my  interpretation.    COORDINATION OF CARE:  11:55 AM- Discussed treatment plan with patient, and the patient agreed  to the plan.   Procedures (including critical care time)  Labs Reviewed - No data to display No results found. No diagnosis found.  MDM  Contact dermatitis from hair dye  I personally performed the services described in this documentation, which was scribed in my presence. The recorded information has been reviewed and considered.   Hilario Quarry, MD 07/25/13 [phone]

## 2013-08-21 ENCOUNTER — Encounter (HOSPITAL_COMMUNITY): Payer: Self-pay | Admitting: *Deleted

## 2013-08-21 ENCOUNTER — Emergency Department (HOSPITAL_COMMUNITY)
Admission: EM | Admit: 2013-08-21 | Discharge: 2013-08-21 | Disposition: A | Discharge status: Home / Self Care | Payer: Self-pay | Attending: Emergency Medicine | Admitting: Emergency Medicine

## 2013-08-21 DIAGNOSIS — Z8719 Personal history of other diseases of the digestive system: Secondary | ICD-10-CM | POA: Insufficient documentation

## 2013-08-21 DIAGNOSIS — L253 Unspecified contact dermatitis due to other chemical products: Secondary | ICD-10-CM

## 2013-08-21 DIAGNOSIS — Z21 Asymptomatic human immunodeficiency virus [HIV] infection status: Secondary | ICD-10-CM | POA: Insufficient documentation

## 2013-08-21 DIAGNOSIS — Z8659 Personal history of other mental and behavioral disorders: Secondary | ICD-10-CM | POA: Insufficient documentation

## 2013-08-21 DIAGNOSIS — F17200 Nicotine dependence, unspecified, uncomplicated: Secondary | ICD-10-CM | POA: Insufficient documentation

## 2013-08-21 DIAGNOSIS — Z79899 Other long term (current) drug therapy: Secondary | ICD-10-CM | POA: Insufficient documentation

## 2013-08-21 DIAGNOSIS — L299 Pruritus, unspecified: Secondary | ICD-10-CM | POA: Insufficient documentation

## 2013-08-21 DIAGNOSIS — Z86711 Personal history of pulmonary embolism: Secondary | ICD-10-CM | POA: Insufficient documentation

## 2013-08-21 DIAGNOSIS — Z862 Personal history of diseases of the blood and blood-forming organs and certain disorders involving the immune mechanism: Secondary | ICD-10-CM | POA: Insufficient documentation

## 2013-08-21 DIAGNOSIS — F329 Major depressive disorder, single episode, unspecified: Secondary | ICD-10-CM | POA: Insufficient documentation

## 2013-08-21 DIAGNOSIS — L02519 Cutaneous abscess of unspecified hand: Secondary | ICD-10-CM | POA: Insufficient documentation

## 2013-08-21 DIAGNOSIS — Z872 Personal history of diseases of the skin and subcutaneous tissue: Secondary | ICD-10-CM | POA: Insufficient documentation

## 2013-08-21 DIAGNOSIS — L02511 Cutaneous abscess of right hand: Secondary | ICD-10-CM

## 2013-08-21 DIAGNOSIS — J45909 Unspecified asthma, uncomplicated: Secondary | ICD-10-CM | POA: Insufficient documentation

## 2013-08-21 DIAGNOSIS — L03019 Cellulitis of unspecified finger: Secondary | ICD-10-CM | POA: Insufficient documentation

## 2013-08-21 DIAGNOSIS — Z8709 Personal history of other diseases of the respiratory system: Secondary | ICD-10-CM | POA: Insufficient documentation

## 2013-08-21 DIAGNOSIS — Z7901 Long term (current) use of anticoagulants: Secondary | ICD-10-CM | POA: Insufficient documentation

## 2013-08-21 DIAGNOSIS — F411 Generalized anxiety disorder: Secondary | ICD-10-CM | POA: Insufficient documentation

## 2013-08-21 DIAGNOSIS — F3289 Other specified depressive episodes: Secondary | ICD-10-CM | POA: Insufficient documentation

## 2013-08-21 MED ORDER — SULFAMETHOXAZOLE-TRIMETHOPRIM 800-160 MG PO TABS: 1.0000 | ORAL_TABLET | Freq: Two times a day (BID) | ORAL | Status: AC

## 2013-08-21 MED ORDER — LIDOCAINE HCL (PF) 2 % IJ SOLN
INTRAMUSCULAR | Status: AC
  Filled 2013-08-21: qty 10

## 2013-08-21 MED ORDER — TRIAMCINOLONE ACETONIDE 0.1 % EX CREA: TOPICAL_CREAM | Freq: Two times a day (BID) | CUTANEOUS | Status: DC

## 2013-08-21 MED ORDER — TRAMADOL HCL 50 MG PO TABS: 50.0000 mg | ORAL_TABLET | Freq: Four times a day (QID) | ORAL | Status: DC | PRN

## 2013-08-21 MED ORDER — LIDOCAINE HCL (PF) 2 % IJ SOLN: 2.0000 mL | Freq: Once | INTRAMUSCULAR | Status: DC

## 2013-08-21 MED ORDER — HYDROCODONE-ACETAMINOPHEN 5-325 MG PO TABS: 1.0000 | ORAL_TABLET | ORAL | Status: DC | PRN

## 2013-08-21 NOTE — ED Notes (Signed)
Pt had rash to neck after using dye to hair, Has skin eruptions to post . Neck and  Rt thumb.  Says it hurts.

## 2013-08-26 NOTE — ED Provider Notes (Signed)
CSN: [id number]     Arrival date & time 08/21/13  1335 History   First MD Initiated Contact with Patient 08/21/13 1412     Chief Complaint  Patient presents with  . Rash   (Consider location/radiation/quality/duration/timing/severity/associated sxs/prior Treatment) HPI Comments: [name] is a 22 y.o. Female presenting with a rash on her posterior neck which has been present for the past month which she blames on a hair dye product.  She describes itching but also has some tender areas which she states is probably from scratching too much.  There has been no drainage from the lesions.  She was treated with a prednisone taper for this last month which temporarily improved the area, but never completely resolved.  She also describes puncture wound which has developed into a painful knot on her right thumb which has been present for the past several days.  She describes a pustule which she squeezed, with a small amount of pus extracted, but still with persistent pain and swelling.  She denies fevers, chills, denies any other symptoms.     The history is provided by the patient.    Past Medical History  Diagnosis Date  . Pulmonary embolism 06/2010    Multiple bilateral pulmonary emboli diagnosed by CT scan in in 06/2010.  Moderate size pleural effusion and right lower lobe infarction present at that time.  Lupus anticoagulant was present, but all other aspects of her hypercoagulable evaluation were negative including all Cardiolite and antibodies.    . Anticoagulant long-term use   . Lupus anticoagulant disorder     Previously followed by Dr. Anastasia Fiedler of discharged from his care due to medical noncompliance.  Marland Kitchen HIV positive     Western Blot negative  . Tobacco abuse   . Anemia   . Substance abuse     cocaine, opiates, marijuana; alcohol; most recent positive test for cocaine was in December of 2011; alcohol levels have been as high as 302  . Anxiety and depression     suicide  attempt-age 49  . Attention deficit hyperactivity disorder   . Asthma     ABG in 11/2010:7.45, 34, 79., ventolin rescue inhaler last use 03/21/12  . Anxiety   . Panic attacks     not on meds  . Bipolar disorder     not on meds  . Abnormal Pap smear   . GERD (gastroesophageal reflux disease)   . Recurrent upper respiratory infection (URI)   . Cough productive of purulent sputum   . Edema in pregnancy   . Shortness of breath     has asthma- scheduled for PFTs on 04/06/12  . Dermatitis due to detergents   . Antiphospholipid antibody with hypercoagulable state   . Depression     suicide attempt at age 13   Past Surgical History  Procedure Laterality Date  . Cesarean section  2011, 2009  . Wrist surgery  2009    hemoangioma removed from right wrist  . Cesarean section  04/08/2012    Procedure: CESAREAN SECTION;  Surgeon: Reva Bores, MD;  Location: [facility];  Service: Gynecology;  Laterality: N/A;  . Tubal ligation  04/08/2012    Procedure: BILATERAL TUBAL LIGATION;  Surgeon: Reva Bores, MD;  Location: [facility];  Service: Gynecology;  Laterality: Bilateral;  . Hemangioma excision  2013    R wrist   Family History  Problem Relation Age of Onset  . Drug abuse Mother     History   .  Depression Mother   . Drug abuse Father   . Heart attack Father 65    H/o drug abuse  . Anesthesia problems Neg Hx   . Hypotension Neg Hx   . Malignant hyperthermia Neg Hx   . Pseudochol deficiency Neg Hx   . Depression Sister   . Depression Maternal Grandmother   . Diabetes Maternal Grandfather    History  Substance Use Topics  . Smoking status: Current Every Day Smoker -- 0.50 packs/day    Types: Cigarettes  . Smokeless tobacco: Never Used  . Alcohol Use: No     Comment: former alcohol abuse   OB History   Grav Para Term Preterm Abortions TAB SAB Ect Mult Living   3 3 3  0 0 0 0 0 0 3     Review of Systems  Constitutional: Negative for fever.  HENT: Negative for congestion, sore  throat and neck pain.   Eyes: Negative.   Respiratory: Negative for chest tightness and shortness of breath.   Cardiovascular: Negative for chest pain.  Gastrointestinal: Negative for nausea and abdominal pain.  Genitourinary: Negative.   Musculoskeletal: Negative for joint swelling and arthralgias.  Skin: Positive for rash and wound.  Neurological: Negative for dizziness, weakness, light-headedness, numbness and headaches.  Psychiatric/Behavioral: Negative.     Allergies  Review of patient's allergies indicates no known allergies.  Home Medications   Current Outpatient Rx  Name  Route  Sig  Dispense  Refill  . albuterol (PROVENTIL) (2.5 MG/3ML) 0.083% nebulizer solution   Nebulization   Take 2.5 mg by nebulization every 6 (six) hours as needed for wheezing.         . Aspirin-Salicylamide-Caffeine (BC HEADACHE POWDER PO)   Oral   Take 1 packet by mouth daily as needed (pain/headache).         . oxyCODONE-acetaminophen (PERCOCET/ROXICET) 5-325 MG per tablet   Oral   Take 1 tablet by mouth every 4 (four) hours as needed for pain.   6 tablet   0   . predniSONE (DELTASONE) 10 MG tablet   Oral   Take 2 tablets (20 mg total) by mouth daily.   15 tablet   0   . HYDROcodone-acetaminophen (NORCO/VICODIN) 5-325 MG per tablet   Oral   Take 1 tablet by mouth every 4 (four) hours as needed for pain.   10 tablet   0   . sulfamethoxazole-trimethoprim (BACTRIM DS,SEPTRA DS) 800-160 MG per tablet   Oral   Take 1 tablet by mouth 2 (two) times daily.   14 tablet   0   . traMADol (ULTRAM) 50 MG tablet   Oral   Take 1 tablet (50 mg total) by mouth every 6 (six) hours as needed for pain.   15 tablet   0   . triamcinolone cream (KENALOG) 0.1 %   Topical   Apply topically 2 (two) times daily.   30 g   0    BP 109/59  Pulse 83  Temp(Src) 97.4 F (36.3 C) (Oral)  Resp 18  SpO2 100%  LMP 07/28/2013  Breastfeeding? No Physical Exam  Constitutional: She appears  well-developed and well-nourished. No distress.  HENT:  Head: Normocephalic.  Neck: Neck supple.  Cardiovascular: Normal rate.   Pulmonary/Chest: Effort normal. She has no wheezes.  Musculoskeletal: Normal range of motion. She exhibits no edema.  Skin: Rash noted.  Papular lesions posterior neck and behind ears. Excoriations.  No surrounding erythema, no drainage.  Not involving scalp. Small, pointing  nondraining papule right distal volar thumb.  No surrounding erythema, no red streaking.    ED Course  Procedures (including critical care time)  INCISION AND DRAINAGE Performed by: Burgess Amor Consent: Verbal consent obtained. Risks and benefits: risks, benefits and alternatives were discussed Type: abscess  Body area: right thumb  Anesthesia: local infiltration  Incision was made with a scalpel.  Local anesthetic: lidocaine 2% without epi.  Digital block  Anesthetic total: 1 ml  Complexity: simple Blunt dissection to break up loculations  Drainage: purulent  Drainage amount: small  Packing material: no packing Patient tolerance: Patient tolerated the procedure well with no immediate complications.    Labs Review Labs Reviewed - No data to display Imaging Review No results found.  MDM   1. Abscess of finger of right hand   2. Contact dermatitis due to chemicals    Patient encouraged warm compresses or epsom salt soaks, prn f/u for thumb.  Prescribed bactrim  Hydrocodone.  Triamcinolone cream for neck rash which is suspect is contact dermatitis.  Benadryl recommended.    Burgess Amor, PA-C 08/26/13 2115

## 2013-08-26 NOTE — ED Provider Notes (Signed)
Medical screening examination/treatment/procedure(s) were performed by non-physician practitioner and as supervising physician I was immediately available for consultation/collaboration.  Donnetta Hutching, MD 08/26/13 2132

## 2014-05-03 ENCOUNTER — Emergency Department (HOSPITAL_COMMUNITY): Payer: No Typology Code available for payment source

## 2014-05-03 ENCOUNTER — Emergency Department (HOSPITAL_COMMUNITY)
Admission: EM | Admit: 2014-05-03 | Discharge: 2014-05-03 | Disposition: A | Discharge status: Home / Self Care | Payer: No Typology Code available for payment source | Attending: Emergency Medicine | Admitting: Emergency Medicine

## 2014-05-03 ENCOUNTER — Encounter (HOSPITAL_COMMUNITY): Payer: Self-pay | Admitting: Radiology

## 2014-05-03 DIAGNOSIS — F410 Panic disorder [episodic paroxysmal anxiety] without agoraphobia: Secondary | ICD-10-CM | POA: Insufficient documentation

## 2014-05-03 DIAGNOSIS — J45909 Unspecified asthma, uncomplicated: Secondary | ICD-10-CM | POA: Insufficient documentation

## 2014-05-03 DIAGNOSIS — Z23 Encounter for immunization: Secondary | ICD-10-CM | POA: Insufficient documentation

## 2014-05-03 DIAGNOSIS — S0100XA Unspecified open wound of scalp, initial encounter: Secondary | ICD-10-CM | POA: Insufficient documentation

## 2014-05-03 DIAGNOSIS — IMO0002 Reserved for concepts with insufficient information to code with codable children: Secondary | ICD-10-CM | POA: Insufficient documentation

## 2014-05-03 DIAGNOSIS — F17200 Nicotine dependence, unspecified, uncomplicated: Secondary | ICD-10-CM | POA: Insufficient documentation

## 2014-05-03 DIAGNOSIS — S060X0A Concussion without loss of consciousness, initial encounter: Secondary | ICD-10-CM | POA: Insufficient documentation

## 2014-05-03 DIAGNOSIS — Z79899 Other long term (current) drug therapy: Secondary | ICD-10-CM | POA: Insufficient documentation

## 2014-05-03 DIAGNOSIS — S060XAA Concussion with loss of consciousness status unknown, initial encounter: Secondary | ICD-10-CM

## 2014-05-03 DIAGNOSIS — R000 Tachycardia, unspecified: Secondary | ICD-10-CM | POA: Insufficient documentation

## 2014-05-03 DIAGNOSIS — S46909A Unspecified injury of unspecified muscle, fascia and tendon at shoulder and upper arm level, unspecified arm, initial encounter: Secondary | ICD-10-CM | POA: Insufficient documentation

## 2014-05-03 DIAGNOSIS — S060X9A Concussion with loss of consciousness of unspecified duration, initial encounter: Secondary | ICD-10-CM

## 2014-05-03 DIAGNOSIS — Z86711 Personal history of pulmonary embolism: Secondary | ICD-10-CM | POA: Insufficient documentation

## 2014-05-03 DIAGNOSIS — Z21 Asymptomatic human immunodeficiency virus [HIV] infection status: Secondary | ICD-10-CM | POA: Insufficient documentation

## 2014-05-03 DIAGNOSIS — Z872 Personal history of diseases of the skin and subcutaneous tissue: Secondary | ICD-10-CM | POA: Insufficient documentation

## 2014-05-03 DIAGNOSIS — S4980XA Other specified injuries of shoulder and upper arm, unspecified arm, initial encounter: Secondary | ICD-10-CM | POA: Insufficient documentation

## 2014-05-03 DIAGNOSIS — F329 Major depressive disorder, single episode, unspecified: Secondary | ICD-10-CM | POA: Insufficient documentation

## 2014-05-03 DIAGNOSIS — Z8719 Personal history of other diseases of the digestive system: Secondary | ICD-10-CM | POA: Insufficient documentation

## 2014-05-03 DIAGNOSIS — S298XXA Other specified injuries of thorax, initial encounter: Secondary | ICD-10-CM | POA: Insufficient documentation

## 2014-05-03 DIAGNOSIS — F3289 Other specified depressive episodes: Secondary | ICD-10-CM | POA: Insufficient documentation

## 2014-05-03 DIAGNOSIS — F411 Generalized anxiety disorder: Secondary | ICD-10-CM | POA: Insufficient documentation

## 2014-05-03 DIAGNOSIS — Y92410 Unspecified street and highway as the place of occurrence of the external cause: Secondary | ICD-10-CM | POA: Insufficient documentation

## 2014-05-03 DIAGNOSIS — S0101XA Laceration without foreign body of scalp, initial encounter: Secondary | ICD-10-CM

## 2014-05-03 DIAGNOSIS — Z862 Personal history of diseases of the blood and blood-forming organs and certain disorders involving the immune mechanism: Secondary | ICD-10-CM | POA: Insufficient documentation

## 2014-05-03 DIAGNOSIS — Y9389 Activity, other specified: Secondary | ICD-10-CM | POA: Insufficient documentation

## 2014-05-03 MED ORDER — ONDANSETRON HCL 4 MG/2ML IJ SOLN
INTRAMUSCULAR | Status: DC
Start: 2014-05-03 — End: 2014-05-03
  Filled 2014-05-03: qty 2

## 2014-05-03 MED ORDER — TETANUS-DIPHTHERIA TOXOIDS TD 5-2 LFU IM INJ
0.5000 mL | INJECTION | Freq: Once | INTRAMUSCULAR | Status: AC
  Administered 2014-05-03: 0.5 mL via INTRAMUSCULAR
  Filled 2014-05-03: qty 0.5

## 2014-05-03 MED ORDER — ONDANSETRON HCL 4 MG/2ML IJ SOLN
4.0000 mg | Freq: Once | INTRAMUSCULAR | Status: AC
  Administered 2014-05-03: 4 mg via INTRAVENOUS

## 2014-05-03 MED ORDER — SODIUM CHLORIDE 0.9 % IV BOLUS (SEPSIS)
1000.0000 mL | Freq: Once | INTRAVENOUS | Status: AC
  Administered 2014-05-03: 1000 mL via INTRAVENOUS

## 2014-05-03 MED ORDER — MORPHINE SULFATE 2 MG/ML IJ SOLN
INTRAMUSCULAR | Status: AC
  Administered 2014-05-03: 4 mg via INTRAVENOUS
  Filled 2014-05-03: qty 2

## 2014-05-03 MED ORDER — KETOROLAC TROMETHAMINE 30 MG/ML IJ SOLN
30.0000 mg | Freq: Once | INTRAMUSCULAR | Status: AC
  Administered 2014-05-03: 30 mg via INTRAVENOUS
  Filled 2014-05-03: qty 1

## 2014-05-03 MED ORDER — MORPHINE SULFATE 4 MG/ML IJ SOLN: 4.0000 mg | Freq: Once | INTRAMUSCULAR | Status: AC
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
## 2014-05-03 NOTE — ED Notes (Addendum)
Patient ambulated to the restroom by herself, with minimal assistance.

## 2014-05-03 NOTE — Discharge Instructions (Signed)

## 2014-05-03 NOTE — ED Notes (Signed)
MD at bedside suturing pt.

## 2014-05-03 NOTE — ED Notes (Signed)
Pt reports having 4 beers today.
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
## 2014-05-03 NOTE — ED Notes (Signed)
Pt tolerated staples well

## 2014-05-03 NOTE — ED Notes (Signed)
Kennyth Lose, pts mother [phone]

## 2014-05-03 NOTE — ED Notes (Signed)
Pt ambulating independently w/ steady gait on d/c in no acute distress, A&Ox4.D/c instructions reviewed w/ pt and family - pt and family deny any further questions or concerns at present.  

## 2014-05-09 ENCOUNTER — Emergency Department (HOSPITAL_COMMUNITY)
Admission: EM | Admit: 2014-05-09 | Discharge: 2014-05-09 | Disposition: A | Discharge status: Home / Self Care | Payer: Self-pay | Attending: Emergency Medicine | Admitting: Emergency Medicine

## 2014-05-09 ENCOUNTER — Encounter (HOSPITAL_COMMUNITY): Payer: Self-pay | Admitting: Emergency Medicine

## 2014-05-09 DIAGNOSIS — Z872 Personal history of diseases of the skin and subcutaneous tissue: Secondary | ICD-10-CM | POA: Insufficient documentation

## 2014-05-09 DIAGNOSIS — Z86711 Personal history of pulmonary embolism: Secondary | ICD-10-CM | POA: Insufficient documentation

## 2014-05-09 DIAGNOSIS — Z4802 Encounter for removal of sutures: Secondary | ICD-10-CM | POA: Insufficient documentation

## 2014-05-09 DIAGNOSIS — Z21 Asymptomatic human immunodeficiency virus [HIV] infection status: Secondary | ICD-10-CM | POA: Insufficient documentation

## 2014-05-09 DIAGNOSIS — Z8719 Personal history of other diseases of the digestive system: Secondary | ICD-10-CM | POA: Insufficient documentation

## 2014-05-09 DIAGNOSIS — F17200 Nicotine dependence, unspecified, uncomplicated: Secondary | ICD-10-CM | POA: Insufficient documentation

## 2014-05-09 DIAGNOSIS — J45909 Unspecified asthma, uncomplicated: Secondary | ICD-10-CM | POA: Insufficient documentation

## 2014-05-09 DIAGNOSIS — R51 Headache: Secondary | ICD-10-CM | POA: Insufficient documentation

## 2014-05-09 DIAGNOSIS — Z8659 Personal history of other mental and behavioral disorders: Secondary | ICD-10-CM | POA: Insufficient documentation

## 2014-05-09 DIAGNOSIS — Z862 Personal history of diseases of the blood and blood-forming organs and certain disorders involving the immune mechanism: Secondary | ICD-10-CM | POA: Insufficient documentation

## 2014-05-09 NOTE — ED Notes (Signed)
Staples removed without difficulty.

## 2014-05-09 NOTE — ED Notes (Signed)
Pt states she is here to have staples removed which her placed April 5th.
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
## 2014-05-15 NOTE — ED Provider Notes (Signed)
Medical screening examination/treatment/procedure(s) were performed by non-physician practitioner and as supervising physician I was immediately available for consultation/collaboration.   EKG Interpretation None      Rolland Porter, MD, Abram Sander   Janice Norrie, MD 05/15/14 [phone]

## 2014-09-23 ENCOUNTER — Inpatient Hospital Stay (HOSPITAL_COMMUNITY)
Admission: EM | Admit: 2014-09-23 | Discharge: 2014-09-27 | DRG: 433 | Disposition: A | Discharge status: Home / Self Care | Payer: Self-pay | Attending: Internal Medicine | Admitting: Internal Medicine

## 2014-09-23 ENCOUNTER — Encounter (HOSPITAL_COMMUNITY): Payer: Self-pay | Admitting: Emergency Medicine

## 2014-09-23 ENCOUNTER — Emergency Department (HOSPITAL_COMMUNITY): Payer: Self-pay

## 2014-09-23 DIAGNOSIS — E876 Hypokalemia: Secondary | ICD-10-CM | POA: Diagnosis present

## 2014-09-23 DIAGNOSIS — B179 Acute viral hepatitis, unspecified: Secondary | ICD-10-CM | POA: Diagnosis present

## 2014-09-23 DIAGNOSIS — Z9119 Patient's noncompliance with other medical treatment and regimen: Secondary | ICD-10-CM | POA: Diagnosis present

## 2014-09-23 DIAGNOSIS — F32A Depression, unspecified: Secondary | ICD-10-CM | POA: Diagnosis present

## 2014-09-23 DIAGNOSIS — Z833 Family history of diabetes mellitus: Secondary | ICD-10-CM

## 2014-09-23 DIAGNOSIS — D538 Other specified nutritional anemias: Secondary | ICD-10-CM

## 2014-09-23 DIAGNOSIS — E860 Dehydration: Secondary | ICD-10-CM | POA: Diagnosis present

## 2014-09-23 DIAGNOSIS — D735 Infarction of spleen: Secondary | ICD-10-CM | POA: Diagnosis present

## 2014-09-23 DIAGNOSIS — Z8249 Family history of ischemic heart disease and other diseases of the circulatory system: Secondary | ICD-10-CM

## 2014-09-23 DIAGNOSIS — F1910 Other psychoactive substance abuse, uncomplicated: Secondary | ICD-10-CM | POA: Diagnosis present

## 2014-09-23 DIAGNOSIS — F909 Attention-deficit hyperactivity disorder, unspecified type: Secondary | ICD-10-CM | POA: Diagnosis present

## 2014-09-23 DIAGNOSIS — F1010 Alcohol abuse, uncomplicated: Secondary | ICD-10-CM

## 2014-09-23 DIAGNOSIS — K219 Gastro-esophageal reflux disease without esophagitis: Secondary | ICD-10-CM | POA: Diagnosis present

## 2014-09-23 DIAGNOSIS — K7010 Alcoholic hepatitis without ascites: Principal | ICD-10-CM | POA: Diagnosis present

## 2014-09-23 DIAGNOSIS — N390 Urinary tract infection, site not specified: Secondary | ICD-10-CM | POA: Diagnosis present

## 2014-09-23 DIAGNOSIS — G8929 Other chronic pain: Secondary | ICD-10-CM | POA: Diagnosis present

## 2014-09-23 DIAGNOSIS — Z86711 Personal history of pulmonary embolism: Secondary | ICD-10-CM

## 2014-09-23 DIAGNOSIS — Z818 Family history of other mental and behavioral disorders: Secondary | ICD-10-CM

## 2014-09-23 DIAGNOSIS — R195 Other fecal abnormalities: Secondary | ICD-10-CM

## 2014-09-23 DIAGNOSIS — F10939 Alcohol use, unspecified with withdrawal, unspecified: Secondary | ICD-10-CM | POA: Diagnosis present

## 2014-09-23 DIAGNOSIS — B1920 Unspecified viral hepatitis C without hepatic coma: Secondary | ICD-10-CM | POA: Diagnosis present

## 2014-09-23 DIAGNOSIS — F1290 Cannabis use, unspecified, uncomplicated: Secondary | ICD-10-CM | POA: Diagnosis present

## 2014-09-23 DIAGNOSIS — F10930 Alcohol use, unspecified with withdrawal, uncomplicated: Secondary | ICD-10-CM

## 2014-09-23 DIAGNOSIS — D649 Anemia, unspecified: Secondary | ICD-10-CM | POA: Diagnosis present

## 2014-09-23 DIAGNOSIS — F17210 Nicotine dependence, cigarettes, uncomplicated: Secondary | ICD-10-CM | POA: Diagnosis present

## 2014-09-23 DIAGNOSIS — I82890 Acute embolism and thrombosis of other specified veins: Secondary | ICD-10-CM | POA: Diagnosis present

## 2014-09-23 DIAGNOSIS — R17 Unspecified jaundice: Secondary | ICD-10-CM

## 2014-09-23 DIAGNOSIS — R1011 Right upper quadrant pain: Secondary | ICD-10-CM

## 2014-09-23 DIAGNOSIS — F10239 Alcohol dependence with withdrawal, unspecified: Secondary | ICD-10-CM | POA: Diagnosis present

## 2014-09-23 DIAGNOSIS — D6861 Antiphospholipid syndrome: Secondary | ICD-10-CM | POA: Diagnosis present

## 2014-09-23 DIAGNOSIS — Z23 Encounter for immunization: Secondary | ICD-10-CM

## 2014-09-23 DIAGNOSIS — F410 Panic disorder [episodic paroxysmal anxiety] without agoraphobia: Secondary | ICD-10-CM | POA: Diagnosis present

## 2014-09-23 DIAGNOSIS — F319 Bipolar disorder, unspecified: Secondary | ICD-10-CM | POA: Diagnosis present

## 2014-09-23 DIAGNOSIS — F10230 Alcohol dependence with withdrawal, uncomplicated: Secondary | ICD-10-CM

## 2014-09-23 DIAGNOSIS — J45909 Unspecified asthma, uncomplicated: Secondary | ICD-10-CM | POA: Diagnosis present

## 2014-09-23 DIAGNOSIS — F17200 Nicotine dependence, unspecified, uncomplicated: Secondary | ICD-10-CM

## 2014-09-23 DIAGNOSIS — Z7901 Long term (current) use of anticoagulants: Secondary | ICD-10-CM

## 2014-09-23 DIAGNOSIS — F329 Major depressive disorder, single episode, unspecified: Secondary | ICD-10-CM

## 2014-09-23 DIAGNOSIS — R1013 Epigastric pain: Secondary | ICD-10-CM

## 2014-09-23 LAB — CBC WITH DIFFERENTIAL/PLATELET
BASOS ABS: 0.1 10*3/uL (ref 0.0–0.1)
Basophils Relative: 1 % (ref 0–1)
EOS PCT: 3 % (ref 0–5)
Eosinophils Absolute: 0.2 10*3/uL (ref 0.0–0.7)
HEMATOCRIT: 35.3 % — AB (ref 36.0–46.0)
Hemoglobin: 12 g/dL (ref 12.0–15.0)
LYMPHS ABS: 2.6 10*3/uL (ref 0.7–4.0)
LYMPHS PCT: 35 % (ref 12–46)
MCH: 27.5 pg (ref 26.0–34.0)
MCHC: 34 g/dL (ref 30.0–36.0)
MCV: 81 fL (ref 78.0–100.0)
MONOS PCT: 13 % — AB (ref 3–12)
Monocytes Absolute: 1 10*3/uL (ref 0.1–1.0)
NEUTROS ABS: 3.6 10*3/uL (ref 1.7–7.7)
Neutrophils Relative %: 48 % (ref 43–77)
Platelets: 544 10*3/uL — ABNORMAL HIGH (ref 150–400)
RBC: 4.36 MIL/uL (ref 3.87–5.11)
RDW: 21.3 % — ABNORMAL HIGH (ref 11.5–15.5)
WBC: 7.5 10*3/uL (ref 4.0–10.5)

## 2014-09-23 LAB — HEPATITIS PANEL, ACUTE
HCV Ab: REACTIVE — AB
HEP A IGM: NONREACTIVE
HEP B C IGM: NONREACTIVE
HEP B S AG: NEGATIVE

## 2014-09-23 LAB — URINALYSIS, ROUTINE W REFLEX MICROSCOPIC
GLUCOSE, UA: NEGATIVE mg/dL
Ketones, ur: 15 mg/dL — AB
Leukocytes, UA: NEGATIVE
Nitrite: POSITIVE — AB
PH: 7 (ref 5.0–8.0)
SPECIFIC GRAVITY, URINE: 1.015 (ref 1.005–1.030)
Urobilinogen, UA: 2 mg/dL — ABNORMAL HIGH (ref 0.0–1.0)

## 2014-09-23 LAB — RAPID URINE DRUG SCREEN, HOSP PERFORMED
AMPHETAMINES: NOT DETECTED
BARBITURATES: NOT DETECTED
BENZODIAZEPINES: NOT DETECTED
COCAINE: NOT DETECTED
OPIATES: NOT DETECTED
TETRAHYDROCANNABINOL: POSITIVE — AB

## 2014-09-23 LAB — COMPREHENSIVE METABOLIC PANEL
ALBUMIN: 3.8 g/dL (ref 3.5–5.2)
ALK PHOS: 397 U/L — AB (ref 39–117)
ALT: 500 U/L — AB (ref 0–35)
ANION GAP: 20 — AB (ref 5–15)
AST: 925 U/L — ABNORMAL HIGH (ref 0–37)
BILIRUBIN TOTAL: 10.7 mg/dL — AB (ref 0.3–1.2)
BUN: 7 mg/dL (ref 6–23)
CHLORIDE: 92 meq/L — AB (ref 96–112)
CO2: 23 mEq/L (ref 19–32)
Calcium: 9.3 mg/dL (ref 8.4–10.5)
Creatinine, Ser: 0.67 mg/dL (ref 0.50–1.10)
GFR calc Af Amer: >90 mL/min (ref >90)
GFR calc non Af Amer: >90 mL/min (ref >90)
GLUCOSE: 103 mg/dL — AB (ref 70–99)
POTASSIUM: 3.2 meq/L — AB (ref 3.7–5.3)
SODIUM: 135 meq/L — AB (ref 137–147)
TOTAL PROTEIN: 8.5 g/dL — AB (ref 6.0–8.3)

## 2014-09-23 LAB — ACETAMINOPHEN LEVEL: Acetaminophen (Tylenol), Serum: <10 ug/mL — ABNORMAL LOW (ref 10–30)

## 2014-09-23 LAB — LIPASE, BLOOD: Lipase: 19 U/L (ref 11–59)

## 2014-09-23 LAB — ETHANOL: Alcohol, Ethyl (B): <11 mg/dL (ref 0–11)

## 2014-09-23 LAB — APTT: aPTT: 29 seconds (ref 24–37)

## 2014-09-23 LAB — URINE MICROSCOPIC-ADD ON

## 2014-09-23 LAB — PROTIME-INR
INR: 1.18 (ref 0.00–1.49)
Prothrombin Time: 15 seconds (ref 11.6–15.2)

## 2014-09-23 LAB — SALICYLATE LEVEL

## 2014-09-23 LAB — PREGNANCY, URINE: Preg Test, Ur: NEGATIVE

## 2014-09-23 LAB — BILIRUBIN, DIRECT: Bilirubin, Direct: 5 mg/dL — ABNORMAL HIGH (ref 0.0–0.3)

## 2014-09-23 MED ORDER — SODIUM CHLORIDE 0.9 % IJ SOLN
INTRAMUSCULAR | Status: AC
  Filled 2014-09-23: qty 45

## 2014-09-23 MED ORDER — DOCUSATE SODIUM 100 MG PO CAPS
100.0000 mg | ORAL_CAPSULE | Freq: Every day | ORAL | Status: DC | PRN
  Administered 2014-09-25: 100 mg via ORAL
  Filled 2014-09-23: qty 1

## 2014-09-23 MED ORDER — FENTANYL CITRATE 0.05 MG/ML IJ SOLN
50.0000 ug | Freq: Once | INTRAMUSCULAR | Status: AC
  Administered 2014-09-23: 50 ug via INTRAVENOUS
  Filled 2014-09-23: qty 2

## 2014-09-23 MED ORDER — ACETAMINOPHEN 650 MG RE SUPP: 650.0000 mg | Freq: Four times a day (QID) | RECTAL | Status: DC | PRN

## 2014-09-23 MED ORDER — ONDANSETRON HCL 4 MG/2ML IJ SOLN
4.0000 mg | Freq: Three times a day (TID) | INTRAMUSCULAR | Status: AC | PRN
Start: 2014-09-23 — End: 2014-09-24
  Administered 2014-09-24: 4 mg via INTRAVENOUS
  Filled 2014-09-23: qty 2

## 2014-09-23 MED ORDER — WARFARIN - PHARMACIST DOSING INPATIENT: Freq: Every day | Status: DC

## 2014-09-23 MED ORDER — INFLUENZA VAC SPLIT QUAD 0.5 ML IM SUSY
0.5000 mL | PREFILLED_SYRINGE | INTRAMUSCULAR | Status: AC
  Administered 2014-09-24: 0.5 mL via INTRAMUSCULAR
  Filled 2014-09-23: qty 0.5

## 2014-09-23 MED ORDER — WARFARIN SODIUM 5 MG PO TABS
7.5000 mg | ORAL_TABLET | Freq: Once | ORAL | Status: AC
  Administered 2014-09-23: 7.5 mg via ORAL
  Filled 2014-09-23: qty 2

## 2014-09-23 MED ORDER — PNEUMOCOCCAL VAC POLYVALENT 25 MCG/0.5ML IJ INJ
0.5000 mL | INJECTION | INTRAMUSCULAR | Status: AC
  Administered 2014-09-24: 0.5 mL via INTRAMUSCULAR
  Filled 2014-09-23: qty 0.5

## 2014-09-23 MED ORDER — FENTANYL CITRATE 0.05 MG/ML IJ SOLN: 50.0000 ug | INTRAMUSCULAR | Status: DC | PRN

## 2014-09-23 MED ORDER — THIAMINE HCL 100 MG/ML IJ SOLN
100.0000 mg | Freq: Every day | INTRAMUSCULAR | Status: DC
  Filled 2014-09-23: qty 2

## 2014-09-23 MED ORDER — DIPHENHYDRAMINE HCL 50 MG/ML IJ SOLN
25.0000 mg | Freq: Once | INTRAMUSCULAR | Status: AC
  Administered 2014-09-23: 25 mg via INTRAVENOUS
  Filled 2014-09-23: qty 1

## 2014-09-23 MED ORDER — ADULT MULTIVITAMIN W/MINERALS CH
1.0000 | ORAL_TABLET | Freq: Every day | ORAL | Status: DC
  Administered 2014-09-23 – 2014-09-27 (×5): 1 via ORAL
  Filled 2014-09-23 (×5): qty 1

## 2014-09-23 MED ORDER — ONDANSETRON HCL 4 MG/2ML IJ SOLN
4.0000 mg | Freq: Once | INTRAMUSCULAR | Status: AC
  Administered 2014-09-23: 4 mg via INTRAVENOUS
  Filled 2014-09-23: qty 2

## 2014-09-23 MED ORDER — SODIUM CHLORIDE 0.9 % IV SOLN: 1000.0000 mL | INTRAVENOUS | Status: DC

## 2014-09-23 MED ORDER — IOHEXOL 300 MG/ML  SOLN
100.0000 mL | Freq: Once | INTRAMUSCULAR | Status: AC | PRN
Start: 2014-09-23 — End: 2014-09-23
  Administered 2014-09-23: 100 mL via INTRAVENOUS

## 2014-09-23 MED ORDER — SODIUM CHLORIDE 0.9 % IV SOLN
1000.0000 mL | Freq: Once | INTRAVENOUS | Status: AC
  Administered 2014-09-23: 1000 mL via INTRAVENOUS

## 2014-09-23 MED ORDER — METOCLOPRAMIDE HCL 5 MG/ML IJ SOLN
10.0000 mg | Freq: Once | INTRAMUSCULAR | Status: AC
  Administered 2014-09-23: 10 mg via INTRAVENOUS
  Filled 2014-09-23: qty 2

## 2014-09-23 MED ORDER — SODIUM CHLORIDE 0.9 % IV SOLN
INTRAVENOUS | Status: DC
  Administered 2014-09-23: 11:00:00 via INTRAVENOUS

## 2014-09-23 MED ORDER — ACETAMINOPHEN 325 MG PO TABS: 650.0000 mg | ORAL_TABLET | Freq: Four times a day (QID) | ORAL | Status: DC | PRN

## 2014-09-23 MED ORDER — FOLIC ACID 1 MG PO TABS
1.0000 mg | ORAL_TABLET | Freq: Every day | ORAL | Status: DC
  Administered 2014-09-23 – 2014-09-27 (×5): 1 mg via ORAL
  Filled 2014-09-23 (×5): qty 1

## 2014-09-23 MED ORDER — LORAZEPAM 2 MG/ML IJ SOLN
1.0000 mg | Freq: Four times a day (QID) | INTRAMUSCULAR | Status: AC | PRN
  Filled 2014-09-23: qty 1

## 2014-09-23 MED ORDER — ENOXAPARIN SODIUM 120 MG/0.8ML ~~LOC~~ SOLN
1.5000 mg/kg | SUBCUTANEOUS | Status: DC
  Administered 2014-09-23 – 2014-09-26 (×4): 110 mg via SUBCUTANEOUS
  Filled 2014-09-23 (×5): qty 0.8

## 2014-09-23 MED ORDER — ENOXAPARIN SODIUM 120 MG/0.8ML ~~LOC~~ SOLN
SUBCUTANEOUS | Status: AC
  Filled 2014-09-23: qty 0.8

## 2014-09-23 MED ORDER — OXYCODONE HCL 5 MG PO TABS
10.0000 mg | ORAL_TABLET | Freq: Four times a day (QID) | ORAL | Status: DC | PRN
  Administered 2014-09-23 – 2014-09-26 (×9): 10 mg via ORAL
  Filled 2014-09-23 (×9): qty 2

## 2014-09-23 MED ORDER — VITAMIN B-1 100 MG PO TABS
100.0000 mg | ORAL_TABLET | Freq: Every day | ORAL | Status: DC
  Administered 2014-09-23 – 2014-09-27 (×5): 100 mg via ORAL
  Filled 2014-09-23 (×5): qty 1

## 2014-09-23 MED ORDER — SODIUM CHLORIDE 0.9 % IV SOLN: 1000.0000 mL | Freq: Once | INTRAVENOUS | Status: DC

## 2014-09-23 MED ORDER — IOHEXOL 300 MG/ML  SOLN
50.0000 mL | Freq: Once | INTRAMUSCULAR | Status: AC | PRN
Start: 2014-09-23 — End: 2014-09-23
  Administered 2014-09-23: 50 mL via ORAL

## 2014-09-23 MED ORDER — ALUM & MAG HYDROXIDE-SIMETH 200-200-20 MG/5ML PO SUSP: 30.0000 mL | Freq: Four times a day (QID) | ORAL | Status: DC | PRN

## 2014-09-23 MED ORDER — LORAZEPAM 1 MG PO TABS
1.0000 mg | ORAL_TABLET | Freq: Four times a day (QID) | ORAL | Status: AC | PRN
  Administered 2014-09-23 – 2014-09-26 (×8): 1 mg via ORAL
  Filled 2014-09-23 (×8): qty 1

## 2014-09-23 MED ORDER — SODIUM CHLORIDE 0.9 % IV SOLN
INTRAVENOUS | Status: DC
  Administered 2014-09-23 – 2014-09-25 (×4): via INTRAVENOUS

## 2014-09-23 MED ORDER — WARFARIN SODIUM 5 MG PO TABS: 5.0000 mg | ORAL_TABLET | Freq: Once | ORAL | Status: DC

## 2014-09-23 NOTE — H&P (Signed)
History and Physical  [name] XIP:[id number] DOB: December 14, 1991 DOA: 09/23/2014  Referring physician: Dr Rolland Porter, ED physician PCP: No PCP currently - has seen Dr. Legrand Rams approximately 3 years ago.  Chief Complaint: Abdominal pain, nausea, vomiting  HPI: [name] is a 23 y.o. female  With a history of lupus anticoagulant / antiphospholipid antibody syndrome who is currently untreated who presents to emergency department with abdominal pain that started at approximately 9 AM this morning. The pain is located in her left and right flanks and nonradiating. It is accompanied by nausea, vomiting, diarrhea. It has progressed since that time. She characterizes the pain as sharp and constant.  Additionally, she admits to drinking alcohol - approximately one bottle of one day and has been doing this for several months. When she stops drinking she develops tremors. She denies black out episodes or the other episodes of loss of consciousness. Her last drink was yesterday evening but has not had anything to drink today.  Review of Systems:   Pt complains of nausea, vomiting, diarrhea. She also admits to tea colored urine.  Pt denies any chest pain, shortness of breath, hematemesis, melena, hematochezia.  Review of systems are otherwise negative  Past Medical History  Diagnosis Date  . Pulmonary embolism 06/2010    Multiple bilateral pulmonary emboli diagnosed by CT scan in in 06/2010.  Moderate size pleural effusion and right lower lobe infarction present at that time.  Lupus anticoagulant was present, but all other aspects of her hypercoagulable evaluation were negative including all Cardiolite and antibodies.    . Anticoagulant long-term use   . Lupus anticoagulant disorder     Previously followed by Dr. Lieutenant Diego of discharged from his care due to medical noncompliance.  Marland Kitchen HIV positive     Western Blot negative  . Tobacco abuse   . Anemia   . Substance abuse     cocaine,  opiates, marijuana; alcohol; most recent positive test for cocaine was in December of 2011; alcohol levels have been as high as 302  . Anxiety and depression     suicide attempt-age 22  . Attention deficit hyperactivity disorder   . Asthma     ABG in 11/2010:7.45, 34, 79., ventolin rescue inhaler last use 03/21/12  . Anxiety   . Panic attacks     not on meds  . Bipolar disorder     not on meds  . Abnormal Pap smear   . GERD (gastroesophageal reflux disease)   . Recurrent upper respiratory infection (URI)   . Cough productive of purulent sputum   . Edema in pregnancy   . Shortness of breath     has asthma- scheduled for PFTs on 04/06/12  . Dermatitis due to detergents   . Antiphospholipid antibody with hypercoagulable state   . Depression     suicide attempt at age 69   Past Surgical History  Procedure Laterality Date  . Cesarean section  2011, 2009  . Wrist surgery  2009    hemoangioma removed from right wrist  . Cesarean section  04/08/2012    Procedure: CESAREAN SECTION;  Surgeon: Donnamae Jude, MD;  Location: [facility];  Service: Gynecology;  Laterality: N/A;  . Tubal ligation  04/08/2012    Procedure: BILATERAL TUBAL LIGATION;  Surgeon: Donnamae Jude, MD;  Location: [facility];  Service: Gynecology;  Laterality: Bilateral;  . Hemangioma excision  2013    R wrist   Social History:  reports that she quit smoking  about 3 weeks ago. Her smoking use included Cigarettes. She smoked 0.50 packs per day. She has never used smokeless tobacco. She reports that she drinks about 3.6 ounces of alcohol per week. She reports that she uses illicit drugs (Marijuana and Other-see comments). Patient lives at home & is able to participate in activities of daily living  No Known Allergies  Family History  Problem Relation Age of Onset  . Drug abuse Mother     History   . Depression Mother   . Drug abuse Father   . Heart attack Father 25    H/o drug abuse  . Anesthesia problems Neg Hx   .  Hypotension Neg Hx   . Malignant hyperthermia Neg Hx   . Pseudochol deficiency Neg Hx   . Depression Sister   . Depression Maternal Grandmother   . Diabetes Maternal Grandfather       Prior to Admission medications   Not on File    Physical Exam: BP 105/90  Pulse 82  Temp(Src) 98.6 F (37 C) (Oral)  Resp 14  Ht 5\' 7"  (1.702 m)  Wt 72.576 kg (160 lb)  BMI 25.05 kg/m2  SpO2 100%  General: Young Caucasian female. Awake and alert and oriented x3. No acute cardiopulmonary distress.  Eyes: Pupils equal, round, reactive to light. Extraocular muscles are intact. Sclerae anicteric and noninjected.  ENT: Moist mucosal membranes. No mucosal lesions. Teeth in moderate repair  Neck: Neck supple without lymphadenopathy. No carotid bruits. No masses palpated.  Cardiovascular: Regular rate with normal S1-S2 sounds. No murmurs, rubs, gallops auscultated. No JVD.  Respiratory: Good respiratory effort with no wheezes, rales, rhonchi. Lungs clear to auscultation bilaterally.  Abdomen: Soft, nondistended.  Tender right upper quadrant with enlarged liver. Active bowel sounds. No masses or hepatosplenomegaly  Skin: Dry, warm to touch. 2+ dorsalis pedis and radial pulses. Musculoskeletal: No calf or leg pain. All major joints not erythematous nontender.  Psychiatric: Intact judgment and insight.  Neurologic: No focal neurological deficits. Cranial nerves II through XII are grossly intact.           Labs on Admission:  Basic Metabolic Panel:  Recent Labs Lab 09/23/14 1020  NA 135*  K 3.2*  CL 92*  CO2 23  GLUCOSE 103*  BUN 7  CREATININE 0.67  CALCIUM 9.3   Liver Function Tests:  Recent Labs Lab 09/23/14 1020  AST 925*  ALT 500*  ALKPHOS 397*  BILITOT 10.7*  PROT 8.5*  ALBUMIN 3.8    Recent Labs Lab 09/23/14 1020  LIPASE 19   No results found for this basename: AMMONIA,  in the last 168 hours CBC:  Recent Labs Lab 09/23/14 1020  WBC 7.5  NEUTROABS 3.6  HGB 12.0    HCT 35.3*  MCV 81.0  PLT 544*   Cardiac Enzymes: No results found for this basename: CKTOTAL, CKMB, CKMBINDEX, TROPONINI,  in the last 168 hours  BNP (last 3 results) No results found for this basename: PROBNP,  in the last 8760 hours CBG: No results found for this basename: GLUCAP,  in the last 168 hours  Radiological Exams on Admission: US Abdomen Complete  09/23/2014   CLINICAL DATA:  Right upper quadrant pain. Acute onset of jaundice. History of lupus.  EXAM: ULTRASOUND ABDOMEN COMPLETE  COMPARISON:  11/23/2005  FINDINGS: Gallbladder:  Echogenic structure along the gallbladder wall measures 0.5 cm. This structure is non mobile and suggestive for a gallbladder polyp. No definite gallstones. Reportedly, the patient has a sonographic  Murphy's sign.  Common bile duct:  Diameter: 0.4 cm  Liver:  Liver parenchyma is within normal limits. The main portal vein is patent. No focal liver lesion.  IVC:  No abnormality visualized.  Pancreas:  There is echogenic material within the splenic vein near the portal confluence. Findings are suggestive for venous thrombus. The adjacent pancreas is mildly heterogeneous without duct dilatation.  Spleen:  Measures roughly 10.1 cm in length and the splenic volume is 330 mL.  Right Kidney:  Length: 11.5 cm. No evidence hydronephrosis. There are echogenic foci in the lower pole which could represent stones. In addition, there is a round hypoechoic structure in the right kidney that roughly measures 1.1 cm and probably represents a cyst.  Left Kidney:  Length: 11.9 cm. Echogenicity within normal limits. No mass or hydronephrosis visualized.  Abdominal aorta:  No aneurysm visualized.  Other findings:  None.  IMPRESSION: Echogenic material in the splenic vein near the portal vein confluence. Findings are concerning for venous thrombus. Recommend further evaluation with a CT with intravenous contrast.  Gallbladder polyp measuring 0.5 cm.  Probable right renal cyst.  Echogenic  foci in the right kidney could represent kidney stones. Negative for hydronephrosis.  These results were called by telephone at the time of interpretation on 09/23/2014 at 1:30 pm to Dr. Rolland Porter , who verbally acknowledged these results.   Electronically Signed   By: Markus Daft M.D.   On: 09/23/2014 13:32   Ct Abdomen Pelvis W Contrast  09/23/2014   CLINICAL DATA:  Right-sided abdominal pain.  Vomiting.  Jaundice.  EXAM: CT ABDOMEN AND PELVIS WITH CONTRAST  TECHNIQUE: Multidetector CT imaging of the abdomen and pelvis was performed using the standard protocol following bolus administration of intravenous contrast.  CONTRAST:  56mL OMNIPAQUE IOHEXOL 300 MG/ML SOLN, 130mL OMNIPAQUE IOHEXOL 300 MG/ML SOLN  COMPARISON:  Ultrasound 09/23/2014.  CT chest 09/06/2010.  FINDINGS: Diffuse fatty infiltration of the liver. Focal area of low density noted in the anterior aspect of the liver adjacent to the falciform ligament is noted. This most likely represents an area of focal fatty infiltration or benign lesion such as hemangioma. Similar finding was noted on prior see CT chest study of 09/06/2010 but is better demonstrated on today's abdominal exam. Spleen is normal. Nonocclusive splenic vein thrombosis is noted. The portal vein patent. Hepatic veins are patent.  Adrenals are unremarkable. Tiny lucencies in the kidneys, most likely simple cysts. No hydronephrosis. No evidence of obstructing ureteral stone. Bladder is nondistended. Mild bladder wall thickening noted. This could be related to bladder wall pathology. This may be related to lack of distention of the bladder. Uterus is prominent. Bilateral tubal ligations. Small amount of free pelvic fluid. This is nonspecific.  No significant adenopathy. Abdominal aorta normal caliber. The visceral vessels are patent.  Appendix is normal. No evidence of bowel distention. No free air. Stomach is nondistended. No mesenteric mass. No significant abdominal wall hernia.  Heart  size normal. Lung bases clear of infiltrates. Mild basilar atelectasis. No acute bony abnormality.  IMPRESSION: 1. Partial thrombosis of the splenic vein as noted on prior ultrasound. Portal vein and hepatic veins are patent. 2. Mild bladder wall thickening. This may be related to nondistended state. Bladder wall pathology including cystitis cannot be excluded .   Electronically Signed   By: Marcello Moores  Register   On: 09/23/2014 16:35    Assessment/Plan Present on Admission:  . Acute hepatitis . Splenic vein thrombosis . Alcohol withdrawal  #1 partial  splenic vein thrombosis - in the setting of antiphospholipid syndrome Will admit patient to medical floor and start the patient on Lovenox at treatment doses and start the patient on Coumadin. Will treat the patient with Percocet for her pain, as well as antiemetics for nausea. We'll consult GI in the morning.  Patient currently does not have a primary care physician. She will need one for chronic management of her anticoagulation needs.  #2 acute hepatitis Possible alcoholic. Will rule out viral as well as HIV. Of note, she has had positive screening test for HIV in the past, however the confirmation test ruled this out  #3 alcohol withdrawal We'll place the patient on CIWA protocol.  DVT prophylaxis: Treatment dose of Lovenox and Coumadin  Consultants: GI in the morning  Code Status: Full  Family Communication: None   Disposition Plan: Home following treatment  Time spent: 70 minutes  Loma Boston, DO [facility] Pager [phone]  **Disclaimer: This note may have been dictated with voice recognition software. Similar sounding words can inadvertently be transcribed and this note may contain transcription errors which may not have been corrected upon publication of note.**

## 2014-09-23 NOTE — ED Notes (Signed)
Dr Knapp at bedside,  

## 2014-09-23 NOTE — ED Notes (Signed)
Pt c/o lower back pain, epigastric pain with n/v that started this am, actively vomiting at triage, pt hyperventilating upon arrival to tx room, c/o numbness to face, bilateral hands, comfort measures provided, iv started, zofran given per protocol

## 2014-09-23 NOTE — ED Notes (Signed)
Lab called and advised that pt's tylenol level would have to be sent out due to not being able to be read on machine at [facility], Dr Tomi Bamberger notified, advised turn around time would be several hours,

## 2014-09-23 NOTE — Progress Notes (Addendum)
Communicated to patient regarding assistance with medication for diagnosis. Asked was ok to communicate while patient has someone in room. Patient was ok with talking.  Once proceeding with communicating about [employer] program assistance for antiviral medications. Patient  stated had diagnosis of Lupus. CM changed conversation regarding medication and program and diagnosis. Reported the situation to charge nurse and staff nurse. Received information that patient reported to the nurses initially of the HIV diagnosis. Paient to be admitted and will need to be followed up by case management. Patient Epic record note shows EDP on 09/23/14 Western Blot test negative.  Dr. Tomi Bamberger was able to communicate to patient regarding the miscommunication of the diagnosis. Patient is better understanding of what happened.

## 2014-09-23 NOTE — ED Notes (Signed)
Attempted to call report to [facility] at [facility], nurse to call back.

## 2014-09-23 NOTE — ED Notes (Addendum)
Pt states severe, generalized abdominal pain began suddenly at 0930 along with vomiting. Pt also states eyes began turning yellow 2-3 days ago.

## 2014-09-23 NOTE — ED Notes (Signed)
Pt is going to be admitted to [facility] not [facility].

## 2014-09-23 NOTE — ED Notes (Signed)
Pt states that the pain and nausea are starting to return, Dr Tomi Bamberger notified,
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
## 2014-09-23 NOTE — Progress Notes (Signed)
ANTICOAGULATION CONSULT NOTE - Initial Consult  Pharmacy Consult for Coumadin Indication: Splenic vein partial thrombosis                     Lupus anticoagulant disorder  No Known Allergies  Patient Measurements: Height: 5\' 7"  (170.2 cm) Weight: 160 lb (72.576 kg) IBW/kg (Calculated) : 61.6 Heparin Dosing Weight:   Vital Signs: Temp: 98.7 F (37.1 C) (09/27 2015) Temp src: Oral (09/27 2015) BP: 108/88 mmHg (09/27 2015) Pulse Rate: 80 (09/27 2015)  Labs:  Recent Labs  09/23/14 1020 09/23/14 1721  HGB 12.0  --   HCT 35.3*  --   PLT 544*  --   APTT  --  29  LABPROT  --  15.0  INR  --  1.18  CREATININE 0.67  --     Estimated Creatinine Clearance: 105.4 ml/min (by C-G formula based on Cr of 0.67).   Medical History: Past Medical History  Diagnosis Date  . Pulmonary embolism 06/2010    Multiple bilateral pulmonary emboli diagnosed by CT scan in in 06/2010.  Moderate size pleural effusion and right lower lobe infarction present at that time.  Lupus anticoagulant was present, but all other aspects of her hypercoagulable evaluation were negative including all Cardiolite and antibodies.    . Anticoagulant long-term use   . Lupus anticoagulant disorder     Previously followed by Dr. Lieutenant Diego of discharged from his care due to medical noncompliance.  Marland Kitchen HIV positive     Western Blot negative  . Tobacco abuse   . Anemia   . Substance abuse     cocaine, opiates, marijuana; alcohol; most recent positive test for cocaine was in December of 2011; alcohol levels have been as high as 302  . Anxiety and depression     suicide attempt-age 77  . Attention deficit hyperactivity disorder   . Asthma     ABG in 11/2010:7.45, 34, 79., ventolin rescue inhaler last use 03/21/12  . Anxiety   . Panic attacks     not on meds  . Bipolar disorder     not on meds  . Abnormal Pap smear   . GERD (gastroesophageal reflux disease)   . Recurrent upper respiratory infection (URI)   .  Cough productive of purulent sputum   . Edema in pregnancy   . Shortness of breath     has asthma- scheduled for PFTs on 04/06/12  . Dermatitis due to detergents   . Antiphospholipid antibody with hypercoagulable state   . Depression     suicide attempt at age 68    Medications:  Scheduled:  . sodium chloride  1,000 mL Intravenous Once  . enoxaparin (LOVENOX) injection  1.5 mg/kg Subcutaneous Q24H  . folic acid  1 mg Oral Daily  . [START ON 09/24/2014] Influenza vac split quadrivalent PF  0.5 mL Intramuscular Tomorrow-1000  . multivitamin with minerals  1 tablet Oral Daily  . [START ON 09/24/2014] pneumococcal 23 valent vaccine  0.5 mL Intramuscular Tomorrow-1000  . sodium chloride      . thiamine  100 mg Oral Daily   Or  . thiamine  100 mg Intravenous Daily  . warfarin  7.5 mg Oral Once  . [START ON 09/24/2014] Warfarin - Pharmacist Dosing Inpatient   Does not apply q1800    Assessment: Lupus anticoagulant disorder, splenic vein partial thrombosis Lovenox treatment dosage started by MD Coumadin per pharmacy protocol Labs reviewed  Goal of Therapy:  INR 2-3 Monitor platelets  by anticoagulation protocol: Yes   Plan:  Coumadin 7.5 mg po x 1 dose tonight INR/PT daily Continue Lovenox until INR therapeutic Monitor CBC , platelets   [name], [name] 09/23/2014,8:28 PM

## 2014-09-24 ENCOUNTER — Encounter (HOSPITAL_COMMUNITY): Payer: Self-pay | Admitting: Gastroenterology

## 2014-09-24 DIAGNOSIS — F32A Depression, unspecified: Secondary | ICD-10-CM | POA: Diagnosis present

## 2014-09-24 DIAGNOSIS — F1010 Alcohol abuse, uncomplicated: Secondary | ICD-10-CM

## 2014-09-24 DIAGNOSIS — F329 Major depressive disorder, single episode, unspecified: Secondary | ICD-10-CM | POA: Diagnosis present

## 2014-09-24 DIAGNOSIS — D649 Anemia, unspecified: Secondary | ICD-10-CM | POA: Diagnosis present

## 2014-09-24 DIAGNOSIS — R17 Unspecified jaundice: Secondary | ICD-10-CM

## 2014-09-24 DIAGNOSIS — E876 Hypokalemia: Secondary | ICD-10-CM | POA: Diagnosis present

## 2014-09-24 DIAGNOSIS — F1910 Other psychoactive substance abuse, uncomplicated: Secondary | ICD-10-CM

## 2014-09-24 DIAGNOSIS — K219 Gastro-esophageal reflux disease without esophagitis: Secondary | ICD-10-CM | POA: Diagnosis present

## 2014-09-24 DIAGNOSIS — D6859 Other primary thrombophilia: Secondary | ICD-10-CM

## 2014-09-24 DIAGNOSIS — D7389 Other diseases of spleen: Secondary | ICD-10-CM

## 2014-09-24 DIAGNOSIS — K7200 Acute and subacute hepatic failure without coma: Secondary | ICD-10-CM

## 2014-09-24 DIAGNOSIS — F319 Bipolar disorder, unspecified: Secondary | ICD-10-CM | POA: Diagnosis present

## 2014-09-24 DIAGNOSIS — R1013 Epigastric pain: Secondary | ICD-10-CM

## 2014-09-24 DIAGNOSIS — R195 Other fecal abnormalities: Secondary | ICD-10-CM

## 2014-09-24 LAB — COMPREHENSIVE METABOLIC PANEL
ALK PHOS: 284 U/L — AB (ref 39–117)
ALT: 326 U/L — AB (ref 0–35)
AST: 510 U/L — AB (ref 0–37)
Albumin: 2.8 g/dL — ABNORMAL LOW (ref 3.5–5.2)
Anion gap: 13 (ref 5–15)
BILIRUBIN TOTAL: 8.4 mg/dL — AB (ref 0.3–1.2)
BUN: 7 mg/dL (ref 6–23)
CHLORIDE: 99 meq/L (ref 96–112)
CO2: 28 meq/L (ref 19–32)
Calcium: 8.2 mg/dL — ABNORMAL LOW (ref 8.4–10.5)
Creatinine, Ser: 0.78 mg/dL (ref 0.50–1.10)
GLUCOSE: 78 mg/dL (ref 70–99)
POTASSIUM: 2.9 meq/L — AB (ref 3.7–5.3)
SODIUM: 140 meq/L (ref 137–147)
Total Protein: 6.6 g/dL (ref 6.0–8.3)

## 2014-09-24 LAB — CBC
HCT: 30.1 % — ABNORMAL LOW (ref 36.0–46.0)
Hemoglobin: 9.8 g/dL — ABNORMAL LOW (ref 12.0–15.0)
MCH: 27.2 pg (ref 26.0–34.0)
MCHC: 32.6 g/dL (ref 30.0–36.0)
MCV: 83.6 fL (ref 78.0–100.0)
PLATELETS: 429 10*3/uL — AB (ref 150–400)
RBC: 3.6 MIL/uL — AB (ref 3.87–5.11)
RDW: 21.5 % — ABNORMAL HIGH (ref 11.5–15.5)
WBC: 5.9 10*3/uL (ref 4.0–10.5)

## 2014-09-24 LAB — VITAMIN B12: Vitamin B-12: 1077 pg/mL — ABNORMAL HIGH (ref 211–911)

## 2014-09-24 LAB — HEPATITIS PANEL, ACUTE
HCV Ab: REACTIVE — AB
HEP B C IGM: NONREACTIVE
Hep A IgM: NONREACTIVE
Hepatitis B Surface Ag: NEGATIVE

## 2014-09-24 LAB — FOLATE

## 2014-09-24 LAB — PROTIME-INR
INR: 1.29 (ref 0.00–1.49)
PROTHROMBIN TIME: 16.1 s — AB (ref 11.6–15.2)

## 2014-09-24 LAB — IRON AND TIBC
Iron: 251 ug/dL — ABNORMAL HIGH (ref 42–135)
Saturation Ratios: 75 % — ABNORMAL HIGH (ref 20–55)
TIBC: 336 ug/dL (ref 250–470)
UIBC: 85 ug/dL — ABNORMAL LOW (ref 125–400)

## 2014-09-24 LAB — FERRITIN: FERRITIN: 256 ng/mL (ref 10–291)

## 2014-09-24 LAB — HIV ANTIBODY (ROUTINE TESTING W REFLEX): HIV 1&2 Ab, 4th Generation: NONREACTIVE

## 2014-09-24 LAB — RETICULOCYTES
RBC.: 3.27 MIL/uL — AB (ref 3.87–5.11)
RETIC COUNT ABSOLUTE: 107.9 10*3/uL (ref 19.0–186.0)
Retic Ct Pct: 3.3 % — ABNORMAL HIGH (ref 0.4–3.1)

## 2014-09-24 MED ORDER — WARFARIN - PHARMACIST DOSING INPATIENT
Status: DC
  Administered 2014-09-25: 1
  Administered 2014-09-26: 16:00:00

## 2014-09-24 MED ORDER — WARFARIN SODIUM 5 MG PO TABS
7.5000 mg | ORAL_TABLET | Freq: Once | ORAL | Status: AC
  Administered 2014-09-24: 7.5 mg via ORAL
  Filled 2014-09-24: qty 2

## 2014-09-24 MED ORDER — DIPHENHYDRAMINE HCL 50 MG/ML IJ SOLN
12.5000 mg | Freq: Four times a day (QID) | INTRAMUSCULAR | Status: DC | PRN
  Administered 2014-09-24 – 2014-09-26 (×5): 12.5 mg via INTRAVENOUS
  Filled 2014-09-24 (×5): qty 1

## 2014-09-24 MED ORDER — POTASSIUM CHLORIDE CRYS ER 20 MEQ PO TBCR
40.0000 meq | EXTENDED_RELEASE_TABLET | ORAL | Status: AC
  Administered 2014-09-24 (×2): 40 meq via ORAL
  Filled 2014-09-24 (×2): qty 2

## 2014-09-24 MED ORDER — WARFARIN VIDEO
Freq: Once | Status: AC
  Administered 2014-09-24: 1

## 2014-09-24 MED ORDER — PANTOPRAZOLE SODIUM 40 MG PO TBEC
40.0000 mg | DELAYED_RELEASE_TABLET | Freq: Every day | ORAL | Status: DC
  Administered 2014-09-24 – 2014-09-27 (×4): 40 mg via ORAL
  Filled 2014-09-24 (×4): qty 1

## 2014-09-24 MED ORDER — ONDANSETRON HCL 4 MG/2ML IJ SOLN
4.0000 mg | Freq: Four times a day (QID) | INTRAMUSCULAR | Status: DC | PRN
  Administered 2014-09-24 – 2014-09-27 (×9): 4 mg via INTRAVENOUS
  Filled 2014-09-24 (×9): qty 2

## 2014-09-24 MED ORDER — NICOTINE 21 MG/24HR TD PT24
21.0000 mg | MEDICATED_PATCH | Freq: Every day | TRANSDERMAL | Status: DC
  Administered 2014-09-24 – 2014-09-27 (×4): 21 mg via TRANSDERMAL
  Filled 2014-09-24 (×4): qty 1

## 2014-09-24 MED ORDER — COUMADIN BOOK
Freq: Once | Status: AC
Start: 2014-09-24 — End: 2014-09-24
  Administered 2014-09-24: 1
  Filled 2014-09-24: qty 1

## 2014-09-24 NOTE — Progress Notes (Signed)
CRITICAL VALUE ALERT  Critical value received:  Potassium 2.9  Date of notification:  09/24/2014  Time of notification 0740  Critical value read back:yes  Nurse who received alert:  Jomarie Longs  MD notified (1st page):  On floor and notified  Time of first page:  0750  MD notified (2nd page):  Time of second page:  Responding MD:  Dr. Roderic Palau  Time MD responded:  [phone]

## 2014-09-24 NOTE — Consult Note (Signed)
Referring Provider: Dr. Nehemiah Settle Primary Care Physician:  Dr. Legrand Rams Primary Gastroenterologist:  Dr. Gala Romney   Date of Admission: 09/23/14 Date of Consultation: 09/24/14  Reason for Consultation:  Elevated LFTs  HPI:  [name] is a 23 year old female with a history of antiphospholipid antibody syndrome, not taking anticoagulation as prescribed , presenting with abdominal pain, nausea, and vomiting, elevated LFTs, acute hepatitis panel with +HCV antibody. CT with partial thrombosis of splenic vein with portal and hepatic veins patent. Drug screen positive for marijuana.   Noted pain in RUQ/LUQ, radiating to back. States eyes were yellow. N/V associated with pain. Noticed jaundice about 3-5 days ago. No prior episodes. No overt signs of GI bleeding. Stool like "pure water" since yesterday. 2 loose stools yesterday, 2 today. No fever/chills. 4-8 glasses of wine a day. +marijuana, denies IV drug use but has used cocaine intranasally. +HCV antibody on recent hepatitis panel. Has several tattoos that she did with her own instruments at home.   Abdominal pain improved. Not much appetite this morning. Still with underlying nausea but no vomiting. Tearful when told about Hep C.    Past Medical History  Diagnosis Date  . Pulmonary embolism 06/2010    Multiple bilateral pulmonary emboli diagnosed by CT scan in in 06/2010.  Moderate size pleural effusion and right lower lobe infarction present at that time.  Lupus anticoagulant was present, but all other aspects of her hypercoagulable evaluation were negative including all Cardiolite and antibodies.    . Anticoagulant long-term use   . Lupus anticoagulant disorder     Previously followed by Dr. Lieutenant Diego of discharged from his care due to medical noncompliance.  Marland Kitchen HIV positive     Western Blot negative  . Tobacco abuse   . Anemia   . Substance abuse     cocaine, opiates, marijuana; alcohol; most recent positive test for cocaine was  in December of 2011; alcohol levels have been as high as 302  . Anxiety and depression     suicide attempt-age 76  . Attention deficit hyperactivity disorder   . Asthma     ABG in 11/2010:7.45, 34, 79., ventolin rescue inhaler last use 03/21/12  . Anxiety   . Panic attacks     not on meds  . Bipolar disorder     not on meds  . Abnormal Pap smear   . GERD (gastroesophageal reflux disease)   . Recurrent upper respiratory infection (URI)   . Cough productive of purulent sputum   . Edema in pregnancy   . Shortness of breath     has asthma- scheduled for PFTs on 04/06/12  . Dermatitis due to detergents   . Antiphospholipid antibody with hypercoagulable state   . Depression     suicide attempt at age 43    Past Surgical History  Procedure Laterality Date  . Cesarean section  2011, 2009  . Wrist surgery  2009    hemoangioma removed from right wrist  . Cesarean section  04/08/2012    Procedure: CESAREAN SECTION;  Surgeon: Donnamae Jude, MD;  Location: [facility];  Service: Gynecology;  Laterality: N/A;  . Tubal ligation  04/08/2012    Procedure: BILATERAL TUBAL LIGATION;  Surgeon: Donnamae Jude, MD;  Location: [facility];  Service: Gynecology;  Laterality: Bilateral;  . Hemangioma excision  2013    R wrist    Prior to Admission medications   Not on File    Current Facility-Administered Medications  Medication  Dose Route Frequency Provider Last Rate Last Dose  . 0.9 %  sodium chloride infusion  1,000 mL Intravenous Once Janice Norrie, MD   1,000 mL at 09/23/14 1113   Followed by  . 0.9 %  sodium chloride infusion  1,000 mL Intravenous Continuous Janice Norrie, MD      . 0.9 %  sodium chloride infusion   Intravenous Continuous Janice Norrie, MD 100 mL/hr at 09/24/14 0535    . acetaminophen (TYLENOL) tablet 650 mg  650 mg Oral Q6H PRN Truett Mainland, DO       Or  . acetaminophen (TYLENOL) suppository 650 mg  650 mg Rectal Q6H PRN Tanna Savoy Stinson, DO      . alum & mag hydroxide-simeth  (MAALOX/MYLANTA) 200-200-20 MG/5ML suspension 30 mL  30 mL Oral Q6H PRN Truett Mainland, DO      . coumadin book   Does not apply Once Kathie Dike, MD      . docusate sodium (COLACE) capsule 100 mg  100 mg Oral Daily PRN Tanna Savoy Stinson, DO      . enoxaparin (LOVENOX) injection 110 mg  1.5 mg/kg Subcutaneous Q24H Tanna Savoy Stinson, DO   110 mg at 09/23/14 2059  . folic acid (FOLVITE) tablet 1 mg  1 mg Oral Daily Tanna Savoy Stinson, DO   1 mg at 09/24/14 0865  . Influenza vac split quadrivalent PF (FLUARIX) injection 0.5 mL  0.5 mL Intramuscular Tomorrow-1000 Rosita Fire, MD      . LORazepam (ATIVAN) tablet 1 mg  1 mg Oral Q6H PRN Tanna Savoy Stinson, DO   1 mg at 09/24/14 0305   Or  . LORazepam (ATIVAN) injection 1 mg  1 mg Intravenous Q6H PRN Truett Mainland, DO      . multivitamin with minerals tablet 1 tablet  1 tablet Oral Daily Truett Mainland, DO   1 tablet at 09/24/14 7846  . oxyCODONE (Oxy IR/ROXICODONE) immediate release tablet 10 mg  10 mg Oral Q6H PRN Tanna Savoy Stinson, DO   10 mg at 09/24/14 9629  . pneumococcal 23 valent vaccine (PNU-IMMUNE) injection 0.5 mL  0.5 mL Intramuscular Tomorrow-1000 Rosita Fire, MD      . thiamine (VITAMIN B-1) tablet 100 mg  100 mg Oral Daily Tanna Savoy Stinson, DO   100 mg at 09/24/14 5284   Or  . thiamine (B-1) injection 100 mg  100 mg Intravenous Daily Tanna Savoy Stinson, DO      . warfarin (COUMADIN) tablet 7.5 mg  7.5 mg Oral Once Kathie Dike, MD      . Margrett Rud warfarin (COUMADIN) video   Does not apply Once Kathie Dike, MD   1 each at 09/24/14 1200  . [START ON 09/25/2014] Warfarin - Pharmacist Dosing Inpatient   Does not apply Q24H Kathie Dike, MD        Allergies as of 09/23/2014  . (No Known Allergies)    Family History  Problem Relation Age of Onset  . Drug abuse Mother     History   . Depression Mother   . Drug abuse Father   . Heart attack Father 74    H/o drug abuse  . Anesthesia problems Neg Hx   . Hypotension Neg Hx   .  Malignant hyperthermia Neg Hx   . Pseudochol deficiency Neg Hx   . Depression Sister   . Depression Maternal Grandmother   . Diabetes Maternal Grandfather   . Colon cancer Neg  Hx     History   Social History  . Marital Status: Single    Spouse Name: N/A    Number of Children: 3  . Years of Education: N/A   Occupational History  . unemployed    Social History Main Topics  . Smoking status: Former Smoker -- 0.50 packs/day    Types: Cigarettes    Quit date: 08/28/2014  . Smokeless tobacco: Never Used  . Alcohol Use: Yes     Comment: 4-8 glasses of wine daily  . Drug Use: Yes    Special: Marijuana, Other-see comments     Comment: former drug abuse, cocaine about 5 years ago but denies IV drug abuse  . Sexual Activity: Yes    Birth Control/ Protection: None   Other Topics Concern  . Not on file   Social History Narrative  . No narrative on file    Review of Systems: As mentioned in HPI  Physical Exam: Vital signs in last 24 hours: Temp:  [97.6 F (36.4 C)-98.7 F (37.1 C)] 97.6 F (36.4 C) (09/28 0623) Pulse Rate:  [71-99] 71 (09/28 0623) Resp:  [11-18] 18 (09/28 0623) BP: (105-137)/(53-96) 117/53 mmHg (09/28 0623) SpO2:  [98 %-100 %] 100 % (09/28 0623) Weight:  [160 lb (72.576 kg)] 160 lb (72.576 kg) (09/27 1814) Last BM Date: 09/23/14 General:   Alert,  Jaundiced, tearful.  Head:  Normocephalic and atraumatic. Eyes:  +scleral icterus Ears:  Normal auditory acuity. Nose:  No deformity, discharge,  or lesions. Mouth:  No deformity or lesions Lungs:  Clear throughout to auscultation.   No wheezes, crackles, or rhonchi. No acute distress. Heart:  S1 S2 present; no murmurs, clicks, rubs,  or gallops. Abdomen:  Soft, TTP RUQ and nondistended. No masses, hepatosplenomegaly or hernias noted. Normal bowel sounds, without guarding, and without rebound.   Rectal:  Deferred  Msk:  Symmetrical without gross deformities. Normal posture. Extremities:  Without  edema. Neurologic:  Alert and  oriented x4;  grossly normal neurologically. Skin:  Jaundiced Psych:  Alert and cooperative. Normal mood and affect.  Intake/Output from previous day: 09/27 0701 - 09/28 0700 In: 1003.3 [I.V.:1003.3] Out: 200 [Urine:200] Intake/Output this shift:    Lab Results:  Recent Labs  09/23/14 1020 09/24/14 0538  WBC 7.5 5.9  HGB 12.0 9.8*  HCT 35.3* 30.1*  PLT 544* 429*   BMET  Recent Labs  09/23/14 1020 09/24/14 0538  NA 135* 140  K 3.2* 2.9*  CL 92* 99  CO2 23 28  GLUCOSE 103* 78  BUN 7 7  CREATININE 0.67 0.78  CALCIUM 9.3 8.2*   LFT  Recent Labs  09/23/14 1020 09/23/14 1953 09/24/14 0538  PROT 8.5*  --  6.6  ALBUMIN 3.8  --  2.8*  AST 925*  --  510*  ALT 500*  --  326*  ALKPHOS 397*  --  284*  BILITOT 10.7*  --  8.4*  BILIDIR  --  5.0*  --    PT/INR  Recent Labs  09/23/14 1721 09/24/14 0538  LABPROT 15.0 16.1*  INR 1.18 1.29   Hepatitis Panel  Recent Labs  09/23/14 1239  HEPBSAG NEGATIVE  HCVAB Reactive*  HEPAIGM NON REACTIVE  HEPBIGM NON REACTIVE   INR on admission: 1.18 9/28: 1.29   US Abdomen Complete  09/23/2014   CLINICAL DATA:  Right upper quadrant pain. Acute onset of jaundice. History of lupus.  EXAM: ULTRASOUND ABDOMEN COMPLETE  COMPARISON:  11/23/2005  FINDINGS: Gallbladder:  Echogenic  structure along the gallbladder wall measures 0.5 cm. This structure is non mobile and suggestive for a gallbladder polyp. No definite gallstones. Reportedly, the patient has a sonographic Murphy's sign.  Common bile duct:  Diameter: 0.4 cm  Liver:  Liver parenchyma is within normal limits. The main portal vein is patent. No focal liver lesion.  IVC:  No abnormality visualized.  Pancreas:  There is echogenic material within the splenic vein near the portal confluence. Findings are suggestive for venous thrombus. The adjacent pancreas is mildly heterogeneous without duct dilatation.  Spleen:  Measures roughly 10.1 cm in  length and the splenic volume is 330 mL.  Right Kidney:  Length: 11.5 cm. No evidence hydronephrosis. There are echogenic foci in the lower pole which could represent stones. In addition, there is a round hypoechoic structure in the right kidney that roughly measures 1.1 cm and probably represents a cyst.  Left Kidney:  Length: 11.9 cm. Echogenicity within normal limits. No mass or hydronephrosis visualized.  Abdominal aorta:  No aneurysm visualized.  Other findings:  None.  IMPRESSION: Echogenic material in the splenic vein near the portal vein confluence. Findings are concerning for venous thrombus. Recommend further evaluation with a CT with intravenous contrast.  Gallbladder polyp measuring 0.5 cm.  Probable right renal cyst.  Echogenic foci in the right kidney could represent kidney stones. Negative for hydronephrosis.  These results were called by telephone at the time of interpretation on 09/23/2014 at 1:30 pm to Dr. Rolland Porter , who verbally acknowledged these results.   Electronically Signed   By: Markus Daft M.D.   On: 09/23/2014 13:32   Ct Abdomen Pelvis W Contrast  09/23/2014   CLINICAL DATA:  Right-sided abdominal pain.  Vomiting.  Jaundice.  EXAM: CT ABDOMEN AND PELVIS WITH CONTRAST  TECHNIQUE: Multidetector CT imaging of the abdomen and pelvis was performed using the standard protocol following bolus administration of intravenous contrast.  CONTRAST:  2mL OMNIPAQUE IOHEXOL 300 MG/ML SOLN, 127mL OMNIPAQUE IOHEXOL 300 MG/ML SOLN  COMPARISON:  Ultrasound 09/23/2014.  CT chest 09/06/2010.  FINDINGS: Diffuse fatty infiltration of the liver. Focal area of low density noted in the anterior aspect of the liver adjacent to the falciform ligament is noted. This most likely represents an area of focal fatty infiltration or benign lesion such as hemangioma. Similar finding was noted on prior see CT chest study of 09/06/2010 but is better demonstrated on today's abdominal exam. Spleen is normal. Nonocclusive  splenic vein thrombosis is noted. The portal vein patent. Hepatic veins are patent.  Adrenals are unremarkable. Tiny lucencies in the kidneys, most likely simple cysts. No hydronephrosis. No evidence of obstructing ureteral stone. Bladder is nondistended. Mild bladder wall thickening noted. This could be related to bladder wall pathology. This may be related to lack of distention of the bladder. Uterus is prominent. Bilateral tubal ligations. Small amount of free pelvic fluid. This is nonspecific.  No significant adenopathy. Abdominal aorta normal caliber. The visceral vessels are patent.  Appendix is normal. No evidence of bowel distention. No free air. Stomach is nondistended. No mesenteric mass. No significant abdominal wall hernia.  Heart size normal. Lung bases clear of infiltrates. Mild basilar atelectasis. No acute bony abnormality.  IMPRESSION: 1. Partial thrombosis of the splenic vein as noted on prior ultrasound. Portal vein and hepatic veins are patent. 2. Mild bladder wall thickening. This may be related to nondistended state. Bladder wall pathology including cystitis cannot be excluded .   Electronically Signed   By: Marcello Moores  Register   On: 09/23/2014 16:35    Impression: 23 year old female admitted with acute hepatitis in the setting of ETOH abuse and also noted to be Hep C antibody positive. Discriminant function remains less than 30, and LFTs improving since admission. As of note, history of HIV antibody positive in past with subsequent negative confirmation test. Supportive measures indicated at this time. Needs HCV RNA with reflex genotype. Discussed alcohol cessation with patient, including need for alcohol and drug cessation for at least 6 months prior to treatment for Hepatitis C. If found to have co-infection with HIV, would refer to Infectious Disease in [facility] for consideration of treatment.   Anemia: no overt signs of GI bleeding. Multifactorial. Anemia panel ordered.   Splenic  vein thrombosis: with known antiphospholipid syndrome. Anticoagulation per pharmacy.   Diarrhea: check stool studies. Doubt infectious process.   Plan: Add PPI for GI prophylaxis Hep C RNA with reflex genotype Follow LFTs to baseline Anemia panel Check stool studies Supportive care  Orvil Feil, ANP-BC [facility]     LOS: 1 day    09/24/2014, 10:08 AM  Attending note:  Patient seen and examined. I discussed the possibility of chronic HCV infection with the patient along with available treatments. Importance of alcohol cessation also reviewed with her and her fianc. Further recommendations to follow once above labs are available for review.

## 2014-09-24 NOTE — Clinical Social Work Psychosocial (Signed)
Clinical Social Work Department BRIEF PSYCHOSOCIAL ASSESSMENT 09/24/2014  Patient:  [name], [name]     Account Number:  [id number]     Admit date:  09/23/2014  Clinical Social Worker:  Wyatt Haste  Date/Time:  09/24/2014 03:21 PM  Referred by:  Physician  Date Referred:  09/24/2014 Referred for  Substance Abuse   Other Referral:   Interview type:  Patient Other interview type:    PSYCHOSOCIAL DATA Living Status:  SIGNIFICANT OTHER Admitted from facility:   Level of care:   Primary support name:  Randall Hiss Primary support relationship to patient:  FRIEND Degree of support available:   supportive per pt    CURRENT CONCERNS Current Concerns  Substance Abuse   Other Concerns:    SOCIAL WORK ASSESSMENT / PLAN CSW met with pt at bedside following referral from MD for substance use. Pt alert and oriented and very open to talking to CSW. She reports she lives in an apartment with her boyfriend, Randall Hiss and also describes him as her best support. Pt is not working and is considering applying for disability. She has three children under the age of 26 but does not have custody of them. Pt came to ED with abdominal pain. She states she has been diagnosed with a blood clot and hepatitis. Pt is very scared about hepatitis diagnosis and has shared this with Randall Hiss. Support provided.    Pt admits that she started drinking and using drugs at the age of 37. Last cocaine use was 5 years ago by her report. She states she smokes marijuana about once a week. Pt reports this is for nausea and anxiety. She admits to marijuana use during her pregnancies as well. Pt reports drinking about 4 glasses of wine daily. She does not feel that she has a substance abuse problem, but is ready to quit after this hospitalization. She has been sober a few times in the past for up to a year, including pregnancy. Pt went to inpatient rehab, but relapsed right after. She did not find this helpful. When pt was sober, she states  she stopped on her own. Pt feels she is ready to stop now. She thinks she will have no problem stopping on her own and Randall Hiss does not drink regularly, so he will help hold her accountable. Pt also may need to distance herself from neighbors who drink and she is willing to do this if needed.    Pt has diagnosis of Bipolar disorder and history of suicide attempt. She reports she was diagnosed as a teenager but is not on medication as she does not feel it is needed. She has been in therapy in the past and said it was helpful. Pt denies that she had a suicide attempt, stating that she and a friend went in to the bathroom at school and took pills and ended up passing out, but it was not intentional.    CSW discussed treatment options with pt and she is open to outpatient counseling as well as AA. Resources given to pt. She reports she has transportation to get to meetings if needed. Pt reports no other needs at this time.   Assessment/plan status:  Referral to [employer] Other assessment/ plan:   Information/referral to community resources:   AA meeting schedule  Outpatient counseling list  Effects of Drinking  [employer] guide    PATIENT'S/FAMILY'S RESPONSE TO PLAN OF CARE: Pt very willing to discuss substance abuse and mental health. She feels she can stop drinking  on her own, but is willing to consider counseling/AA if necessary. CSW encouraged pt to follow up with referrals.       Benay Pike, [place]

## 2014-09-24 NOTE — Progress Notes (Signed)
Patient seen and examined. Note reviewed.  Patient is seen lying in bed. She appears comfortable. Abdomen is diffusely tender. Bowel sounds are active.  Patient is admitted with abdominal pain. She is found to have partial splenic vein thrombosis. She has a history of antiphospholipid antibody syndrome and has had PEs and DVTs in the past. Patient has been noncompliant with Coumadin. She's been started on anticoagulation. GI consult appreciated. She does have elevated LFTs which could be from alcohol versus HCV infection. Currently continue supportive therapy for dehydration. She will need to be started on Coumadin/Lovenox in the hospital. She's not a candidate for any of the novel anticoagulants since she does not have any insurance. We'll need to follow INR until therapeutic.  MEMON,JEHANZEB

## 2014-09-24 NOTE — Progress Notes (Signed)
[facility] for Coumadin Indication: Splenic vein partial thrombosis                     Lupus anticoagulant disorder  No Known Allergies  Patient Measurements: Height: 5\' 7"  (170.2 cm) Weight: 160 lb (72.576 kg) IBW/kg (Calculated) : 61.6 Heparin Dosing Weight:   Vital Signs: Temp: 97.6 F (36.4 C) (09/28 0623) Temp src: Oral (09/28 0623) BP: 117/53 mmHg (09/28 0623) Pulse Rate: 71 (09/28 0623)  Labs:  Recent Labs  09/23/14 1020 09/23/14 1721 09/24/14 0538  HGB 12.0  --  9.8*  HCT 35.3*  --  30.1*  PLT 544*  --  429*  APTT  --  29  --   LABPROT  --  15.0 16.1*  INR  --  1.18 1.29  CREATININE 0.67  --  0.78   Estimated Creatinine Clearance: 105.4 ml/min (by C-G formula based on Cr of 0.78).  Medical History: Past Medical History  Diagnosis Date  . Pulmonary embolism 06/2010    Multiple bilateral pulmonary emboli diagnosed by CT scan in in 06/2010.  Moderate size pleural effusion and right lower lobe infarction present at that time.  Lupus anticoagulant was present, but all other aspects of her hypercoagulable evaluation were negative including all Cardiolite and antibodies.    . Anticoagulant long-term use   . Lupus anticoagulant disorder     Previously followed by Dr. Lieutenant Diego of discharged from his care due to medical noncompliance.  Marland Kitchen HIV positive     Western Blot negative  . Tobacco abuse   . Anemia   . Substance abuse     cocaine, opiates, marijuana; alcohol; most recent positive test for cocaine was in December of 2011; alcohol levels have been as high as 302  . Anxiety and depression     suicide attempt-age 23  . Attention deficit hyperactivity disorder   . Asthma     ABG in 11/2010:7.45, 34, 79., ventolin rescue inhaler last use 03/21/12  . Anxiety   . Panic attacks     not on meds  . Bipolar disorder     not on meds  . Abnormal Pap smear   . GERD (gastroesophageal reflux disease)   . Recurrent upper  respiratory infection (URI)   . Cough productive of purulent sputum   . Edema in pregnancy   . Shortness of breath     has asthma- scheduled for PFTs on 04/06/12  . Dermatitis due to detergents   . Antiphospholipid antibody with hypercoagulable state   . Depression     suicide attempt at age 45   Medications:  Scheduled:  . sodium chloride  1,000 mL Intravenous Once  . enoxaparin (LOVENOX) injection  1.5 mg/kg Subcutaneous Q24H  . folic acid  1 mg Oral Daily  . Influenza vac split quadrivalent PF  0.5 mL Intramuscular Tomorrow-1000  . multivitamin with minerals  1 tablet Oral Daily  . pneumococcal 23 valent vaccine  0.5 mL Intramuscular Tomorrow-1000  . thiamine  100 mg Oral Daily   Or  . thiamine  100 mg Intravenous Daily  . Warfarin - Pharmacist Dosing Inpatient   Does not apply q1800   No prescriptions prior to admission   Assessment: Lupus anticoagulant disorder, splenic vein partial thrombosis Lovenox treatment dosage started by MD H/H lower this AM, no bleeding noted.  Goal of Therapy:  INR 2-3 Monitor platelets by anticoagulation protocol: Yes   Plan:  Repeat Coumadin 7.5 mg po  x 1 today INR/PT daily Continue Lovenox until INR therapeutic Monitor CBC , platelets Education materials (has been on warfarin in the past).  [name] R 09/24/2014,8:25 AM

## 2014-09-24 NOTE — Progress Notes (Signed)
Patient watching understanding blood thinners video at this time. Patient stated she has been on blood thinners before and understands. Will give information anyway.

## 2014-09-24 NOTE — Progress Notes (Signed)
Patient asleep at this time.

## 2014-09-24 NOTE — Progress Notes (Signed)
[facility] PROGRESS NOTE  [name] GUR:[id number] DOB: 07-Dec-1991 DOA: 09/23/2014 PCP: Rosita Fire, MD  Assessment/Plan: #1 partial splenic vein thrombosis - in the setting of antiphospholipid syndrome. Hx of multiple PE's and DVT's per chart.  Coumadin per pharmacy. Patient on coumadin in past. Denies abdominal pain/nausea this am.  Continue Percocet for her pain, as well as antiemetics for nausea. Await GI recommendations.  Patient currently does not have a primary care physician. She will need one for chronic management of her anticoagulation needs.   #2 acute hepatitis  Likely alcoholic. Elevated AST/ALT. Direct bili 5.0.  await HIV labs. Of note, she has had positive screening test for HIV in the past, however the confirmation test ruled this out. Patient very teary about diagnosis.   #3 alcohol withdrawal  No s/sx withdrawal at this time. CIWA protocol. Social work consult. Check Vit B and folate.  #4. Hypokalemia: related to diarrhea and decreased po intake. Will replete and recheck.   #5. Asthma: appears stable at baseline. Chart review indicates  Advair in past.   #6. Tobacco use: cessation counseling. Nicotine patch  #7. Depression/anxiety: hx of same. Hx Suicide attempt. Denies wanting to do harm to self. Somewhat teary "dont want to deal with having Hep C".  Monitor closely  #8. GERD: stable at baseline. PPI  #9. Substance abuse: social work consult  #10. Likely dilutional. No s/sx bleeding. Will monitor. Anemia panel.    Code Status: full Family Communication: none present Disposition Plan: home when [place]   Consultants:  none  Procedures:  none  Antibiotics:  none  HPI/Subjective: Awake alert. Denies pain/discomfort. Teary. Reports " i dont want to talk about having Hep"  Objective: Filed Vitals:   09/24/14 0623  BP: 117/53  Pulse: 71  Temp: 97.6 F (36.4 C)  Resp: 18    Intake/Output Summary (Last 24 hours) at 09/24/14  1116 Last data filed at 09/24/14 0700  Gross per 24 hour  Intake 1003.33 ml  Output    200 ml  Net 803.33 ml   Filed Weights   09/23/14 1021 09/23/14 1814  Weight: 72.576 kg (160 lb) 72.576 kg (160 lb)    Exam:   General:  Well nourished NAD  Cardiovascular: RRR No MGR No LE edema  Respiratory: normal effort BS slightly coarse no crackles  Abdomen: soft +BS mild tenderness RUQ.   Musculoskeletal: no clubbing or cyanosis   Data Reviewed: Basic Metabolic Panel:  Recent Labs Lab 09/23/14 1020 09/24/14 0538  NA 135* 140  K 3.2* 2.9*  CL 92* 99  CO2 23 28  GLUCOSE 103* 78  BUN 7 7  CREATININE 0.67 0.78  CALCIUM 9.3 8.2*   Liver Function Tests:  Recent Labs Lab 09/23/14 1020 09/24/14 0538  AST 925* 510*  ALT 500* 326*  ALKPHOS 397* 284*  BILITOT 10.7* 8.4*  PROT 8.5* 6.6  ALBUMIN 3.8 2.8*    Recent Labs Lab 09/23/14 1020  LIPASE 19   No results found for this basename: AMMONIA,  in the last 168 hours CBC:  Recent Labs Lab 09/23/14 1020 09/24/14 0538  WBC 7.5 5.9  NEUTROABS 3.6  --   HGB 12.0 9.8*  HCT 35.3* 30.1*  MCV 81.0 83.6  PLT 544* 429*   Cardiac Enzymes: No results found for this basename: CKTOTAL, CKMB, CKMBINDEX, TROPONINI,  in the last 168 hours BNP (last 3 results) No results found for this basename: PROBNP,  in the last 8760 hours CBG: No results found for this  basename: GLUCAP,  in the last 168 hours  No results found for this or any previous visit (from the past 240 hour(s)).   Studies: US Abdomen Complete  09/23/2014   CLINICAL DATA:  Right upper quadrant pain. Acute onset of jaundice. History of lupus.  EXAM: ULTRASOUND ABDOMEN COMPLETE  COMPARISON:  11/23/2005  FINDINGS: Gallbladder:  Echogenic structure along the gallbladder wall measures 0.5 cm. This structure is non mobile and suggestive for a gallbladder polyp. No definite gallstones. Reportedly, the patient has a sonographic Murphy's sign.  Common bile duct:   Diameter: 0.4 cm  Liver:  Liver parenchyma is within normal limits. The main portal vein is patent. No focal liver lesion.  IVC:  No abnormality visualized.  Pancreas:  There is echogenic material within the splenic vein near the portal confluence. Findings are suggestive for venous thrombus. The adjacent pancreas is mildly heterogeneous without duct dilatation.  Spleen:  Measures roughly 10.1 cm in length and the splenic volume is 330 mL.  Right Kidney:  Length: 11.5 cm. No evidence hydronephrosis. There are echogenic foci in the lower pole which could represent stones. In addition, there is a round hypoechoic structure in the right kidney that roughly measures 1.1 cm and probably represents a cyst.  Left Kidney:  Length: 11.9 cm. Echogenicity within normal limits. No mass or hydronephrosis visualized.  Abdominal aorta:  No aneurysm visualized.  Other findings:  None.  IMPRESSION: Echogenic material in the splenic vein near the portal vein confluence. Findings are concerning for venous thrombus. Recommend further evaluation with a CT with intravenous contrast.  Gallbladder polyp measuring 0.5 cm.  Probable right renal cyst.  Echogenic foci in the right kidney could represent kidney stones. Negative for hydronephrosis.  These results were called by telephone at the time of interpretation on 09/23/2014 at 1:30 pm to Dr. Rolland Porter , who verbally acknowledged these results.   Electronically Signed   By: Markus Daft M.D.   On: 09/23/2014 13:32   Ct Abdomen Pelvis W Contrast  09/23/2014   CLINICAL DATA:  Right-sided abdominal pain.  Vomiting.  Jaundice.  EXAM: CT ABDOMEN AND PELVIS WITH CONTRAST  TECHNIQUE: Multidetector CT imaging of the abdomen and pelvis was performed using the standard protocol following bolus administration of intravenous contrast.  CONTRAST:  49mL OMNIPAQUE IOHEXOL 300 MG/ML SOLN, 19mL OMNIPAQUE IOHEXOL 300 MG/ML SOLN  COMPARISON:  Ultrasound 09/23/2014.  CT chest 09/06/2010.  FINDINGS: Diffuse  fatty infiltration of the liver. Focal area of low density noted in the anterior aspect of the liver adjacent to the falciform ligament is noted. This most likely represents an area of focal fatty infiltration or benign lesion such as hemangioma. Similar finding was noted on prior see CT chest study of 09/06/2010 but is better demonstrated on today's abdominal exam. Spleen is normal. Nonocclusive splenic vein thrombosis is noted. The portal vein patent. Hepatic veins are patent.  Adrenals are unremarkable. Tiny lucencies in the kidneys, most likely simple cysts. No hydronephrosis. No evidence of obstructing ureteral stone. Bladder is nondistended. Mild bladder wall thickening noted. This could be related to bladder wall pathology. This may be related to lack of distention of the bladder. Uterus is prominent. Bilateral tubal ligations. Small amount of free pelvic fluid. This is nonspecific.  No significant adenopathy. Abdominal aorta normal caliber. The visceral vessels are patent.  Appendix is normal. No evidence of bowel distention. No free air. Stomach is nondistended. No mesenteric mass. No significant abdominal wall hernia.  Heart size normal. Lung  bases clear of infiltrates. Mild basilar atelectasis. No acute bony abnormality.  IMPRESSION: 1. Partial thrombosis of the splenic vein as noted on prior ultrasound. Portal vein and hepatic veins are patent. 2. Mild bladder wall thickening. This may be related to nondistended state. Bladder wall pathology including cystitis cannot be excluded .   Electronically Signed   By: [place]   On: 09/23/2014 16:35    Scheduled Meds: . sodium chloride  1,000 mL Intravenous Once  . [COMPLETED] coumadin book   Does not apply Once  . enoxaparin (LOVENOX) injection  1.5 mg/kg Subcutaneous Q24H  . folic acid  1 mg Oral Daily  . multivitamin with minerals  1 tablet Oral Daily  . thiamine  100 mg Oral Daily   Or  . thiamine  100 mg Intravenous Daily  . warfarin   7.5 mg Oral Once  . [COMPLETED] warfarin   Does not apply Once  . [START ON 09/25/2014] Warfarin - Pharmacist Dosing Inpatient   Does not apply Q24H   Continuous Infusions: . sodium chloride    . sodium chloride 100 mL/hr at 09/24/14 0535    Principal Problem:   Acute hepatitis Active Problems:   TOBACCO ABUSE   Anticoagulant long-term use   Abdominal pain, chronic, epigastric   Splenic vein thrombosis   Alcohol withdrawal   Anemia   Hypokalemia   Substance abuse   Bipolar disorder   GERD (gastroesophageal reflux disease)   Depression    Time spent: 35 minutes    [facility] Pager [phone]. If 7PM-7AM, please contact night-coverage at [email or web address], password [facility] 09/24/2014, 11:16 AM  LOS: 1 day

## 2014-09-25 DIAGNOSIS — N390 Urinary tract infection, site not specified: Secondary | ICD-10-CM

## 2014-09-25 DIAGNOSIS — Z7901 Long term (current) use of anticoagulants: Secondary | ICD-10-CM

## 2014-09-25 DIAGNOSIS — D538 Other specified nutritional anemias: Secondary | ICD-10-CM

## 2014-09-25 LAB — BASIC METABOLIC PANEL
Anion gap: 11 (ref 5–15)
BUN: 5 mg/dL — ABNORMAL LOW (ref 6–23)
CO2: 25 mEq/L (ref 19–32)
CREATININE: 0.78 mg/dL (ref 0.50–1.10)
Calcium: 7.7 mg/dL — ABNORMAL LOW (ref 8.4–10.5)
Chloride: 103 mEq/L (ref 96–112)
GFR calc non Af Amer: >90 mL/min (ref >90)
Glucose, Bld: 96 mg/dL (ref 70–99)
Potassium: 3.4 mEq/L — ABNORMAL LOW (ref 3.7–5.3)
Sodium: 139 mEq/L (ref 137–147)

## 2014-09-25 LAB — CBC
HCT: 27 % — ABNORMAL LOW (ref 36.0–46.0)
Hemoglobin: 8.7 g/dL — ABNORMAL LOW (ref 12.0–15.0)
MCH: 27.4 pg (ref 26.0–34.0)
MCHC: 32.2 g/dL (ref 30.0–36.0)
MCV: 84.9 fL (ref 78.0–100.0)
PLATELETS: 350 10*3/uL (ref 150–400)
RBC: 3.18 MIL/uL — ABNORMAL LOW (ref 3.87–5.11)
RDW: 22.2 % — AB (ref 11.5–15.5)
WBC: 5.4 10*3/uL (ref 4.0–10.5)

## 2014-09-25 LAB — PROTIME-INR
INR: 1.53 — ABNORMAL HIGH (ref 0.00–1.49)
Prothrombin Time: 18.4 seconds — ABNORMAL HIGH (ref 11.6–15.2)

## 2014-09-25 LAB — HEPATIC FUNCTION PANEL
ALT: 290 U/L — ABNORMAL HIGH (ref 0–35)
AST: 451 U/L — ABNORMAL HIGH (ref 0–37)
Albumin: 2.4 g/dL — ABNORMAL LOW (ref 3.5–5.2)
Alkaline Phosphatase: 218 U/L — ABNORMAL HIGH (ref 39–117)
BILIRUBIN DIRECT: 4.4 mg/dL — AB (ref 0.0–0.3)
BILIRUBIN INDIRECT: 1.4 mg/dL — AB (ref 0.3–0.9)
Total Bilirubin: 5.8 mg/dL — ABNORMAL HIGH (ref 0.3–1.2)
Total Protein: 5.7 g/dL — ABNORMAL LOW (ref 6.0–8.3)

## 2014-09-25 LAB — MAGNESIUM: Magnesium: 1.4 mg/dL — ABNORMAL LOW (ref 1.5–2.5)

## 2014-09-25 MED ORDER — CIPROFLOXACIN HCL 250 MG PO TABS
250.0000 mg | ORAL_TABLET | Freq: Two times a day (BID) | ORAL | Status: DC
  Administered 2014-09-25 – 2014-09-27 (×4): 250 mg via ORAL
  Filled 2014-09-25 (×5): qty 1

## 2014-09-25 MED ORDER — WARFARIN SODIUM 5 MG PO TABS
7.5000 mg | ORAL_TABLET | Freq: Once | ORAL | Status: AC
  Administered 2014-09-25: 7.5 mg via ORAL
  Filled 2014-09-25: qty 2

## 2014-09-25 MED ORDER — POTASSIUM CHLORIDE CRYS ER 20 MEQ PO TBCR
40.0000 meq | EXTENDED_RELEASE_TABLET | Freq: Once | ORAL | Status: AC
  Administered 2014-09-25: 40 meq via ORAL
  Filled 2014-09-25: qty 2

## 2014-09-25 NOTE — Progress Notes (Signed)
Subjective: Continues to report abdominal pain. Mild nausea, no vomiting. Ate most of breakfast.    Objective: Vital signs in last 24 hours: Temp:  [98 F (36.7 C)-98.6 F (37 C)] 98 F (36.7 C) (09/29 0627) Pulse Rate:  [68-94] 94 (09/29 0627) Resp:  [18-19] 18 (09/29 0627) BP: (101-124)/(60-84) 116/80 mmHg (09/29 0627) SpO2:  [94 %-100 %] 100 % (09/29 0627) Last BM Date: 09/24/14 General:   Alert and oriented, pleasant, jaundiced Head:  Normocephalic and atraumatic. Eyes:  +scleral icterus Abdomen:  Bowel sounds present, soft, TTP diffusely without peritoneal signs Extremities:  Without edema. Neurologic:  Alert and  oriented x4;  grossly normal neurologically. Psych:  Alert and cooperative. Normal mood and affect.  Intake/Output from previous day: 09/28 0701 - 09/29 0700 In: 2440 [P.O.:240; I.V.:2200] Out: 900 [Urine:900] Intake/Output this shift:    Lab Results:  Recent Labs  09/23/14 1020 09/24/14 0538 09/25/14 0534  WBC 7.5 5.9 5.4  HGB 12.0 9.8* 8.7*  HCT 35.3* 30.1* 27.0*  PLT 544* 429* 350   BMET  Recent Labs  09/23/14 1020 09/24/14 0538 09/25/14 0534  NA 135* 140 139  K 3.2* 2.9* 3.4*  CL 92* 99 103  CO2 23 28 25   GLUCOSE 103* 78 96  BUN 7 7 5*  CREATININE 0.67 0.78 0.78  CALCIUM 9.3 8.2* 7.7*   LFT  Recent Labs  09/23/14 1020 09/23/14 1953 09/24/14 0538 09/25/14 0534  PROT 8.5*  --  6.6 5.7*  ALBUMIN 3.8  --  2.8* 2.4*  AST 925*  --  510* 451*  ALT 500*  --  326* 290*  ALKPHOS 397*  --  284* 218*  BILITOT 10.7*  --  8.4* 5.8*  BILIDIR  --  5.0*  --  4.4*  IBILI  --   --   --  1.4*   PT/INR  Recent Labs  09/24/14 0538 09/25/14 0534  LABPROT 16.1* 18.4*  INR 1.29 1.53*   Hepatitis Panel  Recent Labs  09/24/14 0538  HEPBSAG NEGATIVE  HCVAB Reactive*  HEPAIGM NON REACTIVE  HEPBIGM NON REACTIVE     Studies/Results: US Abdomen Complete  09/23/2014   CLINICAL DATA:  Right upper quadrant pain. Acute onset of  jaundice. History of lupus.  EXAM: ULTRASOUND ABDOMEN COMPLETE  COMPARISON:  11/23/2005  FINDINGS: Gallbladder:  Echogenic structure along the gallbladder wall measures 0.5 cm. This structure is non mobile and suggestive for a gallbladder polyp. No definite gallstones. Reportedly, the patient has a sonographic Murphy's sign.  Common bile duct:  Diameter: 0.4 cm  Liver:  Liver parenchyma is within normal limits. The main portal vein is patent. No focal liver lesion.  IVC:  No abnormality visualized.  Pancreas:  There is echogenic material within the splenic vein near the portal confluence. Findings are suggestive for venous thrombus. The adjacent pancreas is mildly heterogeneous without duct dilatation.  Spleen:  Measures roughly 10.1 cm in length and the splenic volume is 330 mL.  Right Kidney:  Length: 11.5 cm. No evidence hydronephrosis. There are echogenic foci in the lower pole which could represent stones. In addition, there is a round hypoechoic structure in the right kidney that roughly measures 1.1 cm and probably represents a cyst.  Left Kidney:  Length: 11.9 cm. Echogenicity within normal limits. No mass or hydronephrosis visualized.  Abdominal aorta:  No aneurysm visualized.  Other findings:  None.  IMPRESSION: Echogenic material in the splenic vein near the portal vein confluence. Findings are concerning for  venous thrombus. Recommend further evaluation with a CT with intravenous contrast.  Gallbladder polyp measuring 0.5 cm.  Probable right renal cyst.  Echogenic foci in the right kidney could represent kidney stones. Negative for hydronephrosis.  These results were called by telephone at the time of interpretation on 09/23/2014 at 1:30 pm to Dr. Rolland Porter , who verbally acknowledged these results.   Electronically Signed   By: Markus Daft M.D.   On: 09/23/2014 13:32   Ct Abdomen Pelvis W Contrast  09/23/2014   CLINICAL DATA:  Right-sided abdominal pain.  Vomiting.  Jaundice.  EXAM: CT ABDOMEN AND  PELVIS WITH CONTRAST  TECHNIQUE: Multidetector CT imaging of the abdomen and pelvis was performed using the standard protocol following bolus administration of intravenous contrast.  CONTRAST:  68mL OMNIPAQUE IOHEXOL 300 MG/ML SOLN, 121mL OMNIPAQUE IOHEXOL 300 MG/ML SOLN  COMPARISON:  Ultrasound 09/23/2014.  CT chest 09/06/2010.  FINDINGS: Diffuse fatty infiltration of the liver. Focal area of low density noted in the anterior aspect of the liver adjacent to the falciform ligament is noted. This most likely represents an area of focal fatty infiltration or benign lesion such as hemangioma. Similar finding was noted on prior see CT chest study of 09/06/2010 but is better demonstrated on today's abdominal exam. Spleen is normal. Nonocclusive splenic vein thrombosis is noted. The portal vein patent. Hepatic veins are patent.  Adrenals are unremarkable. Tiny lucencies in the kidneys, most likely simple cysts. No hydronephrosis. No evidence of obstructing ureteral stone. Bladder is nondistended. Mild bladder wall thickening noted. This could be related to bladder wall pathology. This may be related to lack of distention of the bladder. Uterus is prominent. Bilateral tubal ligations. Small amount of free pelvic fluid. This is nonspecific.  No significant adenopathy. Abdominal aorta normal caliber. The visceral vessels are patent.  Appendix is normal. No evidence of bowel distention. No free air. Stomach is nondistended. No mesenteric mass. No significant abdominal wall hernia.  Heart size normal. Lung bases clear of infiltrates. Mild basilar atelectasis. No acute bony abnormality.  IMPRESSION: 1. Partial thrombosis of the splenic vein as noted on prior ultrasound. Portal vein and hepatic veins are patent. 2. Mild bladder wall thickening. This may be related to nondistended state. Bladder wall pathology including cystitis cannot be excluded .   Electronically Signed   By: Marcello Moores  Register   On: 09/23/2014 16:35     Assessment: 23 year old female admitted with acute hepatitis in the setting of ETOH abuse and also noted to be Hep C antibody positive. Discriminant function less than 30 on admission, and LFTs improving since admission. As of note, history of HIV antibody positive in past with subsequent negative confirmation test; HIV antibody negative this admission. Supportive measures indicated at this time.  HCV RNA with reflex genotype pending. . Discussed alcohol cessation with patient, including need for alcohol and drug cessation for at least 6 months prior to treatment for Hepatitis C.   Anemia: no overt signs of GI bleeding. Multifactorial. Anemia panel ordered with normal ferritin, elevated iron.    Splenic vein thrombosis: with known antiphospholipid syndrome. Anticoagulation per pharmacy.   Diarrhea: resolved. Check stool studies if recurs.   Plan: Continue PPI for GI prophylaxis Follow-up on pending Hep C RNA with genotype Follow LFTs to baseline Supportive care Stable from a GI standpoint with close outpatient follow-up   Orvil Feil, ANP-BC [facility]    LOS: 2 days    09/25/2014, 9:28 AM

## 2014-09-25 NOTE — Progress Notes (Signed)
Patient asleep at this time. Woke up to take medications earlier this morning, but very drowsy.

## 2014-09-25 NOTE — Progress Notes (Signed)
Patient seen and examined.  Above note reviewed.  She continues to have some abdominal pain. She also has suprapubic pain. UA on admission indicates possible UTI. Will send urine culture and start oral antibiotics. For her splenic vein thrombosis, will need to continue lovenox and coumadin until INR is therapeutic prior to discharge.  MEMON,JEHANZEB

## 2014-09-25 NOTE — Progress Notes (Signed)
ANTICOAGULATION CONSULT NOTE  Pharmacy Consult for Coumadin Indication: Splenic vein partial thrombosis                     Lupus anticoagulant disorder  No Known Allergies  Patient Measurements: Height: 5\' 7"  (170.2 cm) Weight: 160 lb (72.576 kg) IBW/kg (Calculated) : 61.6  Vital Signs: Temp: 98 F (36.7 C) (09/29 0627) Temp src: Oral (09/29 0627) BP: 116/80 mmHg (09/29 0627) Pulse Rate: 94 (09/29 0627)  Labs:  Recent Labs  09/23/14 1020 09/23/14 1721 09/24/14 0538 09/25/14 0534  HGB 12.0  --  9.8* 8.7*  HCT 35.3*  --  30.1* 27.0*  PLT 544*  --  429* 350  APTT  --  29  --   --   LABPROT  --  15.0 16.1* 18.4*  INR  --  1.18 1.29 1.53*  CREATININE 0.67  --  0.78 0.78   Estimated Creatinine Clearance: 105.4 ml/min (by C-G formula based on Cr of 0.78).  Medical History: Past Medical History  Diagnosis Date  . Pulmonary embolism 06/2010    Multiple bilateral pulmonary emboli diagnosed by CT scan in in 06/2010.  Moderate size pleural effusion and right lower lobe infarction present at that time.  Lupus anticoagulant was present, but all other aspects of her hypercoagulable evaluation were negative including all Cardiolite and antibodies.    . Anticoagulant long-term use   . Lupus anticoagulant disorder     Previously followed by Dr. Lieutenant Diego of discharged from his care due to medical noncompliance.  Marland Kitchen HIV positive     Western Blot negative  . Tobacco abuse   . Anemia   . Substance abuse     cocaine, opiates, marijuana; alcohol; most recent positive test for cocaine was in December of 2011; alcohol levels have been as high as 302  . Anxiety and depression     suicide attempt-age 25  . Attention deficit hyperactivity disorder   . Asthma     ABG in 11/2010:7.45, 34, 79., ventolin rescue inhaler last use 03/21/12  . Anxiety   . Panic attacks     not on meds  . Bipolar disorder     not on meds  . Abnormal Pap smear   . GERD (gastroesophageal reflux disease)    . Recurrent upper respiratory infection (URI)   . Cough productive of purulent sputum   . Edema in pregnancy   . Shortness of breath     has asthma- scheduled for PFTs on 04/06/12  . Dermatitis due to detergents   . Antiphospholipid antibody with hypercoagulable state   . Depression     suicide attempt at age 33   Medications:  Scheduled:  . sodium chloride  1,000 mL Intravenous Once  . enoxaparin (LOVENOX) injection  1.5 mg/kg Subcutaneous Q24H  . folic acid  1 mg Oral Daily  . multivitamin with minerals  1 tablet Oral Daily  . nicotine  21 mg Transdermal Daily  . pantoprazole  40 mg Oral Daily  . thiamine  100 mg Oral Daily   Or  . thiamine  100 mg Intravenous Daily  . Warfarin - Pharmacist Dosing Inpatient   Does not apply Q24H   No prescriptions prior to admission   Assessment: 22 yoF admitted with Lupus anticoagulant disorder, splenic vein partial thrombosis in patient with Hepatitis C.  Patient without insurance so planning for Lovenox bridging to warfarin.  Lovenox treatment dosage started by MD.  Day#3 Lovenox-->Warfarin overlap.  INR rising to  goal. H/H lower this AM, no bleeding noted.  Goal of Therapy:  INR 2-3 Monitor platelets by anticoagulation protocol: Yes   Plan:  Repeat Coumadin 7.5 mg po x 1 today INR daily Continue Lovenox for a minimum of 5 days & until INR therapeutic x24hrs Monitor CBC  Education materials (has been on warfarin in the past).  Biagio Borg 09/25/2014,9:37 AM

## 2014-09-25 NOTE — Progress Notes (Signed)
[facility] PROGRESS NOTE  [name] NWG:[id number] DOB: [birth year] DOA: 09/23/2014 PCP: No primary provider on file.  Assessment/Plan: #1 partial splenic vein thrombosis - in the setting of antiphospholipid syndrome. Hx of multiple PE's and DVT's per chart. Coumadin per pharmacy. Patient hx non-compliance. INR remains sub-therapeutic. Continue lovenox bridge. Patient currently does not have a primary care physician and will [facility].    #2 acute hepatitis -Likely alcoholic. AST/ALT trending downward. Direct bili 4.4. Of note, she has had positive screening test for HIV in the past, however the confirmation test ruled this out. Some nausea no vomiting. GI evaluated and opines stable and will need close OP follow up.   #3 alcohol withdrawal -No s/sx withdrawal at this time. CIWA protocol. Social work consult. Folate and B12 high.  #4. Hypokalemia: related to diarrhea and decreased po intake. Will replete and recheck. Check mag level as well.    #5. Asthma: remains stable at baseline. Chart review indicates Advair in past.   #6. Tobacco use: cessation counseling. Nicotine patch   #7. Depression/anxiety: hx of same. Hx Suicide attempt. Denies wanting to do harm to self. Somewhat teary "dont want to deal with having Hep C". Monitor closely   #8. GERD: stable at baseline. PPI   #9. Substance abuse: social work consult   #10. Likely dilutional. No s/sx bleeding. Will monitor. Iron and B12 high. Will dc IV fluids as po intake improved   Code Status:full Family Communication: none present Disposition Plan: home when INR therapeutic   Consultants:  GI  Procedures:  none  Antibiotics:   none  HPI/Subjective: Awake   Objective: Filed Vitals:   09/25/14 0627  BP: 116/80  Pulse: 94  Temp: 98 F (36.7 C)  Resp: 18    Intake/Output Summary (Last 24 hours) at 09/25/14 0929 Last data filed at 09/25/14 0439  Gross per 24 hour  Intake   2440  ml  Output    900 ml  Net   1540 ml   Filed Weights   09/23/14 1021 09/23/14 1814  Weight: 72.576 kg (160 lb) 72.576 kg (160 lb)    Exam:   General:  Well nourished   Cardiovascular: RRR No MGR No LE edema  Respiratory: normal effort BS somewhat coarse no wheeze  Abdomen: soft +BS mild diffuse tenderness to palpation. No guarding no rebound  Musculoskeletal: no clubbing or cyanosis   Data Reviewed: Basic Metabolic Panel:  Recent Labs Lab 09/23/14 1020 09/24/14 0538 09/25/14 0534  NA 135* 140 139  K 3.2* 2.9* 3.4*  CL 92* 99 103  CO2 23 28 25   GLUCOSE 103* 78 96  BUN 7 7 5*  CREATININE 0.67 0.78 0.78  CALCIUM 9.3 8.2* 7.7*   Liver Function Tests:  Recent Labs Lab 09/23/14 1020 09/24/14 0538 09/25/14 0534  AST 925* 510* 451*  ALT 500* 326* 290*  ALKPHOS 397* 284* 218*  BILITOT 10.7* 8.4* 5.8*  PROT 8.5* 6.6 5.7*  ALBUMIN 3.8 2.8* 2.4*    Recent Labs Lab 09/23/14 1020  LIPASE 19   No results found for this basename: AMMONIA,  in the last 168 hours CBC:  Recent Labs Lab 09/23/14 1020 09/24/14 0538 09/25/14 0534  WBC 7.5 5.9 5.4  NEUTROABS 3.6  --   --   HGB 12.0 9.8* 8.7*  HCT 35.3* 30.1* 27.0*  MCV 81.0 83.6 84.9  PLT 544* 429* 350   Cardiac Enzymes: No results found for this basename: CKTOTAL, CKMB,  CKMBINDEX, TROPONINI,  in the last 168 hours BNP (last 3 results) No results found for this basename: PROBNP,  in the last 8760 hours CBG: No results found for this basename: GLUCAP,  in the last 168 hours  No results found for this or any previous visit (from the past 240 hour(s)).   Studies: US Abdomen Complete  09/23/2014   CLINICAL DATA:  Right upper quadrant pain. Acute onset of jaundice. History of lupus.  EXAM: ULTRASOUND ABDOMEN COMPLETE  COMPARISON:  11/23/2005  FINDINGS: Gallbladder:  Echogenic structure along the gallbladder wall measures 0.5 cm. This structure is non mobile and suggestive for a gallbladder polyp. No definite  gallstones. Reportedly, the patient has a sonographic Murphy's sign.  Common bile duct:  Diameter: 0.4 cm  Liver:  Liver parenchyma is within normal limits. The main portal vein is patent. No focal liver lesion.  IVC:  No abnormality visualized.  Pancreas:  There is echogenic material within the splenic vein near the portal confluence. Findings are suggestive for venous thrombus. The adjacent pancreas is mildly heterogeneous without duct dilatation.  Spleen:  Measures roughly 10.1 cm in length and the splenic volume is 330 mL.  Right Kidney:  Length: 11.5 cm. No evidence hydronephrosis. There are echogenic foci in the lower pole which could represent stones. In addition, there is a round hypoechoic structure in the right kidney that roughly measures 1.1 cm and probably represents a cyst.  Left Kidney:  Length: 11.9 cm. Echogenicity within normal limits. No mass or hydronephrosis visualized.  Abdominal aorta:  No aneurysm visualized.  Other findings:  None.  IMPRESSION: Echogenic material in the splenic vein near the portal vein confluence. Findings are concerning for venous thrombus. Recommend further evaluation with a CT with intravenous contrast.  Gallbladder polyp measuring 0.5 cm.  Probable right renal cyst.  Echogenic foci in the right kidney could represent kidney stones. Negative for hydronephrosis.  These results were called by telephone at the time of interpretation on 09/23/2014 at 1:30 pm to Dr. Rolland Porter , who verbally acknowledged these results.   Electronically Signed   By: Markus Daft M.D.   On: 09/23/2014 13:32   Ct Abdomen Pelvis W Contrast  09/23/2014   CLINICAL DATA:  Right-sided abdominal pain.  Vomiting.  Jaundice.  EXAM: CT ABDOMEN AND PELVIS WITH CONTRAST  TECHNIQUE: Multidetector CT imaging of the abdomen and pelvis was performed using the standard protocol following bolus administration of intravenous contrast.  CONTRAST:  18mL OMNIPAQUE IOHEXOL 300 MG/ML SOLN, 173mL OMNIPAQUE IOHEXOL 300  MG/ML SOLN  COMPARISON:  Ultrasound 09/23/2014.  CT chest 09/06/2010.  FINDINGS: Diffuse fatty infiltration of the liver. Focal area of low density noted in the anterior aspect of the liver adjacent to the falciform ligament is noted. This most likely represents an area of focal fatty infiltration or benign lesion such as hemangioma. Similar finding was noted on prior see CT chest study of 09/06/2010 but is better demonstrated on today's abdominal exam. Spleen is normal. Nonocclusive splenic vein thrombosis is noted. The portal vein patent. Hepatic veins are patent.  Adrenals are unremarkable. Tiny lucencies in the kidneys, most likely simple cysts. No hydronephrosis. No evidence of obstructing ureteral stone. Bladder is nondistended. Mild bladder wall thickening noted. This could be related to bladder wall pathology. This may be related to lack of distention of the bladder. Uterus is prominent. Bilateral tubal ligations. Small amount of free pelvic fluid. This is nonspecific.  No significant adenopathy. Abdominal aorta normal caliber. The visceral  vessels are patent.  Appendix is normal. No evidence of bowel distention. No free air. Stomach is nondistended. No mesenteric mass. No significant abdominal wall hernia.  Heart size normal. Lung bases clear of infiltrates. Mild basilar atelectasis. No acute bony abnormality.  IMPRESSION: 1. Partial thrombosis of the splenic vein as noted on prior ultrasound. Portal vein and hepatic veins are patent. 2. Mild bladder wall thickening. This may be related to nondistended state. Bladder wall pathology including cystitis cannot be excluded .   Electronically Signed   By: Marcello Moores  Register   On: 09/23/2014 16:35    Scheduled Meds: . sodium chloride  1,000 mL Intravenous Once  . enoxaparin (LOVENOX) injection  1.5 mg/kg Subcutaneous Q24H  . folic acid  1 mg Oral Daily  . multivitamin with minerals  1 tablet Oral Daily  . nicotine  21 mg Transdermal Daily  . pantoprazole   40 mg Oral Daily  . thiamine  100 mg Oral Daily   Or  . thiamine  100 mg Intravenous Daily  . Warfarin - Pharmacist Dosing Inpatient   Does not apply Q24H   Continuous Infusions: . sodium chloride 50 mL/hr at 09/25/14 0745    Principal Problem:   Acute hepatitis Active Problems:   TOBACCO ABUSE   Anticoagulant long-term use   Abdominal pain, chronic, epigastric   Splenic vein thrombosis   Alcohol withdrawal   Anemia   Hypokalemia   Substance abuse   Bipolar disorder   GERD (gastroesophageal reflux disease)   Depression    Time spent: 35 minutes    [facility] Pager [phone]. If 7PM-7AM, please contact night-coverage at [email or web address], password [facility] 09/25/2014, 9:29 AM  LOS: 2 days

## 2014-09-25 NOTE — Progress Notes (Addendum)
Notations made in the wrong chart and deleted

## 2014-09-25 NOTE — Progress Notes (Signed)
REVIEWED-NO ADDITIONAL RECOMMENDATIONS. 

## 2014-09-26 DIAGNOSIS — N390 Urinary tract infection, site not specified: Secondary | ICD-10-CM

## 2014-09-26 LAB — CBC
HCT: 30.5 % — ABNORMAL LOW (ref 36.0–46.0)
HCT: 29.8 % — ABNORMAL LOW (ref 36.0–46.0)
HEMOGLOBIN: 9.7 g/dL — AB (ref 12.0–15.0)
Hemoglobin: 9.9 g/dL — ABNORMAL LOW (ref 12.0–15.0)
MCH: 27.5 pg (ref 26.0–34.0)
MCH: 27.6 pg (ref 26.0–34.0)
MCHC: 32.5 g/dL (ref 30.0–36.0)
MCHC: 32.6 g/dL (ref 30.0–36.0)
MCV: 84.7 fL (ref 78.0–100.0)
MCV: 84.9 fL (ref 78.0–100.0)
PLATELETS: 362 10*3/uL (ref 150–400)
Platelets: 394 10*3/uL (ref 150–400)
RBC: 3.6 MIL/uL — ABNORMAL LOW (ref 3.87–5.11)
RBC: 3.51 MIL/uL — ABNORMAL LOW (ref 3.87–5.11)
RDW: 22.8 % — AB (ref 11.5–15.5)
RDW: 23.3 % — ABNORMAL HIGH (ref 11.5–15.5)
WBC: 6.1 10*3/uL (ref 4.0–10.5)
WBC: 5.8 10*3/uL (ref 4.0–10.5)

## 2014-09-26 LAB — COMPREHENSIVE METABOLIC PANEL
ALBUMIN: 2.6 g/dL — AB (ref 3.5–5.2)
ALT: 397 U/L — ABNORMAL HIGH (ref 0–35)
AST: 616 U/L — AB (ref 0–37)
Alkaline Phosphatase: 236 U/L — ABNORMAL HIGH (ref 39–117)
Anion gap: 7 (ref 5–15)
BUN: 7 mg/dL (ref 6–23)
CO2: 29 mEq/L (ref 19–32)
CREATININE: 0.83 mg/dL (ref 0.50–1.10)
Calcium: 8.4 mg/dL (ref 8.4–10.5)
Chloride: 99 mEq/L (ref 96–112)
GFR calc Af Amer: >90 mL/min (ref >90)
GFR calc non Af Amer: >90 mL/min (ref >90)
Glucose, Bld: 98 mg/dL (ref 70–99)
Potassium: 4.2 mEq/L (ref 3.7–5.3)
Sodium: 135 mEq/L — ABNORMAL LOW (ref 137–147)
Total Bilirubin: 3.8 mg/dL — ABNORMAL HIGH (ref 0.3–1.2)
Total Protein: 6.6 g/dL (ref 6.0–8.3)

## 2014-09-26 LAB — MAGNESIUM: MAGNESIUM: 1.8 mg/dL (ref 1.5–2.5)

## 2014-09-26 LAB — BASIC METABOLIC PANEL
ANION GAP: 13 (ref 5–15)
BUN: 5 mg/dL — ABNORMAL LOW (ref 6–23)
CO2: 26 mEq/L (ref 19–32)
Calcium: 8.2 mg/dL — ABNORMAL LOW (ref 8.4–10.5)
Chloride: 98 mEq/L (ref 96–112)
Creatinine, Ser: 0.81 mg/dL (ref 0.50–1.10)
Glucose, Bld: 143 mg/dL — ABNORMAL HIGH (ref 70–99)
POTASSIUM: 3.3 meq/L — AB (ref 3.7–5.3)
SODIUM: 137 meq/L (ref 137–147)

## 2014-09-26 LAB — HEPATIC FUNCTION PANEL
ALBUMIN: 2.6 g/dL — AB (ref 3.5–5.2)
ALT: 352 U/L — ABNORMAL HIGH (ref 0–35)
AST: 558 U/L — ABNORMAL HIGH (ref 0–37)
Alkaline Phosphatase: 242 U/L — ABNORMAL HIGH (ref 39–117)
BILIRUBIN INDIRECT: 1.4 mg/dL — AB (ref 0.3–0.9)
BILIRUBIN TOTAL: 5.1 mg/dL — AB (ref 0.3–1.2)
Bilirubin, Direct: 3.7 mg/dL — ABNORMAL HIGH (ref 0.0–0.3)
Total Protein: 6.6 g/dL (ref 6.0–8.3)

## 2014-09-26 LAB — PROTIME-INR
INR: 1.5 — ABNORMAL HIGH (ref 0.00–1.49)
PROTHROMBIN TIME: 18.1 s — AB (ref 11.6–15.2)

## 2014-09-26 LAB — PHOSPHORUS: Phosphorus: 3.8 mg/dL (ref 2.3–4.6)

## 2014-09-26 LAB — HCV RNA QUANT RFLX ULTRA OR GENOTYP
HCV QUANT LOG: 6.32 {Log} — AB (ref <1.18)
HCV QUANT: 2102985 [IU]/mL — AB (ref <15)

## 2014-09-26 MED ORDER — M.V.I. ADULT IV INJ: INJECTION | Freq: Once | INTRAVENOUS | Status: DC

## 2014-09-26 MED ORDER — WARFARIN SODIUM 5 MG PO TABS
10.0000 mg | ORAL_TABLET | Freq: Once | ORAL | Status: AC
  Administered 2014-09-26: 10 mg via ORAL
  Filled 2014-09-26: qty 2

## 2014-09-26 MED ORDER — DIPHENHYDRAMINE HCL 50 MG/ML IJ SOLN
12.5000 mg | Freq: Three times a day (TID) | INTRAMUSCULAR | Status: DC | PRN
  Administered 2014-09-26 (×2): 12.5 mg via INTRAVENOUS
  Filled 2014-09-26 (×2): qty 1

## 2014-09-26 MED ORDER — LORAZEPAM 2 MG/ML IJ SOLN: 2.0000 mg | INTRAMUSCULAR | Status: DC | PRN

## 2014-09-26 MED ORDER — MAGNESIUM SULFATE 40 MG/ML IJ SOLN
2.0000 g | Freq: Once | INTRAMUSCULAR | Status: AC
  Administered 2014-09-26: 2 g via INTRAVENOUS
  Filled 2014-09-26: qty 50

## 2014-09-26 MED ORDER — OXYCODONE HCL 5 MG PO TABS
5.0000 mg | ORAL_TABLET | Freq: Four times a day (QID) | ORAL | Status: DC | PRN
Start: 2014-09-26 — End: 2014-09-27
  Administered 2014-09-26 – 2014-09-27 (×3): 5 mg via ORAL
  Filled 2014-09-26 (×3): qty 1

## 2014-09-26 NOTE — Progress Notes (Signed)
Pt seen and examined. Agree with above note by Ms. Black, NP. Pt presents with with abd pain with splenic vein thrombus. Pt currently on Lovenox bridge while awaiting therapeutic INR. Cont to follow.

## 2014-09-26 NOTE — Progress Notes (Signed)
[facility] PROGRESS NOTE  [name] YWV:[id number] DOB: 29-Dec-1990 DOA: 09/23/2014 PCP: No primary provider on file.  Assessment/Plan: #1 partial splenic vein thrombosis - in the setting of antiphospholipid syndrome. Hx of multiple PE's and DVT's per chart. Coumadin per pharmacy. Patient hx non-compliance. INR remains sub-therapeutic but improving. Continue lovenox bridge. Patient currently does not have a primary care physician and [facility].   #2 acute hepatitis -Likely alcoholic. AST/ALT trending downward. Of note, she has had positive screening test for HIV in the past, however the confirmation test ruled this out. Some nausea no vomiting. Complains of continued RUQ pain. Tolerating fast food brought to her by visitors. Somewhat lethargic on exam. GI evaluated and opines stable and will need close OP follow up. Will decrease benadryl and pain med.    #3 alcohol withdrawal -No s/sx withdrawal at this time. CIWA protocol. Social work consult. Folate and B12 high.   #4. Hypokalemia: related to diarrhea and decreased po intake. Mag level i.4. Will replete and recheck.    #5. Asthma: remains stable at baseline. Chart review indicates Advair in past.   #6. Tobacco use: cessation counseling. Nicotine patch   #7. Depression/anxiety: stable at baseline   #8. GERD: stable at baseline. PPI   #9. Substance abuse: social work consult   #10. Anemia. Hx of same. Iron and B12 high normal ferritin. Current Hg at baseline. No s/sx bleeding monitor    Code Status: full Family Communication: none present Disposition Plan: home hopefully tomorrow   Consultants:  none  Procedures:  none  Antibiotics:   none HPI/Subjective: Awake but somewhat lethargic. Reports continued RUQ pain with nausea no vomiting. States " the benadryl helps me sleep  Objective: Filed Vitals:   09/26/14 0655  BP: 110/66  Pulse: 85  Temp: 97.8 F (36.6 C)  Resp: 20     Intake/Output Summary (Last 24 hours) at 09/26/14 1136 Last data filed at 09/26/14 0700  Gross per 24 hour  Intake    700 ml  Output      0 ml  Net    700 ml   Filed Weights   09/23/14 1021 09/23/14 1814  Weight: 72.576 kg (160 lb) 72.576 kg (160 lb)    Exam:   General:  Well nourished slightly lethargic  Cardiovascular: RRR i hear no MGR No LE edema PPP  Respiratory: normal effort BS clear bilaterally no crackles  Abdomen: round but soft, non-distended. +BS mild tenderness to palpation RUQ no guarding or rebounding  Musculoskeletal: good tone joints without swelling erythema   Data Reviewed: Basic Metabolic Panel:  Recent Labs Lab 09/23/14 1020 09/24/14 0538 09/25/14 0534  NA 135* 140 139  K 3.2* 2.9* 3.4*  CL 92* 99 103  CO2 23 28 25   GLUCOSE 103* 78 96  BUN 7 7 5*  CREATININE 0.67 0.78 0.78  CALCIUM 9.3 8.2* 7.7*  MG  --   --  1.4*   Liver Function Tests:  Recent Labs Lab 09/23/14 1020 09/24/14 0538 09/25/14 0534 09/26/14 0533  AST 925* 510* 451* 558*  ALT 500* 326* 290* 352*  ALKPHOS 397* 284* 218* 242*  BILITOT 10.7* 8.4* 5.8* 5.1*  PROT 8.5* 6.6 5.7* 6.6  ALBUMIN 3.8 2.8* 2.4* 2.6*    Recent Labs Lab 09/23/14 1020  LIPASE 19   No results found for this basename: AMMONIA,  in the last 168 hours CBC:  Recent Labs Lab 09/23/14 1020 09/24/14 0538 09/25/14 0534 09/26/14  0534  WBC 7.5 5.9 5.4 6.1  NEUTROABS 3.6  --   --   --   HGB 12.0 9.8* 8.7* 9.9*  HCT 35.3* 30.1* 27.0* 30.5*  MCV 81.0 83.6 84.9 84.7  PLT 544* 429* 350 394   Cardiac Enzymes: No results found for this basename: CKTOTAL, CKMB, CKMBINDEX, TROPONINI,  in the last 168 hours BNP (last 3 results) No results found for this basename: PROBNP,  in the last 8760 hours CBG: No results found for this basename: GLUCAP,  in the last 168 hours  No results found for this or any previous visit (from the past 240 hour(s)).   Studies: No results found.  Scheduled  Meds: . sodium chloride  1,000 mL Intravenous Once  . ciprofloxacin  250 mg Oral BID  . enoxaparin (LOVENOX) injection  1.5 mg/kg Subcutaneous Q24H  . folic acid  1 mg Oral Daily  . multivitamin with minerals  1 tablet Oral Daily  . nicotine  21 mg Transdermal Daily  . pantoprazole  40 mg Oral Daily  . thiamine  100 mg Oral Daily   Or  . thiamine  100 mg Intravenous Daily  . warfarin  10 mg Oral Once  . Warfarin - Pharmacist Dosing Inpatient   Does not apply Q24H   Continuous Infusions: . sodium chloride 10 mL/hr at 09/25/14 4656    Principal Problem:   Acute hepatitis Active Problems:   TOBACCO ABUSE   Anticoagulant long-term use   Abdominal pain, chronic, epigastric   Splenic vein thrombosis   Alcohol withdrawal   Anemia   Hypokalemia   Substance abuse   Bipolar disorder   GERD (gastroesophageal reflux disease)   Depression   UTI (urinary tract infection)    Time spent: 35 minutes    [facility] Pager [phone]. If 7PM-7AM, please contact night-coverage at [email or web address], password [facility] 09/26/2014, 11:36 AM  LOS: 3 days

## 2014-09-26 NOTE — Progress Notes (Signed)
    Subjective: Still with abdominal pain but tolerating diet. Nausea underlying but no vomiting. Ate [employer] yesterday and Janine Limbo the day before.   Objective: Vital signs in last 24 hours: Temp:  [97.8 F (36.6 C)-98.4 F (36.9 C)] 97.8 F (36.6 C) (09/30 0655) Pulse Rate:  [80-104] 85 (09/30 0655) Resp:  [18-20] 20 (09/30 0655) BP: (110-125)/(66-87) 110/66 mmHg (09/30 0655) SpO2:  [99 %-100 %] 100 % (09/30 0655) Last BM Date: 09/24/14 General:   Alert and oriented, pleasant, jaundice improving Head:  Normocephalic and atraumatic. Eyes:  Mild scleral icterus Abdomen:  Bowel sounds present, soft, mild TTP RUQ and lower abdomen, non-distended. No HSM or hernias noted. No rebound or guarding. No masses appreciated  Msk:  Symmetrical without gross deformities. Normal posture. Extremities:  Without edema. Neurologic:  Alert and  oriented x4;  grossly normal neurologically. Psych:  Alert and cooperative. Normal mood and affect.  Intake/Output from previous day: 09/29 0701 - 09/30 0700 In: 1140 [P.O.:720; I.V.:420] Out: -  Intake/Output this shift:    Lab Results:  Recent Labs  09/24/14 0538 09/25/14 0534 09/26/14 0534  WBC 5.9 5.4 6.1  HGB 9.8* 8.7* 9.9*  HCT 30.1* 27.0* 30.5*  PLT 429* 350 394   BMET  Recent Labs  09/23/14 1020 09/24/14 0538 09/25/14 0534  NA 135* 140 139  K 3.2* 2.9* 3.4*  CL 92* 99 103  CO2 23 28 25   GLUCOSE 103* 78 96  BUN 7 7 5*  CREATININE 0.67 0.78 0.78  CALCIUM 9.3 8.2* 7.7*   LFT  Recent Labs  09/23/14 1953 09/24/14 0538 09/25/14 0534 09/26/14 0533  PROT  --  6.6 5.7* 6.6  ALBUMIN  --  2.8* 2.4* 2.6*  AST  --  510* 451* 558*  ALT  --  326* 290* 352*  ALKPHOS  --  284* 218* 242*  BILITOT  --  8.4* 5.8* 5.1*  BILIDIR 5.0*  --  4.4* 3.7*  IBILI  --   --  1.4* 1.4*   PT/INR  Recent Labs  09/25/14 0534 09/26/14 0534  LABPROT 18.4* 18.1*  INR 1.53* 1.50*   Hepatitis Panel  Recent Labs   09/24/14 0538  HEPBSAG NEGATIVE  HCVAB Reactive*  HEPAIGM NON REACTIVE  HEPBIGM NON REACTIVE    Assessment: 23 year old female admitted with acute hepatitis in the setting of ETOH abuse and also noted to be Hep C antibody positive. Discriminant function less than 30 on admission, and LFTs improved overall since admission. As of note, history of HIV antibody positive in past with subsequent negative confirmation test; HIV antibody negative this admission. Supportive measures indicated at this time. HCV RNA with reflex genotype pending. . Discussed alcohol cessation with patient, including need for alcohol and drug cessation for at least 6 months prior to treatment for Hepatitis C. She is agreeable to this.   Anemia: no overt signs of GI bleeding. Multifactorial. Anemia panel ordered with normal ferritin, elevated iron.   Splenic vein thrombosis: with known antiphospholipid syndrome. Anticoagulation per pharmacy.   Diarrhea: resolved. Check stool studies if recurs.   Plan: PPI for GI prophylaxis Follow-up on pending HCV RNA with reflex genotype Follow LFTs Supportive care Stable from a GI standpoint Close outpatient follow-up Consideration for Hep C treatment after drug/ETOH cessation for at least 6 months   Orvil Feil, ANP-BC [facility]    LOS: 3 days    09/26/2014, 9:18 AM

## 2014-09-26 NOTE — Progress Notes (Signed)
ANTICOAGULATION CONSULT NOTE  Pharmacy Consult for Coumadin Indication: Splenic vein partial thrombosis                     Lupus anticoagulant disorder  No Known Allergies  Patient Measurements: Height: 5\' 7"  (170.2 cm) Weight: 160 lb (72.576 kg) IBW/kg (Calculated) : 61.6  Vital Signs: Temp: 97.8 F (36.6 C) (09/30 0655) Temp src: Oral (09/30 0655) BP: 110/66 mmHg (09/30 0655) Pulse Rate: 85 (09/30 0655)  Labs:  Recent Labs  09/23/14 1020  09/23/14 1721 09/24/14 0538 09/25/14 0534 09/26/14 0534  HGB 12.0  --   --  9.8* 8.7* 9.9*  HCT 35.3*  --   --  30.1* 27.0* 30.5*  PLT 544*  --   --  429* 350 394  APTT  --   --  29  --   --   --   LABPROT  --   < > 15.0 16.1* 18.4* 18.1*  INR  --   < > 1.18 1.29 1.53* 1.50*  CREATININE 0.67  --   --  0.78 0.78  --   < > = values in this interval not displayed. Estimated Creatinine Clearance: 105.4 ml/min (by C-G formula based on Cr of 0.78).  Medical History: Past Medical History  Diagnosis Date  . Pulmonary embolism 06/2010    Multiple bilateral pulmonary emboli diagnosed by CT scan in in 06/2010.  Moderate size pleural effusion and right lower lobe infarction present at that time.  Lupus anticoagulant was present, but all other aspects of her hypercoagulable evaluation were negative including all Cardiolite and antibodies.    . Anticoagulant long-term use   . Lupus anticoagulant disorder     Previously followed by Dr. Lieutenant Diego of discharged from his care due to medical noncompliance.  Marland Kitchen HIV positive     Western Blot negative  . Tobacco abuse   . Anemia   . Substance abuse     cocaine, opiates, marijuana; alcohol; most recent positive test for cocaine was in December of 2011; alcohol levels have been as high as 302  . Anxiety and depression     suicide attempt-age 23  . Attention deficit hyperactivity disorder   . Asthma     ABG in 11/2010:7.45, 34, 79., ventolin rescue inhaler last use 03/21/12  . Anxiety   .  Panic attacks     not on meds  . Bipolar disorder     not on meds  . Abnormal Pap smear   . GERD (gastroesophageal reflux disease)   . Recurrent upper respiratory infection (URI)   . Cough productive of purulent sputum   . Edema in pregnancy   . Shortness of breath     has asthma- scheduled for PFTs on 04/06/12  . Dermatitis due to detergents   . Antiphospholipid antibody with hypercoagulable state   . Depression     suicide attempt at age 23   Medications:  Scheduled:  . sodium chloride  1,000 mL Intravenous Once  . ciprofloxacin  250 mg Oral BID  . enoxaparin (LOVENOX) injection  1.5 mg/kg Subcutaneous Q24H  . folic acid  1 mg Oral Daily  . multivitamin with minerals  1 tablet Oral Daily  . nicotine  21 mg Transdermal Daily  . pantoprazole  40 mg Oral Daily  . thiamine  100 mg Oral Daily   Or  . thiamine  100 mg Intravenous Daily  . warfarin  10 mg Oral Once  . Warfarin - Pharmacist Dosing  Inpatient   Does not apply Q24H   No prescriptions prior to admission   Assessment: 23 yoF admitted with Lupus anticoagulant disorder, splenic vein partial thrombosis in patient with Hepatitis C.  Patient without insurance so planning for Lovenox bridging to warfarin.  Lovenox treatment dosage started by MD.  Day#4 Lovenox-->Warfarin overlap.  INR rising toward goal but has stalled. H/H better this AM, no bleeding noted.  Pt has been started on Cipro which can interact with Coumadin to increase INR.  Goal of Therapy:  INR 2-3 Monitor platelets by anticoagulation protocol: Yes   Plan:   Coumadin 10 mg po x 1 today  INR daily  Continue Lovenox for a minimum of 5 days & until INR therapeutic x24hrs  Monitor CBC   Education done (has been on warfarin in the past).  [name], [name] A 09/26/2014,8:08 AM

## 2014-09-27 ENCOUNTER — Encounter: Payer: Self-pay | Admitting: Gastroenterology

## 2014-09-27 DIAGNOSIS — R17 Unspecified jaundice: Secondary | ICD-10-CM

## 2014-09-27 DIAGNOSIS — D735 Infarction of spleen: Secondary | ICD-10-CM

## 2014-09-27 DIAGNOSIS — K7200 Acute and subacute hepatic failure without coma: Secondary | ICD-10-CM

## 2014-09-27 DIAGNOSIS — Z720 Tobacco use: Secondary | ICD-10-CM

## 2014-09-27 LAB — BASIC METABOLIC PANEL
ANION GAP: 10 (ref 5–15)
BUN: 9 mg/dL (ref 6–23)
CO2: 28 mEq/L (ref 19–32)
CREATININE: 0.81 mg/dL (ref 0.50–1.10)
Calcium: 8.3 mg/dL — ABNORMAL LOW (ref 8.4–10.5)
Chloride: 102 mEq/L (ref 96–112)
Glucose, Bld: 99 mg/dL (ref 70–99)
Potassium: 3.6 mEq/L — ABNORMAL LOW (ref 3.7–5.3)
Sodium: 140 mEq/L (ref 137–147)

## 2014-09-27 LAB — PROTIME-INR
INR: 1.57 — AB (ref 0.00–1.49)
PROTHROMBIN TIME: 18.8 s — AB (ref 11.6–15.2)

## 2014-09-27 LAB — URINE CULTURE: Colony Count: 10000

## 2014-09-27 MED ORDER — PANTOPRAZOLE SODIUM 40 MG PO TBEC: 40.0000 mg | DELAYED_RELEASE_TABLET | Freq: Every day | ORAL | Status: DC

## 2014-09-27 MED ORDER — WARFARIN SODIUM 5 MG PO TABS: 15.0000 mg | ORAL_TABLET | Freq: Once | ORAL | Status: DC

## 2014-09-27 MED ORDER — LORAZEPAM 1 MG PO TABS
1.0000 mg | ORAL_TABLET | Freq: Four times a day (QID) | ORAL | Status: DC | PRN
  Administered 2014-09-27 (×2): 1 mg via ORAL
  Filled 2014-09-27 (×2): qty 1

## 2014-09-27 MED ORDER — RIVAROXABAN (XARELTO) EDUCATION KIT FOR DVT/PE PATIENTS
PACK | Freq: Once | Status: AC
  Administered 2014-09-27: 11:00:00
  Filled 2014-09-27: qty 1

## 2014-09-27 MED ORDER — OXYCODONE HCL 5 MG PO TABS
5.0000 mg | ORAL_TABLET | Freq: Three times a day (TID) | ORAL | Status: DC | PRN
  Administered 2014-09-27: 5 mg via ORAL
  Filled 2014-09-27: qty 1

## 2014-09-27 MED ORDER — DIPHENHYDRAMINE HCL 25 MG PO CAPS
25.0000 mg | ORAL_CAPSULE | Freq: Three times a day (TID) | ORAL | Status: DC | PRN
  Administered 2014-09-27: 25 mg via ORAL
  Filled 2014-09-27: qty 1

## 2014-09-27 MED ORDER — RIVAROXABAN 15 MG PO TABS: 15.0000 mg | ORAL_TABLET | Freq: Two times a day (BID) | ORAL | Status: DC

## 2014-09-27 MED ORDER — NICOTINE 21 MG/24HR TD PT24: 21.0000 mg | MEDICATED_PATCH | Freq: Every day | TRANSDERMAL | Status: DC

## 2014-09-27 MED ORDER — FOLIC ACID 1 MG PO TABS: 1.0000 mg | ORAL_TABLET | Freq: Every day | ORAL | Status: DC

## 2014-09-27 MED ORDER — KETOROLAC TROMETHAMINE 10 MG PO TABS: 10.0000 mg | ORAL_TABLET | Freq: Four times a day (QID) | ORAL | Status: DC | PRN

## 2014-09-27 MED ORDER — RIVAROXABAN 20 MG PO TABS: 20.0000 mg | ORAL_TABLET | Freq: Every day | ORAL | Status: DC

## 2014-09-27 MED ORDER — LORAZEPAM 2 MG/ML IJ SOLN: 1.0000 mg | Freq: Four times a day (QID) | INTRAMUSCULAR | Status: DC | PRN

## 2014-09-27 MED ORDER — POTASSIUM CHLORIDE CRYS ER 20 MEQ PO TBCR
40.0000 meq | EXTENDED_RELEASE_TABLET | ORAL | Status: AC
  Administered 2014-09-27 (×2): 40 meq via ORAL
  Filled 2014-09-27 (×3): qty 2

## 2014-09-27 MED ORDER — CIPROFLOXACIN HCL 250 MG PO TABS: 250.0000 mg | ORAL_TABLET | Freq: Two times a day (BID) | ORAL | Status: DC

## 2014-09-27 NOTE — Discharge Instructions (Signed)
Information on my medicine - XARELTO (rivaroxaban)  This medication education was reviewed with me or my healthcare representative as part of my discharge preparation.  The pharmacist that spoke with me during my hospital stay was:  Ena Dawley, [place]? Xarelto was prescribed to treat blood clots that may have been found in the veins of your legs (deep vein thrombosis) or in your lungs (pulmonary embolism) and to reduce the risk of them occurring again.  What do you need to know about Xarelto? The starting dose is one 15 mg tablet taken TWICE daily with food for the FIRST 21 DAYS then on (enter date)  10/18/14  the dose is changed to one 20 mg tablet taken ONCE A DAY with your evening meal.  DO NOT stop taking Xarelto without talking to the health care provider who prescribed the medication.  Refill your prescription for 20 mg tablets before you run out.  After discharge, you should have regular check-up appointments with your healthcare provider that is prescribing your Xarelto.  In the future your dose may need to be changed if your kidney function changes by a significant amount.  What do you do if you miss a dose? If you are taking Xarelto TWICE DAILY and you miss a dose, take it as soon as you remember. You may take two 15 mg tablets (total 30 mg) at the same time then resume your regularly scheduled 15 mg twice daily the next day.  If you are taking Xarelto ONCE DAILY and you miss a dose, take it as soon as you remember on the same day then continue your regularly scheduled once daily regimen the next day. Do not take two doses of Xarelto at the same time.   Important Safety Information Xarelto is a blood thinner medicine that can cause bleeding. You should call your healthcare provider right away if you experience any of the following:   Bleeding from an injury or your nose that does not stop.   Unusual colored urine (red or dark brown) or  unusual colored stools (red or black).   Unusual bruising for unknown reasons.   A serious fall or if you hit your head (even if there is no bleeding).  Some medicines may interact with Xarelto and might increase your risk of bleeding while on Xarelto. To help avoid this, consult your healthcare provider or pharmacist prior to using any new prescription or non-prescription medications, including herbals, vitamins, non-steroidal anti-inflammatory drugs (NSAIDs) and supplements.  This website has more information on Xarelto: [email or web address]

## 2014-09-27 NOTE — Progress Notes (Signed)
[facility] PROGRESS NOTE  [name] ERD:[id number] DOB: 1991-01-14 DOA: 09/23/2014 PCP: No primary provider on file.  Assessment/Plan: #1 partial splenic vein thrombosis - in the setting of antiphospholipid syndrome. Hx of multiple PE's and DVT's per chart. Coumadin per pharmacy. Patient hx non-compliance. INR remains sub-therapeutic but improving. Discussed with pharmacy who opines she may be coumadin resistant. Will increase dose to 15mg  today. Continue lovenox bridge. Instruct patient for lovenox administration that she will likely need for a while at discharge. [facility].   #2 acute hepatitis -Likely alcoholic. AST/ALT trending downward. Of note, she has had positive screening test for HIV in the past, however the confirmation test ruled this out. Some nausea no vomiting. Complains of continued RUQ pain. Tolerating fast food brought to her by visitors. Somewhat lethargic on exam. GI evaluated and opines stable and will need close OP follow up. Will decrease benadryl and pain med.   #3 alcohol withdrawal -No s/sx withdrawal at this time. CIWA protocol. Social work consult. Folate and B12 high.   #4. Hypokalemia: related to diarrhea and decreased po intake. Mag level i.4. Will replete and recheck.   #5. Asthma: remains stable at baseline. Chart review indicates Advair in past.   #6. Tobacco use: cessation counseling. Nicotine patch  #7. Depression/anxiety: stable at baseline  #8. GERD: stable at baseline. PPI  #9. Substance abuse: social work consult  #10. Anemia. Hx of same. Iron and B12 high normal ferritin. Current Hg at baseline. No s/sx bleeding monitor   Code Status: full Family Communication: none present Disposition Plan: home hopefully tomorrow   Consultants:  none  Procedures:  none  Antibiotics:  none  HPI/Subjective: Awake alert. Reports feeling better and wanting to go home.   Objective: Filed Vitals:   09/27/14 0700  BP:  105/62  Pulse: 81  Temp: 98.7 F (37.1 C)  Resp:     Intake/Output Summary (Last 24 hours) at 09/27/14 0903 Last data filed at 09/26/14 1700  Gross per 24 hour  Intake      0 ml  Output    900 ml  Net   -900 ml   Filed Weights   09/23/14 1021 09/23/14 1814  Weight: 72.576 kg (160 lb) 72.576 kg (160 lb)    Exam:   General:  Well nourished NAD  Cardiovascular: RRR No MGR No LE edema  Respiratory: normal effort BS clear bilaterally no crackles  Abdomen: non-distended soft +BS non-tender to palpation  Musculoskeletal: no clubbing or cyanosis   Data Reviewed: Basic Metabolic Panel:  Recent Labs Lab 09/24/14 0538 09/25/14 0534 09/26/14 0533 09/26/14 2234 09/27/14 0515  NA 140 139 137 135* 140  K 2.9* 3.4* 3.3* 4.2 3.6*  CL 99 103 98 99 102  CO2 28 25 26 29 28   GLUCOSE 78 96 143* 98 99  BUN 7 5* 5* 7 9  CREATININE 0.78 0.78 0.81 0.83 0.81  CALCIUM 8.2* 7.7* 8.2* 8.4 8.3*  MG  --  1.4*  --  1.8  --   PHOS  --   --   --  3.8  --    Liver Function Tests:  Recent Labs Lab 09/23/14 1020 09/24/14 0538 09/25/14 0534 09/26/14 0533 09/26/14 2234  AST 925* 510* 451* 558* 616*  ALT 500* 326* 290* 352* 397*  ALKPHOS 397* 284* 218* 242* 236*  BILITOT 10.7* 8.4* 5.8* 5.1* 3.8*  PROT 8.5* 6.6 5.7* 6.6 6.6  ALBUMIN 3.8 2.8* 2.4* 2.6* 2.6*  Recent Labs Lab 09/23/14 1020  LIPASE 19   No results found for this basename: AMMONIA,  in the last 168 hours CBC:  Recent Labs Lab 09/23/14 1020 09/24/14 0538 09/25/14 0534 09/26/14 0534 09/26/14 2234  WBC 7.5 5.9 5.4 6.1 5.8  NEUTROABS 3.6  --   --   --   --   HGB 12.0 9.8* 8.7* 9.9* 9.7*  HCT 35.3* 30.1* 27.0* 30.5* 29.8*  MCV 81.0 83.6 84.9 84.7 84.9  PLT 544* 429* 350 394 362   Cardiac Enzymes: No results found for this basename: CKTOTAL, CKMB, CKMBINDEX, TROPONINI,  in the last 168 hours BNP (last 3 results) No results found for this basename: PROBNP,  in the last 8760 hours CBG: No results found  for this basename: GLUCAP,  in the last 168 hours  No results found for this or any previous visit (from the past 240 hour(s)).   Studies: No results found.  Scheduled Meds: . sodium chloride  1,000 mL Intravenous Once  . ciprofloxacin  250 mg Oral BID  . enoxaparin (LOVENOX) injection  1.5 mg/kg Subcutaneous Q24H  . folic acid  1 mg Oral Daily  . multivitamin with minerals  1 tablet Oral Daily  . nicotine  21 mg Transdermal Daily  . pantoprazole  40 mg Oral Daily  . potassium chloride  40 mEq Oral Q4H  . thiamine  100 mg Oral Daily   Or  . thiamine  100 mg Intravenous Daily  . Warfarin - Pharmacist Dosing Inpatient   Does not apply Q24H   Continuous Infusions:   Principal Problem:   Acute hepatitis Active Problems:   TOBACCO ABUSE   Anticoagulant long-term use   Abdominal pain, chronic, epigastric   Splenic vein thrombosis   Alcohol withdrawal   Anemia   Hypokalemia   Substance abuse   Bipolar disorder   GERD (gastroesophageal reflux disease)   Depression   UTI (urinary tract infection)    Time spent: 35 minutes    [facility] Pager [phone]. If 7PM-7AM, please contact night-coverage at [email or web address], password [facility] 09/27/2014, 9:03 AM  LOS: 4 days

## 2014-09-27 NOTE — Progress Notes (Signed)
Patient discharge teaching given, including activity, diet, follow-up appoints, and medications. Patient verbalized understanding of all discharge instructions. IV access was d/c'd. Vitals are stable. Skin is intact except as charted in most recent assessments. Pt walked out wit boyfriend at her side,, to be driven home by family. George Hugh D, RN

## 2014-09-27 NOTE — Discharge Summary (Signed)
Pt seen and examined. Agree with above discharge summary. Briefly, pt presents with acute partial splenic vein thrombosis in the setting of antiphospholipid syndrome. Initially planned on therapeutic coumadin, but to be transitioned to xarelto on discharge. Pt remains stable for discharge with close outpatient follow up.

## 2014-09-27 NOTE — Progress Notes (Addendum)
ANTICOAGULATION CONSULT NOTE  Pharmacy Consult for Coumadin Indication: Splenic vein partial thrombosis                     Lupus anticoagulant disorder  No Known Allergies  Patient Measurements: Height: 5\' 7"  (170.2 cm) Weight: 160 lb (72.576 kg) IBW/kg (Calculated) : 61.6  Vital Signs: Temp: 98.7 F (37.1 C) (10/01 0700) Temp src: Oral (10/01 0700) BP: 105/62 mmHg (10/01 0700) Pulse Rate: 81 (10/01 0700)  Labs:  Recent Labs  09/25/14 0534 09/26/14 0533 09/26/14 0534 09/26/14 2234 09/27/14 0515  HGB 8.7*  --  9.9* 9.7*  --   HCT 27.0*  --  30.5* 29.8*  --   PLT 350  --  394 362  --   LABPROT 18.4*  --  18.1*  --  18.8*  INR 1.53*  --  1.50*  --  1.57*  CREATININE 0.78 0.81  --  0.83 0.81   Estimated Creatinine Clearance: 104.1 ml/min (by C-G formula based on Cr of 0.81).  Medical History: Past Medical History  Diagnosis Date  . Pulmonary embolism 06/2010    Multiple bilateral pulmonary emboli diagnosed by CT scan in in 06/2010.  Moderate size pleural effusion and right lower lobe infarction present at that time.  Lupus anticoagulant was present, but all other aspects of her hypercoagulable evaluation were negative including all Cardiolite and antibodies.    . Anticoagulant long-term use   . Lupus anticoagulant disorder     Previously followed by Dr. Lieutenant Diego of discharged from his care due to medical noncompliance.  Marland Kitchen HIV positive     Western Blot negative  . Tobacco abuse   . Anemia   . Substance abuse     cocaine, opiates, marijuana; alcohol; most recent positive test for cocaine was in December of 2011; alcohol levels have been as high as 302  . Anxiety and depression     suicide attempt-age 41  . Attention deficit hyperactivity disorder   . Asthma     ABG in 11/2010:7.45, 34, 79., ventolin rescue inhaler last use 03/21/12  . Anxiety   . Panic attacks     not on meds  . Bipolar disorder     not on meds  . Abnormal Pap smear   . GERD  (gastroesophageal reflux disease)   . Recurrent upper respiratory infection (URI)   . Cough productive of purulent sputum   . Edema in pregnancy   . Shortness of breath     has asthma- scheduled for PFTs on 04/06/12  . Dermatitis due to detergents   . Antiphospholipid antibody with hypercoagulable state   . Depression     suicide attempt at age 60   Medications:  Scheduled:  . sodium chloride  1,000 mL Intravenous Once  . ciprofloxacin  250 mg Oral BID  . enoxaparin (LOVENOX) injection  1.5 mg/kg Subcutaneous Q24H  . folic acid  1 mg Oral Daily  . multivitamin with minerals  1 tablet Oral Daily  . nicotine  21 mg Transdermal Daily  . pantoprazole  40 mg Oral Daily  . potassium chloride  40 mEq Oral Q4H  . thiamine  100 mg Oral Daily   Or  . thiamine  100 mg Intravenous Daily  . warfarin  15 mg Oral Once  . Warfarin - Pharmacist Dosing Inpatient   Does not apply Q24H   No prescriptions prior to admission   Assessment: 22 yoF admitted with Lupus anticoagulant disorder, splenic vein partial thrombosis  in patient with Hepatitis C.  Patient without insurance so planning for Lovenox bridging to warfarin.  Lovenox treatment dosage started by MD.   Day#5 Lovenox-->Warfarin overlap.  INR rising toward goal but has stalled.  No bleeding noted.  Pt has been started on Cipro which can interact with Coumadin to increase INR.  Pt most likely Coumadin resistant. Discussed with Dyanne Carrel NP.  Goal of Therapy:  INR 2-3 Monitor platelets by anticoagulation protocol: Yes   Plan:   Coumadin 15 mg po x 1 today  INR daily  Continue Lovenox until INR therapeutic x 24hrs  Monitor CBC   Education done (has been on warfarin in the past).  [place] Crane, Altheia Shafran A 09/27/2014,9:11 AM

## 2014-09-27 NOTE — Care Management Note (Signed)
    Page 1 of 1   09/27/2014     11:25:27 AM CARE MANAGEMENT NOTE 09/27/2014  Patient:  [name], [name]   Account Number:  [id number]  Date Initiated:  09/27/2014  Documentation initiated by:  Theophilus Kinds  Subjective/Objective Assessment:   Pt admitted from home with splenic thrombosis. Pt lives with her significant other and will return home at discharge. Pt is independent with ADL's. Pt has no insurance and PCP.     Action/Plan:   PCP arranged with [facility] and documented on AVS. Financial counselor has contacted pt about self pay status. Pt d/c'd on Xarelto and was given 30 days free card and pt assistance forms filled out and faxed to [employer].   Anticipated DC Date:  09/27/2014   Anticipated DC Plan:  HOME/SELF CARE  In-house referral  Financial Counselor      DC Planning Services  CM consult      Choice offered to / List presented to:             Status of service:  Completed, signed off Medicare Important Message given?   (If response is "NO", the following Medicare IM given date fields will be blank) Date Medicare IM given:   Medicare IM given by:   Date Additional Medicare IM given:   Additional Medicare IM given by:    Discharge Disposition:  HOME/SELF CARE  Per UR Regulation:    If discussed at Long Length of Stay Meetings, dates discussed:    Comments:  09/27/14 [place], RN BSN CM Pt given number to [employer] so she can follow up early next week in reference to approval for 12 months free. Pt verbalized understanding.

## 2014-09-27 NOTE — Progress Notes (Signed)
Pt seen and examined. Please review discharge summary from today.

## 2014-09-27 NOTE — Progress Notes (Signed)
Patient given education booklet on xarelto,  Educated on risk of bleeding and when to report to MD.  [name] understanding.

## 2014-09-27 NOTE — Progress Notes (Signed)
ANTICOAGULATION CONSULT NOTE - Initial Consult  Pharmacy Consult for Xarelto Indication: Splenic Vein thrombosis, Lupus anticoagulant d/o          H/O multiple PE's and DVT's  No Known Allergies  Patient Measurements: Height: 5\' 7"  (170.2 cm) Weight: 160 lb (72.576 kg) IBW/kg (Calculated) : 61.6  Vital Signs: Temp: 98.7 F (37.1 C) (10/01 0700) Temp src: Oral (10/01 0700) BP: 105/62 mmHg (10/01 0700) Pulse Rate: 81 (10/01 0700)  Labs:  Recent Labs  09/25/14 0534 09/26/14 0533 09/26/14 0534 09/26/14 2234 09/27/14 0515  HGB 8.7*  --  9.9* 9.7*  --   HCT 27.0*  --  30.5* 29.8*  --   PLT 350  --  394 362  --   LABPROT 18.4*  --  18.1*  --  18.8*  INR 1.53*  --  1.50*  --  1.57*  CREATININE 0.78 0.81  --  0.83 0.81   Estimated Creatinine Clearance: 104.1 ml/min (by C-G formula based on Cr of 0.81).  Medical History: Past Medical History  Diagnosis Date  . Pulmonary embolism 06/2010    Multiple bilateral pulmonary emboli diagnosed by CT scan in in 06/2010.  Moderate size pleural effusion and right lower lobe infarction present at that time.  Lupus anticoagulant was present, but all other aspects of her hypercoagulable evaluation were negative including all Cardiolite and antibodies.    . Anticoagulant long-term use   . Lupus anticoagulant disorder     Previously followed by Dr. Lieutenant Diego of discharged from his care due to medical noncompliance.  Marland Kitchen HIV positive     Western Blot negative  . Tobacco abuse   . Anemia   . Substance abuse     cocaine, opiates, marijuana; alcohol; most recent positive test for cocaine was in December of 2011; alcohol levels have been as high as 302  . Anxiety and depression     suicide attempt-age 23  . Attention deficit hyperactivity disorder   . Asthma     ABG in 11/2010:7.45, 34, 79., ventolin rescue inhaler last use 03/21/12  . Anxiety   . Panic attacks     not on meds  . Bipolar disorder     not on meds  . Abnormal Pap smear    . GERD (gastroesophageal reflux disease)   . Recurrent upper respiratory infection (URI)   . Cough productive of purulent sputum   . Edema in pregnancy   . Shortness of breath     has asthma- scheduled for PFTs on 04/06/12  . Dermatitis due to detergents   . Antiphospholipid antibody with hypercoagulable state   . Depression     suicide attempt at age 23   Assessment: 23 yoF admitted with Lupus anticoagulant disorder, splenic vein partial thrombosis in patient with Hepatitis C. Per report, pt has had multiple PE's and DVT's in the past.    Lovenox treatment dosage started by MD on admission.  Day#5 Lovenox-->Warfarin overlap. INR rising toward goal but has stalled. No bleeding noted. Pt most likely Coumadin resistant.  Discussed with Dyanne Carrel NP.  Plan is to d/c Lovenox and Coumadin and transition to [place].  Goal of Therapy: Full anticoagulation with Xarelto, treatment dose Monitor platelets by anticoagulation protocol: Yes   Plan:  Xarelto 15mg  po BID x 21 days then Xarelto 20mg  PO daily thereafter Xarelto education  [place], Trust Crago A 09/27/2014,11:13 AM

## 2014-09-27 NOTE — Discharge Summary (Signed)
Physician Discharge Summary  [name] PPJ:[id number] DOB: September 07, 1991 DOA: 09/23/2014  PCP: No primary provider on file.  Admit date: 09/23/2014 Discharge date: 09/27/2014  Time spent: 40 minutes  Recommendations for Outpatient Follow-up:  1. PCP at [facility] 10/7. Needs to get established with PCP.  2. GI office to contact for follow up on pending HCV RNA with reflex genotype and LFT's. Note also indicates consideration for Hep C treatment after drug/ETOH cessation for at least 6 months.  Discharge Diagnoses:  Principal Problem:   Acute hepatitis Active Problems:   TOBACCO ABUSE   Anticoagulant long-term use   Abdominal pain, chronic, epigastric   Splenic vein thrombosis   Alcohol withdrawal   Anemia   Hypokalemia   Substance abuse   Bipolar disorder   GERD (gastroesophageal reflux disease)   Depression   UTI (urinary tract infection)   Discharge Condition: stable  Diet recommendation: regular  Filed Weights   09/23/14 1021 09/23/14 1814  Weight: 72.576 kg (160 lb) 72.576 kg (160 lb)    History of present illness:  With a history of lupus anticoagulant / antiphospholipid antibody syndrome who is currently untreated who presented to emergency department on 09/23/14 with abdominal pain that started at approximately 9 AM the prior morning. The pain  located in her left and right flanks and nonradiating. It was accompanied by nausea, vomiting, diarrhea. It had progressed since that time. She characterized the pain as sharp and constant.  Additionally, she admited to drinking alcohol - approximately one bottle of a day and had been doing this for several months. When she stops drinking she develops tremors. She denied [name] out episodes or the other episodes of loss of consciousness. Her last drink was yesterday evening but has not had anything to drink today  Hospital Course:  #1 partial splenic vein thrombosis - in the setting of antiphospholipid syndrome. Hx of multiple  PE's and DVT's per chart. Provided with Coumadin and lovenox bridge. INR trended up but stalled. Patient hx non-compliance. INR remained sub-therapeutic. Decided to transition to xarelto at discharge. Provided with education and vouchers. [facility].   #2 acute hepatitis -Likely alcoholic. AST/ALT trending downward at discharge. Of note, she has had positive screening test for HIV in the past, however the confirmation test ruled this out. GI evaluated and opined stable and will need close OP follow up. See above for follow up. i left message at GI office to contact patient.    #3 alcohol withdrawal -No s/sx withdrawal. Folate and B12 high.  #4. Hypokalemia: resolved at discharge  #5. Asthma: remained stable at baseline. Chart review indicates Advair in past.  #6. Tobacco use: cessation counseling. Nicotine patch  #7. Depression/anxiety: stable at baseline  #8. GERD: stable at baseline. PPI  #9. Substance abuse: social work consult  #10. Anemia. Hx of same. Iron and B12 high normal ferritin. Current Hg at baseline. No s/sx bleeding  #11. UTI. Cipro. Urine culture multiple bacteria present none predominant. Afebrile and non-toxic appearign  Procedures:  none  Consultations:  GI  Discharge Exam: Filed Vitals:   09/27/14 0700  BP: 105/62  Pulse: 81  Temp: 98.7 F (37.1 C)  Resp:     General: well nourished appears comfortable Cardiovascular: RRR No MGR No LE edema Respiratory: normal effort BS clear bilaterally no wheeze Abdomen: soft +BS mild diffuse tenderness  Discharge Instructions You were cared for by a hospitalist during your hospital stay. If you have any questions about your  discharge medications or the care you received while you were in the hospital after you are discharged, you can call the unit and asked to speak with the hospitalist on call if the hospitalist that took care of you is not available. Once you are discharged, your primary care physician  will handle any further medical issues. Please note that NO REFILLS for any discharge medications will be authorized once you are discharged, as it is imperative that you return to your primary care physician (or establish a relationship with a primary care physician if you do not have one) for your aftercare needs so that they can reassess your need for medications and monitor your lab values.   Current Discharge Medication List    START taking these medications   Details  ketorolac (TORADOL) 10 MG tablet Take 1 tablet (10 mg total) by mouth every 6 (six) hours as needed for moderate pain or severe pain. Qty: 20 tablet, Refills: 0       No Known Allergies Follow-up Information   Follow up with Alphia Kava On 10/03/2014. (at 9:00)    Contact information:   [place] [address] [phone]       Follow up with Laban Emperor, NP.   Specialty:  Gastroenterology   Contact information:   [address] [place] [address] [phone]        The results of significant diagnostics from this hospitalization (including imaging, microbiology, ancillary and laboratory) are listed below for reference.    Significant Diagnostic Studies: US Abdomen Complete  09/23/2014   CLINICAL DATA:  Right upper quadrant pain. Acute onset of jaundice. History of lupus.  EXAM: ULTRASOUND ABDOMEN COMPLETE  COMPARISON:  11/23/2005  FINDINGS: Gallbladder:  Echogenic structure along the gallbladder wall measures 0.5 cm. This structure is non mobile and suggestive for a gallbladder polyp. No definite gallstones. Reportedly, the patient has a sonographic Murphy's sign.  Common bile duct:  Diameter: 0.4 cm  Liver:  Liver parenchyma is within normal limits. The main portal vein is patent. No focal liver lesion.  IVC:  No abnormality visualized.  Pancreas:  There is echogenic material within the splenic vein near the portal confluence. Findings are suggestive for venous thrombus. The  adjacent pancreas is mildly heterogeneous without duct dilatation.  Spleen:  Measures roughly 10.1 cm in length and the splenic volume is 330 mL.  Right Kidney:  Length: 11.5 cm. No evidence hydronephrosis. There are echogenic foci in the lower pole which could represent stones. In addition, there is a round hypoechoic structure in the right kidney that roughly measures 1.1 cm and probably represents a cyst.  Left Kidney:  Length: 11.9 cm. Echogenicity within normal limits. No mass or hydronephrosis visualized.  Abdominal aorta:  No aneurysm visualized.  Other findings:  None.  IMPRESSION: Echogenic material in the splenic vein near the portal vein confluence. Findings are concerning for venous thrombus. Recommend further evaluation with a CT with intravenous contrast.  Gallbladder polyp measuring 0.5 cm.  Probable right renal cyst.  Echogenic foci in the right kidney could represent kidney stones. Negative for hydronephrosis.  These results were called by telephone at the time of interpretation on 09/23/2014 at 1:30 pm to Dr. Rolland Porter , who verbally acknowledged these results.   Electronically Signed   By: Markus Daft M.D.   On: 09/23/2014 13:32   Ct Abdomen Pelvis W Contrast  09/23/2014   CLINICAL DATA:  Right-sided abdominal pain.  Vomiting.  Jaundice.  EXAM: CT ABDOMEN AND PELVIS WITH CONTRAST  TECHNIQUE: Multidetector CT imaging of the abdomen and pelvis was performed using the standard protocol following bolus administration of intravenous contrast.  CONTRAST:  8mL OMNIPAQUE IOHEXOL 300 MG/ML SOLN, 174mL OMNIPAQUE IOHEXOL 300 MG/ML SOLN  COMPARISON:  Ultrasound 09/23/2014.  CT chest 09/06/2010.  FINDINGS: Diffuse fatty infiltration of the liver. Focal area of low density noted in the anterior aspect of the liver adjacent to the falciform ligament is noted. This most likely represents an area of focal fatty infiltration or benign lesion such as hemangioma. Similar finding was noted on prior see CT chest  study of 09/06/2010 but is better demonstrated on today's abdominal exam. Spleen is normal. Nonocclusive splenic vein thrombosis is noted. The portal vein patent. Hepatic veins are patent.  Adrenals are unremarkable. Tiny lucencies in the kidneys, most likely simple cysts. No hydronephrosis. No evidence of obstructing ureteral stone. Bladder is nondistended. Mild bladder wall thickening noted. This could be related to bladder wall pathology. This may be related to lack of distention of the bladder. Uterus is prominent. Bilateral tubal ligations. Small amount of free pelvic fluid. This is nonspecific.  No significant adenopathy. Abdominal aorta normal caliber. The visceral vessels are patent.  Appendix is normal. No evidence of bowel distention. No free air. Stomach is nondistended. No mesenteric mass. No significant abdominal wall hernia.  Heart size normal. Lung bases clear of infiltrates. Mild basilar atelectasis. No acute bony abnormality.  IMPRESSION: 1. Partial thrombosis of the splenic vein as noted on prior ultrasound. Portal vein and hepatic veins are patent. 2. Mild bladder wall thickening. This may be related to nondistended state. Bladder wall pathology including cystitis cannot be excluded .   Electronically Signed   By: Marcello Moores  Register   On: 09/23/2014 16:35    Microbiology: Recent Results (from the past 240 hour(s))  URINE CULTURE     Status: None   Collection Time    09/25/14  9:00 PM      Result Value Ref Range Status   Specimen Description URINE, CLEAN CATCH   Final   Special Requests NONE   Final   Culture  Setup Time     Final   Value: 09/26/2014 13:36     Performed at [facility]     Final   Value: 10,000 COLONIES/ML     Performed at [facility]   Culture     Final   Value: Multiple bacterial morphotypes present, none predominant. Suggest appropriate recollection if clinically indicated.     Performed at [facility]   Report Status  09/27/2014 FINAL   Final     Labs: Basic Metabolic Panel:  Recent Labs Lab 09/24/14 0538 09/25/14 0534 09/26/14 0533 09/26/14 2234 09/27/14 0515  NA 140 139 137 135* 140  K 2.9* 3.4* 3.3* 4.2 3.6*  CL 99 103 98 99 102  CO2 28 25 26 29 28   GLUCOSE 78 96 143* 98 99  BUN 7 5* 5* 7 9  CREATININE 0.78 0.78 0.81 0.83 0.81  CALCIUM 8.2* 7.7* 8.2* 8.4 8.3*  MG  --  1.4*  --  1.8  --   PHOS  --   --   --  3.8  --    Liver Function Tests:  Recent Labs Lab 09/23/14 1020 09/24/14 0538 09/25/14 0534 09/26/14 0533 09/26/14 2234  AST 925* 510* 451* 558* 616*  ALT 500* 326* 290* 352* 397*  ALKPHOS 397* 284* 218*  242* 236*  BILITOT 10.7* 8.4* 5.8* 5.1* 3.8*  PROT 8.5* 6.6 5.7* 6.6 6.6  ALBUMIN 3.8 2.8* 2.4* 2.6* 2.6*    Recent Labs Lab 09/23/14 1020  LIPASE 19   No results found for this basename: AMMONIA,  in the last 168 hours CBC:  Recent Labs Lab 09/23/14 1020 09/24/14 0538 09/25/14 0534 09/26/14 0534 09/26/14 2234  WBC 7.5 5.9 5.4 6.1 5.8  NEUTROABS 3.6  --   --   --   --   HGB 12.0 9.8* 8.7* 9.9* 9.7*  HCT 35.3* 30.1* 27.0* 30.5* 29.8*  MCV 81.0 83.6 84.9 84.7 84.9  PLT 544* 429* 350 394 362   Cardiac Enzymes: No results found for this basename: CKTOTAL, CKMB, CKMBINDEX, TROPONINI,  in the last 168 hours BNP: BNP (last 3 results) No results found for this basename: PROBNP,  in the last 8760 hours CBG: No results found for this basename: GLUCAP,  in the last 168 hours     Signed:  Radene Gunning  [facility] 09/27/2014, 12:21 PM

## 2014-10-01 LAB — HEPATITIS C GENOTYPE

## 2014-10-29 ENCOUNTER — Encounter (HOSPITAL_COMMUNITY): Payer: Self-pay | Admitting: Gastroenterology

## 2014-10-31 ENCOUNTER — Ambulatory Visit: Payer: Self-pay | Admitting: Gastroenterology

## 2014-10-31 ENCOUNTER — Encounter: Payer: Self-pay | Admitting: Gastroenterology

## 2015-08-02 ENCOUNTER — Emergency Department (HOSPITAL_COMMUNITY)
Admission: EM | Admit: 2015-08-02 | Discharge: 2015-08-02 | Disposition: A | Discharge status: Home / Self Care | Payer: Self-pay | Attending: Emergency Medicine | Admitting: Emergency Medicine

## 2015-08-02 ENCOUNTER — Encounter (HOSPITAL_COMMUNITY): Payer: Self-pay | Admitting: Emergency Medicine

## 2015-08-02 DIAGNOSIS — Z862 Personal history of diseases of the blood and blood-forming organs and certain disorders involving the immune mechanism: Secondary | ICD-10-CM | POA: Insufficient documentation

## 2015-08-02 DIAGNOSIS — Y998 Other external cause status: Secondary | ICD-10-CM | POA: Insufficient documentation

## 2015-08-02 DIAGNOSIS — Y9289 Other specified places as the place of occurrence of the external cause: Secondary | ICD-10-CM | POA: Insufficient documentation

## 2015-08-02 DIAGNOSIS — J45909 Unspecified asthma, uncomplicated: Secondary | ICD-10-CM | POA: Insufficient documentation

## 2015-08-02 DIAGNOSIS — Z8659 Personal history of other mental and behavioral disorders: Secondary | ICD-10-CM | POA: Insufficient documentation

## 2015-08-02 DIAGNOSIS — Y9389 Activity, other specified: Secondary | ICD-10-CM | POA: Insufficient documentation

## 2015-08-02 DIAGNOSIS — Z7901 Long term (current) use of anticoagulants: Secondary | ICD-10-CM | POA: Insufficient documentation

## 2015-08-02 DIAGNOSIS — Z8719 Personal history of other diseases of the digestive system: Secondary | ICD-10-CM | POA: Insufficient documentation

## 2015-08-02 DIAGNOSIS — Z87891 Personal history of nicotine dependence: Secondary | ICD-10-CM | POA: Insufficient documentation

## 2015-08-02 DIAGNOSIS — Z0441 Encounter for examination and observation following alleged adult rape: Secondary | ICD-10-CM | POA: Insufficient documentation

## 2015-08-02 DIAGNOSIS — Z21 Asymptomatic human immunodeficiency virus [HIV] infection status: Secondary | ICD-10-CM | POA: Insufficient documentation

## 2015-08-02 DIAGNOSIS — Z872 Personal history of diseases of the skin and subcutaneous tissue: Secondary | ICD-10-CM | POA: Insufficient documentation

## 2015-08-02 DIAGNOSIS — Z86711 Personal history of pulmonary embolism: Secondary | ICD-10-CM | POA: Insufficient documentation
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
## 2017-01-18 ENCOUNTER — Observation Stay (HOSPITAL_COMMUNITY)
Admission: EM | Admit: 2017-01-18 | Discharge: 2017-01-19 | Disposition: A | Discharge status: Home / Self Care | Payer: No Typology Code available for payment source | Attending: Internal Medicine | Admitting: Internal Medicine

## 2017-01-18 ENCOUNTER — Encounter (HOSPITAL_COMMUNITY): Payer: Self-pay | Admitting: Emergency Medicine

## 2017-01-18 DIAGNOSIS — F10129 Alcohol abuse with intoxication, unspecified: Secondary | ICD-10-CM | POA: Insufficient documentation

## 2017-01-18 DIAGNOSIS — G8929 Other chronic pain: Secondary | ICD-10-CM | POA: Insufficient documentation

## 2017-01-18 DIAGNOSIS — Z87891 Personal history of nicotine dependence: Secondary | ICD-10-CM | POA: Diagnosis not present

## 2017-01-18 DIAGNOSIS — F1210 Cannabis abuse, uncomplicated: Secondary | ICD-10-CM | POA: Insufficient documentation

## 2017-01-18 DIAGNOSIS — F419 Anxiety disorder, unspecified: Secondary | ICD-10-CM | POA: Insufficient documentation

## 2017-01-18 DIAGNOSIS — D6861 Antiphospholipid syndrome: Secondary | ICD-10-CM | POA: Insufficient documentation

## 2017-01-18 DIAGNOSIS — F1310 Sedative, hypnotic or anxiolytic abuse, uncomplicated: Secondary | ICD-10-CM | POA: Insufficient documentation

## 2017-01-18 DIAGNOSIS — F1910 Other psychoactive substance abuse, uncomplicated: Secondary | ICD-10-CM | POA: Diagnosis present

## 2017-01-18 DIAGNOSIS — Z833 Family history of diabetes mellitus: Secondary | ICD-10-CM | POA: Insufficient documentation

## 2017-01-18 DIAGNOSIS — D649 Anemia, unspecified: Secondary | ICD-10-CM | POA: Diagnosis not present

## 2017-01-18 DIAGNOSIS — E876 Hypokalemia: Secondary | ICD-10-CM | POA: Diagnosis not present

## 2017-01-18 DIAGNOSIS — N390 Urinary tract infection, site not specified: Secondary | ICD-10-CM | POA: Diagnosis not present

## 2017-01-18 DIAGNOSIS — M549 Dorsalgia, unspecified: Secondary | ICD-10-CM | POA: Diagnosis not present

## 2017-01-18 DIAGNOSIS — J45909 Unspecified asthma, uncomplicated: Secondary | ICD-10-CM | POA: Insufficient documentation

## 2017-01-18 DIAGNOSIS — F1510 Other stimulant abuse, uncomplicated: Secondary | ICD-10-CM | POA: Insufficient documentation

## 2017-01-18 DIAGNOSIS — M5127 Other intervertebral disc displacement, lumbosacral region: Secondary | ICD-10-CM | POA: Insufficient documentation

## 2017-01-18 DIAGNOSIS — Z86711 Personal history of pulmonary embolism: Secondary | ICD-10-CM | POA: Insufficient documentation

## 2017-01-18 DIAGNOSIS — Z21 Asymptomatic human immunodeficiency virus [HIV] infection status: Secondary | ICD-10-CM | POA: Diagnosis not present

## 2017-01-18 DIAGNOSIS — K219 Gastro-esophageal reflux disease without esophagitis: Secondary | ICD-10-CM | POA: Insufficient documentation

## 2017-01-18 DIAGNOSIS — S0990XA Unspecified injury of head, initial encounter: Secondary | ICD-10-CM | POA: Insufficient documentation

## 2017-01-18 DIAGNOSIS — F319 Bipolar disorder, unspecified: Secondary | ICD-10-CM | POA: Insufficient documentation

## 2017-01-18 DIAGNOSIS — Z888 Allergy status to other drugs, medicaments and biological substances status: Secondary | ICD-10-CM | POA: Diagnosis not present

## 2017-01-18 DIAGNOSIS — D6862 Lupus anticoagulant syndrome: Secondary | ICD-10-CM | POA: Diagnosis not present

## 2017-01-18 DIAGNOSIS — Z8249 Family history of ischemic heart disease and other diseases of the circulatory system: Secondary | ICD-10-CM | POA: Insufficient documentation

## 2017-01-18 DIAGNOSIS — M5126 Other intervertebral disc displacement, lumbar region: Secondary | ICD-10-CM | POA: Insufficient documentation

## 2017-01-18 DIAGNOSIS — M545 Low back pain, unspecified: Secondary | ICD-10-CM

## 2017-01-18 DIAGNOSIS — Z818 Family history of other mental and behavioral disorders: Secondary | ICD-10-CM | POA: Insufficient documentation

## 2017-01-18 DIAGNOSIS — Y92410 Unspecified street and highway as the place of occurrence of the external cause: Secondary | ICD-10-CM | POA: Diagnosis not present

## 2017-01-18 DIAGNOSIS — K029 Dental caries, unspecified: Secondary | ICD-10-CM | POA: Diagnosis not present

## 2017-01-18 DIAGNOSIS — I2699 Other pulmonary embolism without acute cor pulmonale: Secondary | ICD-10-CM | POA: Diagnosis present

## 2017-01-18 DIAGNOSIS — F410 Panic disorder [episodic paroxysmal anxiety] without agoraphobia: Secondary | ICD-10-CM | POA: Insufficient documentation

## 2017-01-18 DIAGNOSIS — Z8489 Family history of other specified conditions: Secondary | ICD-10-CM | POA: Insufficient documentation

## 2017-01-18 DIAGNOSIS — F10929 Alcohol use, unspecified with intoxication, unspecified: Secondary | ICD-10-CM | POA: Diagnosis present

## 2017-01-18 DIAGNOSIS — Z9851 Tubal ligation status: Secondary | ICD-10-CM | POA: Insufficient documentation

## 2017-01-18 MED ORDER — LORAZEPAM 2 MG/ML IJ SOLN
0.5000 mg | Freq: Once | INTRAMUSCULAR | Status: AC
  Administered 2017-01-19: 0.5 mg via INTRAVENOUS
  Filled 2017-01-18: qty 1

## 2017-01-18 MED ORDER — TETANUS-DIPHTH-ACELL PERTUSSIS 5-2.5-18.5 LF-MCG/0.5 IM SUSP
0.5000 mL | Freq: Once | INTRAMUSCULAR | Status: AC
  Administered 2017-01-19: 0.5 mL via INTRAMUSCULAR
  Filled 2017-01-18: qty 0.5

## 2017-01-18 MED ORDER — SODIUM CHLORIDE 0.9 % IV BOLUS (SEPSIS)
500.0000 mL | Freq: Once | INTRAVENOUS | Status: AC
  Administered 2017-01-19: 500 mL via INTRAVENOUS
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
## 2017-01-19 ENCOUNTER — Emergency Department (HOSPITAL_COMMUNITY): Payer: No Typology Code available for payment source

## 2017-01-19 DIAGNOSIS — I2699 Other pulmonary embolism without acute cor pulmonale: Principal | ICD-10-CM

## 2017-01-19 DIAGNOSIS — F10920 Alcohol use, unspecified with intoxication, uncomplicated: Secondary | ICD-10-CM

## 2017-01-19 DIAGNOSIS — D6861 Antiphospholipid syndrome: Secondary | ICD-10-CM

## 2017-01-19 DIAGNOSIS — F1910 Other psychoactive substance abuse, uncomplicated: Secondary | ICD-10-CM

## 2017-01-19 DIAGNOSIS — F10929 Alcohol use, unspecified with intoxication, unspecified: Secondary | ICD-10-CM | POA: Diagnosis present

## 2017-01-19 LAB — COMPREHENSIVE METABOLIC PANEL
ALK PHOS: 81 U/L (ref 38–126)
ALT: 52 U/L (ref 14–54)
AST: 64 U/L — AB (ref 15–41)
Albumin: 3.5 g/dL (ref 3.5–5.0)
Anion gap: 11 (ref 5–15)
CALCIUM: 9 mg/dL (ref 8.9–10.3)
CHLORIDE: 107 mmol/L (ref 101–111)
CO2: 22 mmol/L (ref 22–32)
CREATININE: 0.73 mg/dL (ref 0.44–1.00)
GFR calc non Af Amer: >60 mL/min (ref >60)
GLUCOSE: 81 mg/dL (ref 65–99)
Potassium: 3.6 mmol/L (ref 3.5–5.1)
SODIUM: 140 mmol/L (ref 135–145)
Total Bilirubin: 0.2 mg/dL — ABNORMAL LOW (ref 0.3–1.2)
Total Protein: 8.5 g/dL — ABNORMAL HIGH (ref 6.5–8.1)

## 2017-01-19 LAB — I-STAT CHEM 8, ED
BUN: 5 mg/dL — ABNORMAL LOW (ref 6–20)
CHLORIDE: 108 mmol/L (ref 101–111)
Calcium, Ion: 1.01 mmol/L — ABNORMAL LOW (ref 1.15–1.40)
Creatinine, Ser: 1 mg/dL (ref 0.44–1.00)
Glucose, Bld: 81 mg/dL (ref 65–99)
HEMATOCRIT: 41 % (ref 36.0–46.0)
Hemoglobin: 13.9 g/dL (ref 12.0–15.0)
POTASSIUM: 3.7 mmol/L (ref 3.5–5.1)
Sodium: 145 mmol/L (ref 135–145)
TCO2: 24 mmol/L (ref 0–100)

## 2017-01-19 LAB — CBC
HCT: 37.6 % (ref 36.0–46.0)
Hemoglobin: 12.2 g/dL (ref 12.0–15.0)
MCH: 29.8 pg (ref 26.0–34.0)
MCHC: 32.4 g/dL (ref 30.0–36.0)
MCV: 91.9 fL (ref 78.0–100.0)
PLATELETS: 240 10*3/uL (ref 150–400)
RBC: 4.09 MIL/uL (ref 3.87–5.11)
RDW: 18.4 % — AB (ref 11.5–15.5)
WBC: 4.6 10*3/uL (ref 4.0–10.5)

## 2017-01-19 LAB — CDS SEROLOGY

## 2017-01-19 LAB — URINALYSIS, ROUTINE W REFLEX MICROSCOPIC
BACTERIA UA: NONE SEEN
Bilirubin Urine: NEGATIVE
GLUCOSE, UA: NEGATIVE mg/dL
Ketones, ur: NEGATIVE mg/dL
Leukocytes, UA: NEGATIVE
Nitrite: NEGATIVE
PH: 6 (ref 5.0–8.0)
Protein, ur: NEGATIVE mg/dL
Specific Gravity, Urine: 1.029 (ref 1.005–1.030)

## 2017-01-19 LAB — I-STAT BETA HCG BLOOD, ED (MC, WL, AP ONLY)

## 2017-01-19 LAB — SAMPLE TO BLOOD BANK

## 2017-01-19 LAB — RAPID URINE DRUG SCREEN, HOSP PERFORMED
Amphetamines: POSITIVE — AB
Barbiturates: NOT DETECTED
Benzodiazepines: POSITIVE — AB
Cocaine: NOT DETECTED
OPIATES: NOT DETECTED
Tetrahydrocannabinol: POSITIVE — AB

## 2017-01-19 LAB — I-STAT CG4 LACTIC ACID, ED: Lactic Acid, Venous: 3.63 mmol/L (ref 0.5–1.9)

## 2017-01-19 LAB — PROTIME-INR
INR: 1.03
PROTHROMBIN TIME: 13.6 s (ref 11.4–15.2)

## 2017-01-19 LAB — ETHANOL: ALCOHOL ETHYL (B): 249 mg/dL — AB (ref <5)

## 2017-01-19 MED ORDER — OXYCODONE-ACETAMINOPHEN 5-325 MG PO TABS
1.0000 | ORAL_TABLET | Freq: Once | ORAL | Status: AC
Start: 2017-01-19 — End: 2017-01-19
  Administered 2017-01-19: 1 via ORAL
  Filled 2017-01-19: qty 1

## 2017-01-19 MED ORDER — VITAMIN B-1 100 MG PO TABS: 100.0000 mg | ORAL_TABLET | Freq: Every day | ORAL | Status: DC

## 2017-01-19 MED ORDER — RIVAROXABAN (XARELTO) EDUCATION KIT FOR DVT/PE PATIENTS
PACK | Freq: Once | Status: AC
  Administered 2017-01-19: 10:00:00
  Filled 2017-01-19: qty 1

## 2017-01-19 MED ORDER — RIVAROXABAN (XARELTO) VTE STARTER PACK (15 & 20 MG): ORAL_TABLET | ORAL | 2 refills | Status: DC

## 2017-01-19 MED ORDER — LORAZEPAM 1 MG PO TABS: 1.0000 mg | ORAL_TABLET | Freq: Four times a day (QID) | ORAL | Status: DC | PRN

## 2017-01-19 MED ORDER — OXYCODONE-ACETAMINOPHEN 5-325 MG PO TABS
2.0000 | ORAL_TABLET | Freq: Once | ORAL | Status: AC
  Administered 2017-01-19: 2 via ORAL
  Filled 2017-01-19: qty 2

## 2017-01-19 MED ORDER — FOLIC ACID 1 MG PO TABS: 1.0000 mg | ORAL_TABLET | Freq: Every day | ORAL | Status: DC

## 2017-01-19 MED ORDER — LORAZEPAM 2 MG/ML IJ SOLN: 1.0000 mg | Freq: Four times a day (QID) | INTRAMUSCULAR | Status: DC | PRN

## 2017-01-19 MED ORDER — ADULT MULTIVITAMIN W/MINERALS CH: 1.0000 | ORAL_TABLET | Freq: Every day | ORAL | Status: DC

## 2017-01-19 MED ORDER — SODIUM CHLORIDE 0.9 % IV BOLUS (SEPSIS)
1000.0000 mL | Freq: Once | INTRAVENOUS | Status: AC
  Administered 2017-01-19: 1000 mL via INTRAVENOUS

## 2017-01-19 MED ORDER — RIVAROXABAN (XARELTO) EDUCATION KIT FOR DVT/PE PATIENTS: 1.0000 | PACK | Freq: Once | 0 refills | Status: AC

## 2017-01-19 MED ORDER — IOPAMIDOL (ISOVUE-300) INJECTION 61%
INTRAVENOUS | Status: AC
  Administered 2017-01-19: 100 mL via INTRAVENOUS
  Filled 2017-01-19: qty 100

## 2017-01-19 MED ORDER — THIAMINE HCL 100 MG/ML IJ SOLN: 100.0000 mg | Freq: Every day | INTRAMUSCULAR | Status: DC

## 2017-01-19 MED ORDER — RIVAROXABAN 15 MG PO TABS
15.0000 mg | ORAL_TABLET | Freq: Two times a day (BID) | ORAL | Status: DC
  Administered 2017-01-19: 15 mg via ORAL
  Filled 2017-01-19: qty 1

## 2017-01-19 NOTE — Discharge Planning (Signed)
[name] J. Anna Laming, RN, BSN, [place] [phone] [facility] set up appointment with Anna Sickle, NP at [facility] on 2/20 @1000 .  Spoke with pt at bedside and advised to please arrive 15 min early and take a picture ID and your current medications.  Pt verbalizes understanding of keeping appointment.  Pt given brochure with 30 day free card and refill assistance card intact.  Pt utilizes [facility] in [place] for prescription needs.  NCM called pharmacy to confirm availability of medication.  Information relayed to pt.  Pt verbalizes importance of filling medication upon discharge.   EDCM also provided pt eligibility information for the [facility].  Pt verbalizes understanding that she must apply to become a pt there.

## 2017-01-19 NOTE — Discharge Summary (Signed)
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 or any previous visit (from the past 240 hour(s)).   Labs: BNP (last 3  results) No results for input(s): BNP in the last 8760 hours. Basic Metabolic Panel:  Recent Labs Lab 01/19/17 0007 01/19/17 0012  NA 140 145  K 3.6 3.7  CL 107 108  CO2 22  --   GLUCOSE 81 81  BUN <5* 5*  CREATININE 0.73 1.00  CALCIUM 9.0  --    Liver Function Tests:  Recent Labs Lab 01/19/17 0007  AST 64*  ALT 52  ALKPHOS 81  BILITOT 0.2*  PROT 8.5*  ALBUMIN 3.5   No results for input(s): LIPASE, AMYLASE in the last 168 hours. No results for input(s): AMMONIA in the last 168 hours. CBC:  Recent Labs Lab 01/19/17 0007 01/19/17 0012  WBC 4.6  --   HGB 12.2 13.9  HCT 37.6 41.0  MCV 91.9  --   PLT 240  --    Cardiac Enzymes: No results for input(s): CKTOTAL, CKMB, CKMBINDEX, TROPONINI in the last 168 hours. BNP: Invalid input(s): POCBNP CBG: No results for input(s): GLUCAP in the last 168 hours. D-Dimer No results for input(s): DDIMER in the last 72 hours. Hgb A1c No results for input(s): HGBA1C in the last 72 hours. Lipid  Profile No results for input(s): CHOL, HDL, LDLCALC, TRIG, CHOLHDL, LDLDIRECT in the last 72 hours. Thyroid function studies No results for input(s): TSH, T4TOTAL, T3FREE, THYROIDAB in the last 72 hours.  Invalid input(s): FREET3 Anemia work up No results for input(s): VITAMINB12, FOLATE, FERRITIN, TIBC, IRON, RETICCTPCT in the last 72 hours. Urinalysis    Component Value Date/Time   COLORURINE STRAW (A) 01/18/2017 0425   APPEARANCEUR CLEAR 01/18/2017 0425   LABSPEC 1.029 01/18/2017 0425   PHURINE 6.0 01/18/2017 0425   GLUCOSEU NEGATIVE 01/18/2017 0425   HGBUR SMALL (A) 01/18/2017 0425   BILIRUBINUR NEGATIVE 01/18/2017 0425   KETONESUR NEGATIVE 01/18/2017 0425   PROTEINUR NEGATIVE 01/18/2017 0425   UROBILINOGEN 2.0 (H) 09/23/2014 1030   NITRITE NEGATIVE 01/18/2017 0425   LEUKOCYTESUR NEGATIVE 01/18/2017 0425   Sepsis Labs Invalid input(s): PROCALCITONIN,  WBC,  LACTICIDVEN Microbiology No results found for this or any previous visit (from the past 240 hour(s)).   Time coordinating discharge: < 30 minutes  SIGNED:   Louellen Molder, MD  [facility] 01/19/2017, 11:11 AM Pager   If 7PM-7AM, please contact night-coverage [email or web address] Password [email or web address]

## 2017-01-19 NOTE — H&P (Signed)
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 normal gray-white differentiation. No mass effect or midline shift is seen. Vascular: No hyperdense vessel or unexpected calcification. Skull: There is no evidence of fracture; visualized osseous structures are unremarkable in appearance. Other: No significant soft tissue abnormalities are seen. CT MAXILLOFACIAL FINDINGS Osseous: There is no evidence of fracture or dislocation. The maxilla and mandible appear intact. The nasal bone is unremarkable in appearance. Multiple large maxillary and mandibular dental caries are noted. Orbits: The orbits are intact bilaterally. Sinuses: The visualized paranasal sinuses and mastoid air cells are well-aerated. Soft tissues: No significant soft tissue abnormalities are seen. The parapharyngeal fat planes are preserved. The nasopharynx, oropharynx and hypopharynx are unremarkable in appearance. The visualized portions of the valleculae and piriform sinuses are grossly unremarkable. The parotid and submandibular glands are within normal limits. No cervical lymphadenopathy is seen. CT CERVICAL SPINE FINDINGS Alignment: Normal.  Skull base and vertebrae: No acute fracture. No primary bone lesion or focal pathologic process. Soft tissues and spinal canal: No prevertebral fluid or swelling. No visible canal hematoma. Disc levels: Intervertebral disc spaces are preserved. The bony foramina are grossly unremarkable in appearance. Upper chest: The thyroid gland is unremarkable in appearance. Other: No additional soft tissue abnormalities are seen. IMPRESSION: 1. No evidence of traumatic intracranial injury or fracture. 2. No evidence of fracture or dislocation with regard to the maxillofacial structures. 3. No evidence of fracture or subluxation along the cervical spine. 4. Multiple large maxillary and mandibular dental caries noted. Electronically Signed   By: Garald Balding M.D.   On: 01/19/2017 01:53    EKG: Independently reviewed.  Assessment/Plan Principal Problem:   Recurrent pulmonary embolism (HCC) Active Problems:   Antiphospholipid antibody syndrome (HCC)   Substance abuse   Alcohol intoxication ([place])    1. Recurrent PE with antiphospholipid AB syndrome - 1. Xarelto per pharm consult 2. Likely needs lifelong anticoagulation 2. EtOH intoxication - 1. Uncomplicated 2. Alcohol withdrawal prevention protocol   DVT prophylaxis: Xeralto Code Status: Full Family Communication: No family in room Consults called: None Admission status: Place in [place], [place] Hospitalists Pager [phone] from 7PM-7AM  If 7AM-7PM, please contact the day physician for the patient [email or web address] Password [email or web address]  01/19/2017, 3:59 AM

## 2017-01-19 NOTE — Discharge Instructions (Signed)
Information on my medicine - XARELTO (rivaroxaban)  This medication education was reviewed with me or my healthcare representative as part of my discharge preparation.  The pharmacist that spoke with me during my hospital stay was:  Eudelia Bunch, [place]? Xarelto was prescribed to treat blood clots that may have been found in the veins of your legs (deep vein thrombosis) or in your lungs (pulmonary embolism) and to reduce the risk of them occurring again.  What do you need to know about Xarelto? The starting dose is one 15 mg tablet taken TWICE daily with food for the FIRST 21 DAYS then on February 13th or 14th (when finished with the 15 mg tablets in your starter pack)  the dose is changed to one 20 mg tablet taken ONCE A DAY with your evening meal.  DO NOT stop taking Xarelto without talking to the health care provider who prescribed the medication.  Refill your prescription for 20 mg tablets before you run out.  After discharge, you should have regular check-up appointments with your healthcare provider that is prescribing your Xarelto.  In the future your dose may need to be changed if your kidney function changes by a significant amount.  What do you do if you miss a dose? If you are taking Xarelto TWICE DAILY and you miss a dose, take it as soon as you remember. You may take two 15 mg tablets (total 30 mg) at the same time then resume your regularly scheduled 15 mg twice daily the next day.  If you are taking Xarelto ONCE DAILY and you miss a dose, take it as soon as you remember on the same day then continue your regularly scheduled once daily regimen the next day. Do not take two doses of Xarelto at the same time.   Important Safety Information Xarelto is a blood thinner medicine that can cause bleeding. You should call your healthcare provider right away if you experience any of the following: ? Bleeding from an injury or your nose that does  not stop. ? Unusual colored urine (red or dark brown) or unusual colored stools (red or black). ? Unusual bruising for unknown reasons. ? A serious fall or if you hit your head (even if there is no bleeding).  Some medicines may interact with Xarelto and might increase your risk of bleeding while on Xarelto. To help avoid this, consult your healthcare provider or pharmacist prior to using any new prescription or non-prescription medications, including herbals, vitamins, non-steroidal anti-inflammatory drugs (NSAIDs) and supplements.  This website has more information on Xarelto: [email or web address]

## 2017-01-19 NOTE — ED Notes (Signed)
Ambulated up to bathroom without difficulty.
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
## 2017-01-19 NOTE — ED Notes (Signed)
Pt did not need anything at this time  

## 2017-01-19 NOTE — ED Notes (Signed)
Pt gone for x-ray/ct

## 2017-01-21 ENCOUNTER — Telehealth: Payer: Self-pay | Admitting: *Deleted

## 2017-01-21 NOTE — Telephone Encounter (Signed)
Chart reviewed.  Spoke with pt concerning his medications and not being able to afford them.  Explained the [place]- Medication Assistance program to pt. Pt understands that the [facility] program is only a one time in one year from the date of discharge to next year. Pt also understands that there is a $3.00 co pay for each prescription.

## 2017-02-16 ENCOUNTER — Ambulatory Visit: Payer: Self-pay | Admitting: Family Medicine

## 2017-06-15 ENCOUNTER — Emergency Department (HOSPITAL_COMMUNITY)
Admission: EM | Admit: 2017-06-15 | Discharge: 2017-06-15 | Disposition: A | Discharge status: Home / Self Care | Payer: Self-pay | Attending: Emergency Medicine | Admitting: Emergency Medicine

## 2017-06-15 ENCOUNTER — Emergency Department (HOSPITAL_COMMUNITY): Payer: Self-pay

## 2017-06-15 ENCOUNTER — Encounter (HOSPITAL_COMMUNITY): Payer: Self-pay | Admitting: Emergency Medicine

## 2017-06-15 DIAGNOSIS — Z7901 Long term (current) use of anticoagulants: Secondary | ICD-10-CM | POA: Insufficient documentation

## 2017-06-15 DIAGNOSIS — J45909 Unspecified asthma, uncomplicated: Secondary | ICD-10-CM

## 2017-06-15 DIAGNOSIS — Z87891 Personal history of nicotine dependence: Secondary | ICD-10-CM | POA: Insufficient documentation

## 2017-06-15 DIAGNOSIS — J189 Pneumonia, unspecified organism: Secondary | ICD-10-CM | POA: Insufficient documentation

## 2017-06-15 DIAGNOSIS — Z86711 Personal history of pulmonary embolism: Secondary | ICD-10-CM

## 2017-06-15 DIAGNOSIS — J181 Lobar pneumonia, unspecified organism: Secondary | ICD-10-CM

## 2017-06-15 LAB — BASIC METABOLIC PANEL
ANION GAP: 10 (ref 5–15)
BUN: 9 mg/dL (ref 6–20)
CHLORIDE: 102 mmol/L (ref 101–111)
CO2: 25 mmol/L (ref 22–32)
Calcium: 8.1 mg/dL — ABNORMAL LOW (ref 8.9–10.3)
Creatinine, Ser: 0.73 mg/dL (ref 0.44–1.00)
GFR calc non Af Amer: >60 mL/min (ref >60)
Glucose, Bld: 93 mg/dL (ref 65–99)
Potassium: 3.6 mmol/L (ref 3.5–5.1)
Sodium: 137 mmol/L (ref 135–145)

## 2017-06-15 LAB — PROTIME-INR
INR: 1.01
Prothrombin Time: 13.3 seconds (ref 11.4–15.2)

## 2017-06-15 LAB — CBC WITH DIFFERENTIAL/PLATELET
Basophils Absolute: 0 10*3/uL (ref 0.0–0.1)
Basophils Relative: 0 %
EOS PCT: 1 %
Eosinophils Absolute: 0.2 10*3/uL (ref 0.0–0.7)
HCT: 35.3 % — ABNORMAL LOW (ref 36.0–46.0)
Hemoglobin: 11.7 g/dL — ABNORMAL LOW (ref 12.0–15.0)
LYMPHS ABS: 1.6 10*3/uL (ref 0.7–4.0)
LYMPHS PCT: 14 %
MCH: 31.3 pg (ref 26.0–34.0)
MCHC: 33.1 g/dL (ref 30.0–36.0)
MCV: 94.4 fL (ref 78.0–100.0)
MONOS PCT: 7 %
Monocytes Absolute: 0.8 10*3/uL (ref 0.1–1.0)
Neutro Abs: 8.9 10*3/uL — ABNORMAL HIGH (ref 1.7–7.7)
Neutrophils Relative %: 78 %
Platelets: 323 10*3/uL (ref 150–400)
RBC: 3.74 MIL/uL — AB (ref 3.87–5.11)
RDW: 17.2 % — ABNORMAL HIGH (ref 11.5–15.5)
WBC: 11.4 10*3/uL — AB (ref 4.0–10.5)

## 2017-06-15 LAB — TROPONIN I: Troponin I: <0.03 ng/mL (ref <0.03)

## 2017-06-15 MED ORDER — ONDANSETRON HCL 4 MG/2ML IJ SOLN
4.0000 mg | Freq: Once | INTRAMUSCULAR | Status: AC
  Administered 2017-06-15: 4 mg via INTRAVENOUS
  Filled 2017-06-15: qty 2

## 2017-06-15 MED ORDER — WARFARIN - PHYSICIAN DOSING INPATIENT: Freq: Every day | Status: DC

## 2017-06-15 MED ORDER — WARFARIN SODIUM 5 MG PO TABS
5.0000 mg | ORAL_TABLET | Freq: Every day | ORAL | 0 refills | Status: DC
Start: 2017-06-15 — End: 2019-11-20

## 2017-06-15 MED ORDER — ALBUTEROL SULFATE HFA 108 (90 BASE) MCG/ACT IN AERS
2.0000 | INHALATION_SPRAY | Freq: Once | RESPIRATORY_TRACT | Status: AC
  Administered 2017-06-15: 2 via RESPIRATORY_TRACT
  Filled 2017-06-15: qty 6.7

## 2017-06-15 MED ORDER — IPRATROPIUM BROMIDE 0.02 % IN SOLN
0.5000 mg | Freq: Once | RESPIRATORY_TRACT | Status: AC
  Administered 2017-06-15: 0.5 mg via RESPIRATORY_TRACT
  Filled 2017-06-15: qty 2.5

## 2017-06-15 MED ORDER — ALBUTEROL SULFATE (2.5 MG/3ML) 0.083% IN NEBU
2.5000 mg | INHALATION_SOLUTION | Freq: Once | RESPIRATORY_TRACT | Status: AC
  Administered 2017-06-15: 2.5 mg via RESPIRATORY_TRACT
  Filled 2017-06-15: qty 3

## 2017-06-15 MED ORDER — SODIUM CHLORIDE 0.9 % IV BOLUS (SEPSIS)
1000.0000 mL | Freq: Once | INTRAVENOUS | Status: AC
  Administered 2017-06-15: 1000 mL via INTRAVENOUS

## 2017-06-15 MED ORDER — OXYMETAZOLINE HCL 0.05 % NA SOLN
2.0000 | Freq: Once | NASAL | Status: AC
  Administered 2017-06-15: 2 via NASAL
  Filled 2017-06-15: qty 15

## 2017-06-15 MED ORDER — DEXTROSE 5 % IV SOLN
1.0000 g | Freq: Once | INTRAVENOUS | Status: AC
  Administered 2017-06-15: 1 g via INTRAVENOUS
  Filled 2017-06-15: qty 10

## 2017-06-15 MED ORDER — ENOXAPARIN SODIUM 30 MG/0.3ML ~~LOC~~ SOLN: 145.0000 mg | SUBCUTANEOUS | 0 refills | Status: DC

## 2017-06-15 MED ORDER — IOPAMIDOL (ISOVUE-370) INJECTION 76%
75.0000 mL | Freq: Once | INTRAVENOUS | Status: AC | PRN
  Administered 2017-06-15: 75 mL via INTRAVENOUS

## 2017-06-15 MED ORDER — AZITHROMYCIN 250 MG PO TABS
500.0000 mg | ORAL_TABLET | Freq: Once | ORAL | Status: AC
  Administered 2017-06-15: 500 mg via ORAL
  Filled 2017-06-15: qty 2

## 2017-06-15 MED ORDER — HYDROCODONE-HOMATROPINE 5-1.5 MG/5ML PO SYRP: 5.0000 mL | ORAL_SOLUTION | Freq: Four times a day (QID) | ORAL | 0 refills | Status: AC | PRN

## 2017-06-15 MED ORDER — AMOXICILLIN-POT CLAVULANATE 875-125 MG PO TABS: 1.0000 | ORAL_TABLET | Freq: Two times a day (BID) | ORAL | 0 refills | Status: DC

## 2017-06-15 MED ORDER — MORPHINE SULFATE (PF) 4 MG/ML IV SOLN
4.0000 mg | Freq: Once | INTRAVENOUS | Status: AC
  Administered 2017-06-15: 4 mg via INTRAVENOUS
  Filled 2017-06-15: qty 1

## 2017-06-15 MED ORDER — ENOXAPARIN SODIUM 150 MG/ML ~~LOC~~ SOLN
1.5000 mg/kg | SUBCUTANEOUS | Status: DC
  Administered 2017-06-15: 145 mg via SUBCUTANEOUS
  Filled 2017-06-15: qty 1

## 2017-06-15 MED ORDER — WARFARIN SODIUM 5 MG PO TABS
5.0000 mg | ORAL_TABLET | ORAL | Status: AC
  Administered 2017-06-15: 5 mg via ORAL
  Filled 2017-06-15: qty 1

## 2017-06-15 MED ORDER — HYDROCODONE-ACETAMINOPHEN 5-325 MG PO TABS
2.0000 | ORAL_TABLET | Freq: Once | ORAL | Status: AC
  Administered 2017-06-15: 2 via ORAL
  Filled 2017-06-15: qty 2

## 2017-06-15 MED ORDER — HYDROCOD POLST-CPM POLST ER 10-8 MG/5ML PO SUER
5.0000 mL | Freq: Once | ORAL | Status: AC
  Administered 2017-06-15: 5 mL via ORAL
  Filled 2017-06-15: qty 5
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
## 2017-06-15 NOTE — Care Management Note (Signed)
Case Management Note  Patient Details  Name: [name] MRN: [id number] Date of Birth: 1991/07/03  Subjective/Objective:                  Pt seen in ED for medication and follow up needs. Pt with CAP and recurrent PE. She was previously on NOAC but was unable to afford and so she stopped taking it. She has previously been on coumadin. Pt lives with fiance, pt unemployed, no children living in the home. Pt will need medication assistance for lovenox, coumadin and augmentin. Pt has done lovenox bridge in the past and is comfortable giving herself injections. Pt will need long term f/u established for CAP and coumadin maintenance for PE.  Action/Plan:  [facility] voucher given for med assistance, coumadin will be on $4 list after first 30-days. Pt will initially f/u with specialty clinic for INR's, this is only temporary and because they have seen her in the past. CM contacted the [facility] to try and establish care, CM waiting to hear back as they had to check with office manager. FC asked to see pt to begin process for financial assistance to help with process of getting pt established at the [facility], [facility] would not see pt because she is not admitted to hospital. CM made f/u appointment with thr [facility] as pt is ready for DC and needs f/u appointment. If [facility] returns my call and wants to establish care with pt, CM will contact pt after DC to make them aware.   Expected Discharge Date:    06/15/2017              Expected Discharge Plan:  Home/Self Care  In-House Referral:  Financial Counselor  Discharge planning Services  CM Consult, Follow-up appt scheduled, [facility], [facility]  Status of Service:  Completed, signed off   Sherald Barge, RN 06/15/2017, 2:40 PM

## 2017-06-15 NOTE — ED Notes (Signed)
ED Provider at bedside. 

## 2017-06-15 NOTE — Consult Note (Signed)
Medical Consultation   [name]  QQP:[id number]  DOB: [birth year]  DOA: 06/15/2017  PCP: Patient, No Pcp Per   Requesting physician: Dr. Alvino Chapel, EDP [place], [place]  Reason for consultation: Pneumonia   History of Present Illness: [name] is an 26 y.o. female with previous history of recurrent PE in the setting of antiphospholipid antibody syndrome. She was last in the hospital in January 2018 where she was prescribed xarelto for pulmonary embolus and discharged home. She reports she was unable to afford this around to and has not been on any anticoagulation since that time. She reports that for the past week she has had worsening shortness of breath, cough productive of blood-tinged sputum, pain on her left chest, which is worse with cough. She's not had any fevers. She is short of breath, likely due to pain. She's had some posttussive vomiting this morning. No diarrhea. No dysuria. She was seen in the emergency room today where she was not requiring any supplemental oxygen. Oxygen saturation 100% on room air. She was noted to be wheezing, received a nebulizer treatment with improvement of her breath sounds. WBC count was noted to be mildly elevated at 11. She was afebrile. CT scan of the chest was negative for acute pulmonary embolus, but did indicate a right lower lobe pneumonia. There was also concern for possible underlying pulmonary infarct.      Review of Systems:  ROS As per HPI otherwise 10 point review of systems negative.     Past Medical History: Past Medical History:  Diagnosis Date  . Abnormal Pap smear   . Anemia   . Anticoagulant long-term use   . Antiphospholipid antibody with hypercoagulable state ([place])   . Anxiety   . Anxiety and depression    suicide attempt-age 41  . Asthma    ABG in 11/2010:7.45, 34, 79., ventolin rescue inhaler last use 03/21/12  . Attention deficit hyperactivity disorder   . Bipolar disorder ([place])    not on meds  . Cough productive of purulent sputum   . Depression    suicide attempt at age 7  . Dermatitis due to detergents   . Edema in pregnancy   . GERD (gastroesophageal reflux disease)   . HIV positive ([place])    Western Blot negative  . Lupus anticoagulant disorder [facility])    Previously followed by Dr. Lieutenant Diego of discharged from his care due to medical noncompliance.  . Panic attacks    not on meds  . Pulmonary embolism ([place]) 06/2010   Multiple bilateral pulmonary emboli diagnosed by CT scan in in 06/2010.  Moderate size pleural effusion and right lower lobe infarction present at that time.  Lupus anticoagulant was present, but all other aspects of her hypercoagulable evaluation were negative including all Cardiolite and antibodies.    . Recurrent upper respiratory infection (URI)   . Shortness of breath    has asthma- scheduled for PFTs on 04/06/12  . Substance abuse    cocaine, opiates, marijuana; alcohol; most recent positive test for cocaine was in December of 2011; alcohol levels have been as high as 302  . Tobacco abuse     Past Surgical History: Past Surgical History:  Procedure Laterality Date  . CESAREAN SECTION  2011, 2009  . CESAREAN SECTION  04/08/2012   Procedure: CESAREAN SECTION;  Surgeon: Donnamae Jude, MD;  Location: [facility];  Service: Gynecology;  Laterality:  N/A;  . HEMANGIOMA EXCISION  2013   R wrist  . TUBAL LIGATION  04/08/2012   Procedure: BILATERAL TUBAL LIGATION;  Surgeon: Donnamae Jude, MD;  Location: [facility];  Service: Gynecology;  Laterality: Bilateral;  . WRIST SURGERY  2009   hemoangioma removed from right wrist     Allergies:   Allergies  Allergen Reactions  . Tramadol     hallucinations     Social History:  reports that she quit smoking about 2 years ago. Her smoking use included Cigarettes. She smoked 0.50 packs per day. She has never used smokeless tobacco. She reports that she drinks alcohol. She reports that she uses drugs,  including Marijuana and Other-see comments.   Family History: Family History  Problem Relation Age of Onset  . Drug abuse Mother        History   . Depression Mother   . Drug abuse Father   . Heart attack Father 56       H/o drug abuse  . Depression Sister   . Depression Maternal Grandmother   . Diabetes Maternal Grandfather   . Anesthesia problems Neg Hx   . Hypotension Neg Hx   . Malignant hyperthermia Neg Hx   . Pseudochol deficiency Neg Hx   . Colon cancer Neg Hx     Physical Exam: Vitals:   06/15/17 1007 06/15/17 1100 06/15/17 1300 06/15/17 1337  BP: 114/75 107/76 104/78 104/66  Pulse: (!) 108 85 86 89  Resp: 16 18 14 18   Temp:    97.8 F (36.6 C)  TempSrc:    Oral  SpO2: 100% 100% 99% 100%  Weight:      Height:        Constitutional: Well-appearing,  Alert and awake, oriented x3, not in any acute distress. Eyes: PERLA, EOMI, irises appear normal, anicteric sclera,  ENMT: external ears and nose appear normal, normal hearing             Lips appears normal, oropharynx mucosa, tongue, posterior pharynx appear normal  Neck: neck appears normal, no masses, normal ROM, no thyromegaly, no JVD  CVS: S1-S2 clear, no murmur rubs or gallops, no LE edema, normal pedal pulses  Respiratory:  clear to auscultation bilaterally, no wheezing, rales or rhonchi. Respiratory effort normal. No accessory muscle use. Some tenderness with palpation on left chest. Abdomen: soft nontender, nondistended, normal bowel sounds, no hepatosplenomegaly, no hernias  Musculoskeletal: : no cyanosis, clubbing or edema noted bilaterally. Contractures, joint deformities.                        Neuro: Cranial nerves II-XII intact, strength, sensation, reflexes Psych: judgement and insight appear normal, stable mood and affect, mental status Skin: no rashes or lesions or ulcers, no induration or nodules   Data reviewed:  I have personally reviewed following labs and imaging studies Labs:   CBC:  Recent Labs Lab 06/15/17 0858  WBC 11.4*  NEUTROABS 8.9*  HGB 11.7*  HCT 35.3*  MCV 94.4  PLT 751    Basic Metabolic Panel:  Recent Labs Lab 06/15/17 0858  NA 137  K 3.6  CL 102  CO2 25  GLUCOSE 93  BUN 9  CREATININE 0.73  CALCIUM 8.1*   GFR Estimated Creatinine Clearance: 125.2 mL/min (by C-G formula based on SCr of 0.73 mg/dL). Liver Function Tests: No results for input(s): AST, ALT, ALKPHOS, BILITOT, PROT, ALBUMIN in the last 168 hours. No results for input(s): LIPASE, AMYLASE  in the last 168 hours. No results for input(s): AMMONIA in the last 168 hours. Coagulation profile  Recent Labs Lab 06/15/17 0858  INR 1.01    Cardiac Enzymes:  Recent Labs Lab 06/15/17 0900  TROPONINI <0.03   BNP: Invalid input(s): POCBNP CBG: No results for input(s): GLUCAP in the last 168 hours. D-Dimer No results for input(s): DDIMER in the last 72 hours. Hgb A1c No results for input(s): HGBA1C in the last 72 hours. Lipid Profile No results for input(s): CHOL, HDL, LDLCALC, TRIG, CHOLHDL, LDLDIRECT in the last 72 hours. Thyroid function studies No results for input(s): TSH, T4TOTAL, T3FREE, THYROIDAB in the last 72 hours.  Invalid input(s): FREET3 Anemia work up No results for input(s): VITAMINB12, FOLATE, FERRITIN, TIBC, IRON, RETICCTPCT in the last 72 hours. Urinalysis    Component Value Date/Time   COLORURINE STRAW (A) 01/18/2017 0425   APPEARANCEUR CLEAR 01/18/2017 0425   LABSPEC 1.029 01/18/2017 0425   PHURINE 6.0 01/18/2017 0425   GLUCOSEU NEGATIVE 01/18/2017 0425   HGBUR SMALL (A) 01/18/2017 0425   BILIRUBINUR NEGATIVE 01/18/2017 0425   KETONESUR NEGATIVE 01/18/2017 0425   PROTEINUR NEGATIVE 01/18/2017 0425   UROBILINOGEN 2.0 (H) 09/23/2014 1030   NITRITE NEGATIVE 01/18/2017 0425   LEUKOCYTESUR NEGATIVE 01/18/2017 0425     Microbiology No results found for this or any previous visit (from the past 240 hour(s)).     Inpatient  Medications:   Scheduled Meds: . enoxaparin (LOVENOX) injection  1.5 mg/kg Subcutaneous Q24H  . Warfarin - Physician Dosing Inpatient   Does not apply q1800   Continuous Infusions:   Radiological Exams on Admission: Dg Chest 2 View  Result Date: 06/15/2017 CLINICAL DATA:  Productive cough EXAM: CHEST  2 VIEW COMPARISON:  01/19/2017 FINDINGS: The heart size and mediastinal contours are within normal limits. Both lungs are clear. The visualized skeletal structures are unremarkable. IMPRESSION: No active cardiopulmonary disease. Electronically Signed   By: Inez Catalina M.D.   On: 06/15/2017 09:29   Ct Angio Chest Pe W Or Wo Contrast  Result Date: 06/15/2017 CLINICAL DATA:  Intermittent cough for several years, history of prior pulmonary embolism EXAM: CT ANGIOGRAPHY CHEST WITH CONTRAST TECHNIQUE: Multidetector CT imaging of the chest was performed using the standard protocol during bolus administration of intravenous contrast. Multiplanar CT image reconstructions and MIPs were obtained to evaluate the vascular anatomy. CONTRAST:  75 mL Isovue 370. COMPARISON:  06/15/2017, 01/19/2017 FINDINGS: Cardiovascular: Thoracic aorta demonstrates a normal branching pattern. No aneurysmal dilatation dissection is seen. No significant atherosclerotic calcifications are noted. No cardiac enlargement is seen. The opacification of the pulmonary artery is slightly limited although no definitive pulmonary embolism is seen. Mediastinum/Nodes: The thoracic inlet is unremarkable. No significant spinal adenopathy is seen. Some small right hilar lymph nodes are noted. The largest of these measures 1 cm in short axis. Lungs/Pleura: The lungs are well aerated bilaterally. No sizable effusion is seen. There is a mixed solid and ground-glass area of opacification within the right lower lobe. It measures approximately 4 cm in greatest dimension. The solid component measures approximately 2.7 cm in greatest dimension. Air  bronchograms are noted within and given the abrupt onset when compared with the prior exam this likely represents a focal round pneumonia. Partial infarction would also deserve consideration given the patient's known history of prior pulmonary embolism. Upper Abdomen: Diffuse decreased attenuation is noted within the liver consistent with fatty infiltration. The remainder the upper abdomen is within normal limits. Musculoskeletal: No acute abnormality noted. Review  of the MIP images confirms the above findings. IMPRESSION: Rounded area of both dense solid and ground-glass attenuation within the superior segment of the right lower lobe. It is new from the prior exam from January of 2018 and given the clinical history likely represents a focal pneumonic infiltrate. A portion of this may also be related to pulmonary infarct given the history of pulmonary embolism in this region. It was not well appreciated on recent chest x-ray due to its more posterior location. Follow-up CT following appropriate therapy is recommended in 6-8 weeks. Chronic changes as described above. Electronically Signed   By: Inez Catalina M.D.   On: 06/15/2017 10:11    Impression/Recommendations  1. Community-acquired pneumonia. Patient found to have right lower lobe infiltrate which appears to be a pneumonia. CT chest was reviewed with radiology who could not definitively rule out abscess, but felt appearance of lung findings were more consistent with pneumonia. Clinically she's not had any high fevers, significant leukocytosis. She does not have any oxygen requirement. At this point, it seems to be reasonable to treat her with a course of oral antibiotics. Would recommend a course of Augmentin. She's been advised that if she does develop any high fevers, worsening shortness of breath or cough, she should return to the emergency room for evaluation. She is advised that she will need a repeat CT scan of the chest in 6-8 weeks to evaluate for  resolution. 2. History of recurrent venous thromboembolism in the setting of antiphospholipid antibody. Patient reports that she cannot afford novel oral anticoagulants due to lack of insurance. I discussed with case management and we will arrange for patient to receive Lovenox injection and Coumadin. She has taken Lovenox injections before and is comfortable administering them. We have also set her up with the hematology clinic to monitor her INR. She has an appointment for 6/20 to follow-up. She has also been seen by case management to help set her up with her primary care physician. 3. Asthma. She was initially wheezing on presentation, but this has resolved with nebulizer treatments. She does have an albuterol metered-dose inhaler to use at home.   At this point, patient appears to be stable for discharge home. She was given information on danger signs and when to return to the hospital.       Time Spent: 108mins  Christ Fullenwider M.D. [facility] 06/15/2017, 1:38 PM

## 2017-06-15 NOTE — Discharge Instructions (Signed)
Your xray suggest right lung pneumonia. Please use augmentin 2 times daily with food. Use coumadin and lovenox daily for the Pulmonary Embolism history.  Increase fluids. Use hycodan for cough and congestion. 2 puffs of albuterol every 4 hours daily. Arrangements are being made for you to be seen in the clinic for management of your coumadin.

## 2017-06-15 NOTE — ED Triage Notes (Addendum)
Pt reports intermittent productive cough ever since son was born x7 years ago. Pt reports latest episode started x1 month ago. Pt reports was told had x2 pulmonary embolism in January 2018. Pt reports has been unable to fill xarelto prescription. Moderate dyspnea noted with exertion. Pt reports chest and right sided back pain with inspiration and movement.

## 2017-06-15 NOTE — ED Notes (Signed)
RT has been called

## 2017-06-16 ENCOUNTER — Encounter (HOSPITAL_COMMUNITY): Payer: Self-pay | Attending: Oncology | Admitting: Oncology

## 2017-06-16 ENCOUNTER — Encounter (HOSPITAL_COMMUNITY): Payer: Self-pay

## 2017-06-16 VITALS — BP 104/68 | HR 103 | Temp 97.9°F | Resp 18 | Ht 67.0 in | Wt 209.0 lb

## 2017-06-16 DIAGNOSIS — I2699 Other pulmonary embolism without acute cor pulmonale: Secondary | ICD-10-CM | POA: Insufficient documentation

## 2017-06-16 DIAGNOSIS — D6861 Antiphospholipid syndrome: Secondary | ICD-10-CM | POA: Insufficient documentation

## 2017-06-16 LAB — ANTISTREPTOLYSIN O TITER: ASO: 337 IU/mL — ABNORMAL HIGH (ref 0.0–200.0)

## 2017-06-16 NOTE — Care Management (Signed)
CM spoke with [facility] 6/19 @ 5pm. They will establish care with pt. Pt seen in specialty clinic this AM, instructed to go by clinic after she leaves to make appointment and pick up new pt packet, also needs to call HD and cancel appointment.

## 2017-06-16 NOTE — Progress Notes (Signed)
[facility] Initial Visit:  Patient Care Team: Patient, No Pcp Per as PCP - General (General Practice)  CHIEF COMPLAINTS/PURPOSE OF CONSULTATION:  Recurrent PE  HISTORY OF PRESENTING ILLNESS: [name] 26 y.o. female is here because of recurrent PE. Patient states that she had her first pulmonary embolism about 7 years ago after she had her son. Patient was found at that time to have antiphospholipid syndrome. She was on warfarin for 2 years and then was subsequently taken off of anticoagulation. Then 1 year after she was off of warfarin, she developed another pulmonary embolism and was placed on Xarelto. She took Xarelto for 6 months but then discontinued it due to cost issues. In January 2018, patient presented to [facility] ED for trauma and had a CT chest with contrast performed on 01/19/17 which demonstrated an acute pulmonary emboli involving the segmental pulmonary artery supplying the left lower lobe, and probably right lower lobe as well. No evidence for right heart strain. She was placed on xarelto but could not afford it. Patient came into the ED again on 06/15/17 due to SOB, cough, chest pain. She had a CTA performed on 06/15/17 which demonstrated rounded area of both dense solid and ground-glass attenuation within the superior segment of the right lower lobe. It is new from the prior exam from January of 2018 and given the clinical history likely represents a focal pneumonic infiltrate. A portion of this may also be related to pulmonary infarct given the history of pulmonary embolism in this region. Patient was placed on warfarin 5 mg PO daily and was given therapeutic lovenox. Patient states she took the warfarin yesterday and will be getting her lovenox filled today.   I spoke with the ED physician yesterday who has asked [place] to see the patient for INR checks until she can get established with a PCP.  Patient does have an appointment with the [facility] clinic today for  establishment of PCP. Today she continues to complain of fatigue, SOB, and states that she coughed up some blood today while she was coughing. She has pleuritic chest pain.   Review of Systems  Constitutional: Positive for fatigue. Negative for appetite change, chills and fever.  HENT:   Negative for hearing loss, lump/mass, mouth sores, sore throat and tinnitus.   Eyes: Negative for eye problems and icterus.  Respiratory: Positive for cough and shortness of breath. Negative for chest tightness, hemoptysis and wheezing.        Coughing up a little blood  Cardiovascular: Positive for chest pain. Negative for leg swelling and palpitations.  Gastrointestinal: Negative for abdominal distention, abdominal pain, blood in stool, diarrhea, nausea and vomiting.  Endocrine: Negative.  Negative for hot flashes.  Genitourinary: Negative for difficulty urinating, frequency and hematuria.   Musculoskeletal: Negative for arthralgias and neck pain.  Skin: Negative for itching and rash.  Neurological: Negative for dizziness, headaches and speech difficulty.  Hematological: Negative for adenopathy. Does not bruise/bleed easily.  Psychiatric/Behavioral: Negative for confusion. The patient is not nervous/anxious.     MEDICAL HISTORY: Past Medical History:  Diagnosis Date  . Abnormal Pap smear   . Anemia   . Anticoagulant long-term use   . Antiphospholipid antibody with hypercoagulable state ([place])   . Anxiety   . Anxiety and depression    suicide attempt-age 19  . Asthma    ABG in 11/2010:7.45, 34, 79., ventolin rescue inhaler last use 03/21/12  . Attention deficit hyperactivity disorder   . Bipolar disorder (  [place])    not on meds  . Cough productive of purulent sputum   . Depression    suicide attempt at age 50  . Dermatitis due to detergents   . Edema in pregnancy   . GERD (gastroesophageal reflux disease)   . HIV positive ([place])    Western Blot negative  . Lupus anticoagulant disorder [facility])     Previously followed by Dr. Lieutenant Diego of discharged from his care due to medical noncompliance.  . Panic attacks    not on meds  . Pulmonary embolism ([place]) 06/2010   Multiple bilateral pulmonary emboli diagnosed by CT scan in in 06/2010.  Moderate size pleural effusion and right lower lobe infarction present at that time.  Lupus anticoagulant was present, but all other aspects of her hypercoagulable evaluation were negative including all Cardiolite and antibodies.    . Recurrent upper respiratory infection (URI)   . Shortness of breath    has asthma- scheduled for PFTs on 04/06/12  . Substance abuse    cocaine, opiates, marijuana; alcohol; most recent positive test for cocaine was in December of 2011; alcohol levels have been as high as 302  . Tobacco abuse     SURGICAL HISTORY: Past Surgical History:  Procedure Laterality Date  . CESAREAN SECTION  2011, 2009  . CESAREAN SECTION  04/08/2012   Procedure: CESAREAN SECTION;  Surgeon: Donnamae Jude, MD;  Location: [facility];  Service: Gynecology;  Laterality: N/A;  . HEMANGIOMA EXCISION  2013   R wrist  . TUBAL LIGATION  04/08/2012   Procedure: BILATERAL TUBAL LIGATION;  Surgeon: Donnamae Jude, MD;  Location: [place] [facility];  Service: Gynecology;  Laterality: Bilateral;  . WRIST SURGERY  2009   hemoangioma removed from right wrist    SOCIAL HISTORY: Social History   Social History  . Marital status: Single    Spouse name: N/A  . Number of children: 3  . Years of education: N/A   Occupational History  . unemployed Unemployed   Social History Main Topics  . Smoking status: Former Smoker    Packs/day: 0.50    Types: Cigarettes    Quit date: 08/28/2014  . Smokeless tobacco: Never Used  . Alcohol use Yes     Comment: 4-8 glasses of wine daily  . Drug use: Yes    Types: Marijuana, Other-see comments     Comment: former drug abuse, cocaine about 5 years ago but denies IV drug abuse  . Sexual activity: Yes    Birth control/ protection:  None   Other Topics Concern  . Not on file   Social History Narrative  . No narrative on file    FAMILY HISTORY Family History  Problem Relation Age of Onset  . Drug abuse Mother        History   . Depression Mother   . Drug abuse Father   . Heart attack Father 38       H/o drug abuse  . Depression Sister   . Depression Maternal Grandmother   . Diabetes Maternal Grandfather   . Anesthesia problems Neg Hx   . Hypotension Neg Hx   . Malignant hyperthermia Neg Hx   . Pseudochol deficiency Neg Hx   . Colon cancer Neg Hx     ALLERGIES:  is allergic to tramadol.  MEDICATIONS:  Current Outpatient Prescriptions  Medication Sig Dispense Refill  . amoxicillin-clavulanate (AUGMENTIN) 875-125 MG tablet Take 1 tablet by mouth every 12 (twelve) hours. 14 tablet 0  .  enoxaparin (LOVENOX) 30 MG/0.3ML injection Inject 1.5 mLs (150 mg total) into the skin daily. 7.5 mL 0  . HYDROcodone-homatropine (HYCODAN) 5-1.5 MG/5ML syrup Take 5 mLs by mouth every 6 (six) hours as needed. For cough 120 mL 0  . warfarin (COUMADIN) 5 MG tablet Take 1 tablet (5 mg total) by mouth daily. 30 tablet 0   No current facility-administered medications for this visit.     PHYSICAL EXAMINATION:  BP 104/68, P 103, RR 18, Temp 97.9, O2 Sat 100%   Physical Exam  Constitutional: She is oriented to person, place, and time and well-developed, well-nourished, and in no distress. No distress.  HENT:  Head: Normocephalic and atraumatic.  Mouth/Throat: No oropharyngeal exudate.  Eyes: Conjunctivae are normal. Pupils are equal, round, and reactive to light. No scleral icterus.  Neck: Normal range of motion. Neck supple. No JVD present.  Cardiovascular: Normal rate, regular rhythm and normal heart sounds.  Exam reveals no gallop and no friction rub.   No murmur heard. Pulmonary/Chest: Breath sounds normal. No respiratory distress. She has no wheezes. She has no rales.  Abdominal: Soft. Bowel sounds are normal. She  exhibits no distension. There is no tenderness. There is no guarding.  Musculoskeletal: She exhibits no edema or tenderness.  Lymphadenopathy:    She has no cervical adenopathy.  Neurological: She is alert and oriented to person, place, and time. No cranial nerve deficit.  Skin: Skin is warm and dry. No rash noted. No erythema. No pallor.  Psychiatric: Affect and judgment normal.     LABORATORY DATA: I have personally reviewed the data as listed:  Admission on 06/15/2017, Discharged on 06/15/2017  Component Date Value Ref Range Status  . WBC 06/15/2017 11.4* 4.0 - 10.5 K/uL Final  . RBC 06/15/2017 3.74* 3.87 - 5.11 MIL/uL Final  . Hemoglobin 06/15/2017 11.7* 12.0 - 15.0 g/dL Final  . HCT 06/15/2017 35.3* 36.0 - 46.0 % Final  . MCV 06/15/2017 94.4  78.0 - 100.0 fL Final  . MCH 06/15/2017 31.3  26.0 - 34.0 pg Final  . MCHC 06/15/2017 33.1  30.0 - 36.0 g/dL Final  . RDW 06/15/2017 17.2* 11.5 - 15.5 % Final  . Platelets 06/15/2017 323  150 - 400 K/uL Final  . Neutrophils Relative % 06/15/2017 78  % Final  . Neutro Abs 06/15/2017 8.9* 1.7 - 7.7 K/uL Final  . Lymphocytes Relative 06/15/2017 14  % Final  . Lymphs Abs 06/15/2017 1.6  0.7 - 4.0 K/uL Final  . Monocytes Relative 06/15/2017 7  % Final  . Monocytes Absolute 06/15/2017 0.8  0.1 - 1.0 K/uL Final  . Eosinophils Relative 06/15/2017 1  % Final  . Eosinophils Absolute 06/15/2017 0.2  0.0 - 0.7 K/uL Final  . Basophils Relative 06/15/2017 0  % Final  . Basophils Absolute 06/15/2017 0.0  0.0 - 0.1 K/uL Final  . Prothrombin Time 06/15/2017 13.3  11.4 - 15.2 seconds Final  . INR 06/15/2017 1.01   Final  . Sodium 06/15/2017 137  135 - 145 mmol/L Final  . Potassium 06/15/2017 3.6  3.5 - 5.1 mmol/L Final  . Chloride 06/15/2017 102  101 - 111 mmol/L Final  . CO2 06/15/2017 25  22 - 32 mmol/L Final  . Glucose, Bld 06/15/2017 93  65 - 99 mg/dL Final  . BUN 06/15/2017 9  6 - 20 mg/dL Final  . Creatinine, Ser 06/15/2017 0.73  0.44 - 1.00  mg/dL Final  . Calcium 06/15/2017 8.1* 8.9 - 10.3 mg/dL Final  .  GFR calc non Af Amer 06/15/2017 >60  >60 mL/min Final  . GFR calc Af Amer 06/15/2017 >60  >60 mL/min Final   Comment: (NOTE) The eGFR has been calculated using the CKD EPI equation. This calculation has not been validated in all clinical situations. eGFR's persistently <60 mL/min signify possible Chronic Kidney Disease.   . Anion gap 06/15/2017 10  5 - 15 Final  . Troponin I 06/15/2017 <0.03  <0.03 ng/mL Final  . ASO 06/15/2017 337.0* 0.0 - 200.0 IU/mL Final   Comment: (NOTE) Performed At: [facility] [place], [place] [id number] Lindon Romp MD [id number]     RADIOGRAPHIC STUDIES: I have personally reviewed the radiological images as listed and agree with the findings in the report  Dg Chest 2 View  Result Date: 06/15/2017 CLINICAL DATA:  Productive cough EXAM: CHEST  2 VIEW COMPARISON:  01/19/2017 FINDINGS: The heart size and mediastinal contours are within normal limits. Both lungs are clear. The visualized skeletal structures are unremarkable. IMPRESSION: No active cardiopulmonary disease. Electronically Signed   By: Inez Catalina M.D.   On: 06/15/2017 09:29   Ct Angio Chest Pe W Or Wo Contrast  Result Date: 06/15/2017 CLINICAL DATA:  Intermittent cough for several years, history of prior pulmonary embolism EXAM: CT ANGIOGRAPHY CHEST WITH CONTRAST TECHNIQUE: Multidetector CT imaging of the chest was performed using the standard protocol during bolus administration of intravenous contrast. Multiplanar CT image reconstructions and MIPs were obtained to evaluate the vascular anatomy. CONTRAST:  75 mL Isovue 370. COMPARISON:  06/15/2017, 01/19/2017 FINDINGS: Cardiovascular: Thoracic aorta demonstrates a normal branching pattern. No aneurysmal dilatation dissection is seen. No significant atherosclerotic calcifications are noted. No cardiac enlargement is seen. The opacification of the pulmonary  artery is slightly limited although no definitive pulmonary embolism is seen. Mediastinum/Nodes: The thoracic inlet is unremarkable. No significant spinal adenopathy is seen. Some small right hilar lymph nodes are noted. The largest of these measures 1 cm in short axis. Lungs/Pleura: The lungs are well aerated bilaterally. No sizable effusion is seen. There is a mixed solid and ground-glass area of opacification within the right lower lobe. It measures approximately 4 cm in greatest dimension. The solid component measures approximately 2.7 cm in greatest dimension. Air bronchograms are noted within and given the abrupt onset when compared with the prior exam this likely represents a focal round pneumonia. Partial infarction would also deserve consideration given the patient's known history of prior pulmonary embolism. Upper Abdomen: Diffuse decreased attenuation is noted within the liver consistent with fatty infiltration. The remainder the upper abdomen is within normal limits. Musculoskeletal: No acute abnormality noted. Review of the MIP images confirms the above findings. IMPRESSION: Rounded area of both dense solid and ground-glass attenuation within the superior segment of the right lower lobe. It is new from the prior exam from January of 2018 and given the clinical history likely represents a focal pneumonic infiltrate. A portion of this may also be related to pulmonary infarct given the history of pulmonary embolism in this region. It was not well appreciated on recent chest x-ray due to its more posterior location. Follow-up CT following appropriate therapy is recommended in 6-8 weeks. Chronic changes as described above. Electronically Signed   By: Inez Catalina M.D.   On: 06/15/2017 10:11    ASSESSMENT/PLAN 26 year old female with a h/o antiphospholipid syndrome and recurrent pulmonary emboli, most recently in Jan 2018. Diagnosed with community acquired pneumonia yesterday and placed on  augmentin.  She will need lifelong anticoagulation given her h/o antiphospholipid syndrome and recurrent PE. Started on anticoagulation on Warfarin on 06/15/17. Recommended for patient to get her lovenox filled today for bridging. Goal is to get her INR to 2-3 range. I have counseled her on compliance with coumadin diet.  I will plan to check her INR on 06/18/17 and 06/21/17 to make adjustments to coumadin as necessary. Patient does have an appointment with the [facility] clinic today for establishment of PCP. Once she gets established with them I will turn over her INR management to them.  RTC in 2 weeks for follow up. However I have told her that if she gets established with [facility] then she can cancel the appointment.   Orders Placed This Encounter  Procedures  . Protime-INR    Standing Status:   Future    Standing Expiration Date:   06/16/2018  . Protime-INR    Standing Status:   Future    Standing Expiration Date:   06/16/2018    All questions were answered. The patient knows to call the clinic with any problems, questions or concerns.  This note was electronically signed.    Twana First, MD  06/16/2017 9:00 AM

## 2017-06-18 ENCOUNTER — Other Ambulatory Visit (HOSPITAL_COMMUNITY): Payer: Self-pay

## 2017-06-21 ENCOUNTER — Encounter (HOSPITAL_COMMUNITY): Payer: Self-pay

## 2017-06-21 DIAGNOSIS — D6861 Antiphospholipid syndrome: Secondary | ICD-10-CM

## 2017-06-21 DIAGNOSIS — I2699 Other pulmonary embolism without acute cor pulmonale: Secondary | ICD-10-CM

## 2017-06-21 LAB — PROTIME-INR
INR: 1.51
Prothrombin Time: 18.4 seconds — ABNORMAL HIGH (ref 11.4–15.2)

## 2017-07-01 ENCOUNTER — Ambulatory Visit (HOSPITAL_COMMUNITY): Payer: Self-pay

## 2017-10-18 ENCOUNTER — Encounter (HOSPITAL_COMMUNITY): Payer: Self-pay | Admitting: Emergency Medicine

## 2017-10-18 ENCOUNTER — Emergency Department (HOSPITAL_COMMUNITY)
Admission: EM | Admit: 2017-10-18 | Discharge: 2017-10-18 | Disposition: A | Discharge status: Home / Self Care | Payer: Self-pay | Attending: Emergency Medicine | Admitting: Emergency Medicine

## 2017-10-18 DIAGNOSIS — Z86711 Personal history of pulmonary embolism: Secondary | ICD-10-CM | POA: Insufficient documentation

## 2017-10-18 DIAGNOSIS — Z79899 Other long term (current) drug therapy: Secondary | ICD-10-CM | POA: Insufficient documentation

## 2017-10-18 DIAGNOSIS — J45909 Unspecified asthma, uncomplicated: Secondary | ICD-10-CM | POA: Insufficient documentation

## 2017-10-18 DIAGNOSIS — L245 Irritant contact dermatitis due to other chemical products: Secondary | ICD-10-CM | POA: Insufficient documentation

## 2017-10-18 DIAGNOSIS — Z7901 Long term (current) use of anticoagulants: Secondary | ICD-10-CM | POA: Insufficient documentation

## 2017-10-18 MED ORDER — HYDROCODONE-ACETAMINOPHEN 5-325 MG PO TABS
1.0000 | ORAL_TABLET | Freq: Once | ORAL | Status: AC
  Administered 2017-10-18: 1 via ORAL
  Filled 2017-10-18: qty 1

## 2017-10-18 MED ORDER — PREDNISONE 10 MG PO TABS
60.0000 mg | ORAL_TABLET | Freq: Once | ORAL | Status: AC
  Administered 2017-10-18: 60 mg via ORAL
  Filled 2017-10-18: qty 1

## 2017-10-18 MED ORDER — PREDNISONE 20 MG PO TABS: 40.0000 mg | ORAL_TABLET | Freq: Every day | ORAL | 0 refills | Status: AC

## 2017-10-18 NOTE — ED Triage Notes (Signed)
Colored hair x 2 days ago.  Face , ear and eye swollen.  Rash to ear and face.
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
## 2017-10-20 ENCOUNTER — Encounter (HOSPITAL_COMMUNITY): Payer: Self-pay | Admitting: Cardiology

## 2017-10-20 ENCOUNTER — Emergency Department (HOSPITAL_COMMUNITY)
Admission: EM | Admit: 2017-10-20 | Discharge: 2017-10-20 | Disposition: A | Discharge status: Home / Self Care | Payer: Self-pay | Attending: Emergency Medicine | Admitting: Emergency Medicine

## 2017-10-20 DIAGNOSIS — Z79899 Other long term (current) drug therapy: Secondary | ICD-10-CM | POA: Insufficient documentation

## 2017-10-20 DIAGNOSIS — L252 Unspecified contact dermatitis due to dyes: Secondary | ICD-10-CM | POA: Insufficient documentation

## 2017-10-20 DIAGNOSIS — Z87891 Personal history of nicotine dependence: Secondary | ICD-10-CM | POA: Insufficient documentation

## 2017-10-20 DIAGNOSIS — J45909 Unspecified asthma, uncomplicated: Secondary | ICD-10-CM | POA: Insufficient documentation

## 2017-10-20 DIAGNOSIS — L253 Unspecified contact dermatitis due to other chemical products: Secondary | ICD-10-CM

## 2017-10-20 DIAGNOSIS — T7840XD Allergy, unspecified, subsequent encounter: Secondary | ICD-10-CM | POA: Insufficient documentation

## 2017-10-20 MED ORDER — METHYLPREDNISOLONE SODIUM SUCC 125 MG IJ SOLR
125.0000 mg | Freq: Once | INTRAMUSCULAR | Status: AC
  Administered 2017-10-20: 125 mg via INTRAVENOUS
  Filled 2017-10-20: qty 2

## 2017-10-20 MED ORDER — DIPHENHYDRAMINE HCL 50 MG/ML IJ SOLN
50.0000 mg | Freq: Once | INTRAMUSCULAR | Status: AC
  Administered 2017-10-20: 50 mg via INTRAVENOUS
  Filled 2017-10-20: qty 1

## 2017-10-20 MED ORDER — IBUPROFEN 800 MG PO TABS
800.0000 mg | ORAL_TABLET | Freq: Once | ORAL | Status: AC
  Administered 2017-10-20: 800 mg via ORAL
  Filled 2017-10-20: qty 1

## 2017-10-20 MED ORDER — ACETAMINOPHEN 325 MG PO TABS
650.0000 mg | ORAL_TABLET | Freq: Once | ORAL | Status: AC
  Administered 2017-10-20: 650 mg via ORAL
  Filled 2017-10-20: qty 2

## 2017-10-20 MED ORDER — BACITRACIN ZINC 500 UNIT/GM EX OINT
TOPICAL_OINTMENT | Freq: Once | CUTANEOUS | Status: AC
  Administered 2017-10-20: 16:00:00 via TOPICAL
  Filled 2017-10-20: qty 0.9

## 2017-10-20 MED ORDER — FAMOTIDINE IN NACL 20-0.9 MG/50ML-% IV SOLN
20.0000 mg | Freq: Once | INTRAVENOUS | Status: AC
  Administered 2017-10-20: 20 mg via INTRAVENOUS
  Filled 2017-10-20: qty 50

## 2017-10-20 NOTE — Discharge Instructions (Signed)
Take your prednisone prescription as previously directed.  Take over the counter benadryl, as directed on packaging, as needed for itching.  If the benadryl is too sedating, take an over the counter non-sedating antihistamine such as claritin, allegra or zyrtec, as directed on packaging.  Call your regular medical doctor tomorrow to schedule a follow up appointment within the next 2 days.  Return to the Emergency Department immediately sooner if worsening.
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
## 2017-10-20 NOTE — ED Triage Notes (Signed)
Facial swelling,  Unable to open to eyes.  Onset this morning.  Seen here with same Monday.  Pt states she feels SOB.

## 2019-09-04 ENCOUNTER — Emergency Department (HOSPITAL_COMMUNITY)
Admission: EM | Admit: 2019-09-04 | Discharge: 2019-09-04 | Disposition: A | Discharge status: Home / Self Care | Payer: Self-pay | Attending: Emergency Medicine | Admitting: Emergency Medicine

## 2019-09-04 ENCOUNTER — Other Ambulatory Visit: Payer: Self-pay

## 2019-09-04 ENCOUNTER — Encounter (HOSPITAL_COMMUNITY): Payer: Self-pay | Admitting: Emergency Medicine

## 2019-09-04 DIAGNOSIS — F1910 Other psychoactive substance abuse, uncomplicated: Secondary | ICD-10-CM

## 2019-09-04 DIAGNOSIS — J45909 Unspecified asthma, uncomplicated: Secondary | ICD-10-CM | POA: Insufficient documentation

## 2019-09-04 DIAGNOSIS — Z87891 Personal history of nicotine dependence: Secondary | ICD-10-CM | POA: Insufficient documentation

## 2019-09-04 DIAGNOSIS — F1510 Other stimulant abuse, uncomplicated: Secondary | ICD-10-CM | POA: Insufficient documentation

## 2019-09-04 MED ORDER — LORAZEPAM 2 MG/ML IJ SOLN
2.0000 mg | Freq: Once | INTRAMUSCULAR | Status: AC
  Administered 2019-09-04: 2 mg via INTRAMUSCULAR
  Filled 2019-09-04: qty 1

## 2019-09-04 NOTE — Discharge Instructions (Signed)
Substance Abuse Treatment Programs ° °Intensive Outpatient Programs °[facility]     °[address]      °[place], [place]                   °[phone]      ° °[facility] °[address] °[place], [place] °[phone] ° °[facility]     °(Inpatient and outpatient)     °[address]           °[phone]   ° °[facility] °[phone] (Suboxone and Methadone) ° °[address]      °[place], [place] [address]      °[phone]      ° °[address] °[place], [place] °[phone] ° °[facility] (Outpatient/Inpatient, [facility])    °(insurance only) [phone]      °       °[facility] (Groups & Residential) °[place], [place] °[phone] ° °   °[facility]     °[address]     °[place], [place]      °[phone]      ° °[facility] (for caregivers and family) °[address] °[place], [place] °336-299-4655 ° ° ° ° ° °Residential Treatment Programs °[facility]      °[address], [place], [place] [address]  °[phone]      ° °[facility] °[address] [place], [place] [address] °[phone] ° °[facility]        °[phone]      ° °[facility] °[phone] ° °[facility]             °[address]                                         °[place], [place]                                                °[phone] or [phone]                              ° °[facility] [place] °[address] °[place], [address] °[phone] ° °[facility]    °[address]      °[place], [place]     °[phone]      ° °[facility] °[address] °, [place] °[phone] ° °[facility]   °[address]     °[place], [place] [address]     °[phone]      °Admissions: 8am-3pm M-F ° °[facility] °[address] °[place] °[phone] ° °[facility]: Residential Program (90 Days)   °[place], [place]      °336-725-8389 or [phone]    ° °[facility] °[place], [place] °(Walk in Hours over the weekend or by referral) ° °[facility] °[address], [place],  [address] °([phone] ° °Crisis Mobile: [facility]:  1-877-626-1772 (for crisis response 24 hours a day) °[facility] Hotline:      1-800-256-2452 °Outpatient Psychiatry and Counseling ° °[facility]: Mobile Crisis   Management 24 hours:  1-877-626-1772 ° °[facility] sliding scale fee and walk in schedule: M-F 8am-12pm/1pm-3pm °[address]  °[place], [place] [address] °[phone] ° °[facility] °[address] °[place], [place] [address] °[phone] ° °[facility] (Former[facility] known [facility])- new patient walk-in appointments available Monday - Friday 8am -3pm.          °[address] °[place], [place] [address] °336-676-6840 or crisis line- 336-676-6905 ° °[facility]/ Intensive Outpatient Therapy Program °[address] °[place], [place] [address] °336-832-9804 ° °[facility]                  °Crisis Services      °[phone]      °[address]     °[place], [place] [address]                ° °[facility]   °[facility] °[phone] °[address] °[place], [place] [address] ° ° °[facility]          °[address]  °[place], [place] [address]       °([phone] ° °[facility] °[address] °[place], [place] [address] °336-292-1510 ° °[facility]    °[address]    °[place], [place] [address]     °[phone]      ° °Parish McKinney, MD     °[address]     °[place] [address]     °[phone]     °  °[facility] °[address] °[place] [address] ° °[facility]     °[address]     °[place], [place]      °336-542-2076      ° °[facility] °Shamsher Ahluwalia, MD °[address] °[place], [place] [address] °[phone] ° °[facility]     °[address]     °[place], [place] [address]     °336-274-1237      ° °[facility] °[address] °[place], [place] [address] °336-854-4450 °Resources for Temporary Residential Assistance/Crisis Centers ° °DAY CENTERS °[facility] °M-F 8am-3pm   °[address] [place], [place] [address]   336-332-0824 °Services include: laundry, barbering, support groups, case management, phone  & computer access, showers, AA/NA mtgs, mental health/substance abuse nurse, job skills class, disability information, VA assistance, spiritual classes, etc.  ° °HOMELESS SHELTERS ° °[facility]     °[facility]   °[address], [place]     °[phone]       °       °[facility] (women and children)       °[address] °[place], [place] [address] °336-275-0820 °[email or web address] for application and process °Application Required ° °[facility]   °[address]    °[place] [address]     °[phone]       °             °[facility] °[address] °, [place] [address] °336.273.5572 °336-235-0363(schedule application appt.) °Application Required ° °[facility] (women only)    °[address]     °[place], [place] [address]     °336-884-1039      °  Intake starts 6pm daily °Need valid ID, SSC, & Police report °[facility] [place] °[address] °[place], [place] °336-881-5420 °Application Required ° °[facility] (men only)     °[address]      °[place], [place]     °[phone]      ° °[facility] °(Pregnant women only) °[address] °[place], [place] °336-275-0206 ° °[facility]      °[address]      °[place], [place] [address]     °[phone]      °       °[facility] °[address] °[place], [place] °336-723-1848 °90 day commitment/SA/Application process ° °[facility](men only)     °[address]     °[place], [place]     °[phone]       °Check-in at 7pm     °       °[facility] °[address] °[place], [place] [address] °336-248-6684 °Men/Women/Women and Children must be there by 7 pm ° °[facility] °[place], [place] °336-722-8721                ° °
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
## 2019-09-04 NOTE — ED Notes (Signed)
Pt given phone to attempt to find transportation home.

## 2019-09-04 NOTE — ED Triage Notes (Signed)
Pt called EMS because she has been up for a week and wants something to help her sleep. States she is a new crystal meth user (using x 1 month). Pt with generalized bruising d/t multiple falls from recent drug use.

## 2019-11-20 ENCOUNTER — Emergency Department (HOSPITAL_COMMUNITY)
Admission: EM | Admit: 2019-11-20 | Discharge: 2019-11-20 | Disposition: A | Discharge status: Home / Self Care | Payer: Self-pay | Attending: Emergency Medicine | Admitting: Emergency Medicine

## 2019-11-20 ENCOUNTER — Emergency Department (HOSPITAL_COMMUNITY): Payer: Self-pay

## 2019-11-20 ENCOUNTER — Encounter (HOSPITAL_COMMUNITY): Payer: Self-pay | Admitting: Emergency Medicine

## 2019-11-20 ENCOUNTER — Other Ambulatory Visit: Payer: Self-pay

## 2019-11-20 DIAGNOSIS — F17210 Nicotine dependence, cigarettes, uncomplicated: Secondary | ICD-10-CM | POA: Insufficient documentation

## 2019-11-20 DIAGNOSIS — J9859 Other diseases of mediastinum, not elsewhere classified: Secondary | ICD-10-CM | POA: Insufficient documentation

## 2019-11-20 DIAGNOSIS — R0789 Other chest pain: Secondary | ICD-10-CM | POA: Insufficient documentation

## 2019-11-20 DIAGNOSIS — J45909 Unspecified asthma, uncomplicated: Secondary | ICD-10-CM | POA: Insufficient documentation

## 2019-11-20 DIAGNOSIS — Z21 Asymptomatic human immunodeficiency virus [HIV] infection status: Secondary | ICD-10-CM | POA: Insufficient documentation

## 2019-11-20 DIAGNOSIS — Z79899 Other long term (current) drug therapy: Secondary | ICD-10-CM | POA: Insufficient documentation

## 2019-11-20 LAB — CBC
HCT: 38 % (ref 36.0–46.0)
Hemoglobin: 11.9 g/dL — ABNORMAL LOW (ref 12.0–15.0)
MCH: 27.7 pg (ref 26.0–34.0)
MCHC: 31.3 g/dL (ref 30.0–36.0)
MCV: 88.4 fL (ref 80.0–100.0)
Platelets: 392 10*3/uL (ref 150–400)
RBC: 4.3 MIL/uL (ref 3.87–5.11)
RDW: 15.5 % (ref 11.5–15.5)
WBC: 5.4 10*3/uL (ref 4.0–10.5)
nRBC: 0 % (ref 0.0–0.2)

## 2019-11-20 LAB — BASIC METABOLIC PANEL
Anion gap: 8 (ref 5–15)
BUN: 9 mg/dL (ref 6–20)
CO2: 24 mmol/L (ref 22–32)
Calcium: 8.8 mg/dL — ABNORMAL LOW (ref 8.9–10.3)
Chloride: 102 mmol/L (ref 98–111)
Creatinine, Ser: 0.78 mg/dL (ref 0.44–1.00)
GFR calc Af Amer: >60 mL/min (ref >60)
GFR calc non Af Amer: >60 mL/min (ref >60)
Glucose, Bld: 101 mg/dL — ABNORMAL HIGH (ref 70–99)
Potassium: 3.9 mmol/L (ref 3.5–5.1)
Sodium: 134 mmol/L — ABNORMAL LOW (ref 135–145)

## 2019-11-20 LAB — HEPATIC FUNCTION PANEL
ALT: 24 U/L (ref 0–44)
AST: 27 U/L (ref 15–41)
Albumin: 3.8 g/dL (ref 3.5–5.0)
Alkaline Phosphatase: 90 U/L (ref 38–126)
Bilirubin, Direct: 0.1 mg/dL (ref 0.0–0.2)
Indirect Bilirubin: 1 mg/dL — ABNORMAL HIGH (ref 0.3–0.9)
Total Bilirubin: 1.1 mg/dL (ref 0.3–1.2)
Total Protein: 8.8 g/dL — ABNORMAL HIGH (ref 6.5–8.1)

## 2019-11-20 LAB — POC URINE PREG, ED: Preg Test, Ur: NEGATIVE

## 2019-11-20 LAB — TROPONIN I (HIGH SENSITIVITY)
Troponin I (High Sensitivity): <2 ng/L (ref <18)
Troponin I (High Sensitivity): <2 ng/L (ref <18)

## 2019-11-20 LAB — LIPASE, BLOOD: Lipase: 21 U/L (ref 11–51)

## 2019-11-20 MED ORDER — MORPHINE SULFATE (PF) 4 MG/ML IV SOLN
4.0000 mg | Freq: Once | INTRAVENOUS | Status: AC
  Administered 2019-11-20: 13:00:00 4 mg via INTRAVENOUS
  Filled 2019-11-20: qty 1

## 2019-11-20 MED ORDER — SODIUM CHLORIDE 0.9% FLUSH
3.0000 mL | Freq: Once | INTRAVENOUS | Status: AC
  Administered 2019-11-20: 3 mL via INTRAVENOUS

## 2019-11-20 MED ORDER — METHOCARBAMOL 500 MG PO TABS: 500.0000 mg | ORAL_TABLET | Freq: Two times a day (BID) | ORAL | 0 refills | Status: DC

## 2019-11-20 MED ORDER — MORPHINE SULFATE (PF) 4 MG/ML IV SOLN
4.0000 mg | Freq: Once | INTRAVENOUS | Status: AC
  Administered 2019-11-20: 15:00:00 4 mg via INTRAVENOUS
  Filled 2019-11-20: qty 1

## 2019-11-20 MED ORDER — NAPROXEN 500 MG PO TABS: 500.0000 mg | ORAL_TABLET | Freq: Two times a day (BID) | ORAL | 0 refills | Status: AC

## 2019-11-20 MED ORDER — KETOROLAC TROMETHAMINE 30 MG/ML IJ SOLN
30.0000 mg | Freq: Once | INTRAMUSCULAR | Status: AC
  Administered 2019-11-20: 16:00:00 30 mg via INTRAVENOUS
  Filled 2019-11-20: qty 1

## 2019-11-20 MED ORDER — ALBUTEROL SULFATE HFA 108 (90 BASE) MCG/ACT IN AERS: 1.0000 | INHALATION_SPRAY | RESPIRATORY_TRACT | 0 refills | Status: AC | PRN

## 2019-11-20 MED ORDER — IOHEXOL 350 MG/ML SOLN
150.0000 mL | Freq: Once | INTRAVENOUS | Status: AC | PRN
  Administered 2019-11-20: 100 mL via INTRAVENOUS

## 2019-11-20 MED ORDER — FENTANYL CITRATE (PF) 100 MCG/2ML IJ SOLN: 25.0000 ug | Freq: Once | INTRAMUSCULAR | Status: DC

## 2019-11-20 NOTE — Discharge Instructions (Addendum)
It is important for you to follow-up with the oncologist for further work-up of the mass found in the CT of your chest today. Take the medications as needed to help with your pain. Return to the ED if you start to develop worsening symptoms, increased shortness of breath, leg swelling or chest pain.

## 2019-11-20 NOTE — Progress Notes (Signed)
Consult request has been received. CSW attempting to follow up at present time  Takiera Mayo M. Ange Puskas LCSWA Transitions of Care  Clinical Social Worker  Ph: [phone] 
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
## 2019-11-20 NOTE — ED Triage Notes (Signed)
Patient complaining of pain under right breast and shortness of breath x 2 days. States she was coughing up blood this morning. States she has history of PE 5 years ago but is not taking blood thinners.

## 2019-11-30 ENCOUNTER — Other Ambulatory Visit: Payer: Self-pay

## 2019-11-30 ENCOUNTER — Encounter (HOSPITAL_COMMUNITY): Payer: Self-pay | Admitting: Surgery

## 2019-11-30 ENCOUNTER — Inpatient Hospital Stay (HOSPITAL_COMMUNITY): Payer: Self-pay | Attending: Hematology | Admitting: Hematology

## 2019-11-30 ENCOUNTER — Inpatient Hospital Stay (HOSPITAL_COMMUNITY): Payer: Self-pay

## 2019-11-30 ENCOUNTER — Encounter (HOSPITAL_COMMUNITY): Payer: Self-pay | Admitting: Hematology

## 2019-11-30 VITALS — BP 143/96 | HR 124 | Temp 97.3°F | Resp 14 | Wt 196.8 lb

## 2019-11-30 DIAGNOSIS — D4989 Neoplasm of unspecified behavior of other specified sites: Secondary | ICD-10-CM

## 2019-11-30 DIAGNOSIS — Z23 Encounter for immunization: Secondary | ICD-10-CM | POA: Insufficient documentation

## 2019-11-30 DIAGNOSIS — R222 Localized swelling, mass and lump, trunk: Secondary | ICD-10-CM | POA: Insufficient documentation

## 2019-11-30 DIAGNOSIS — J9589 Other postprocedural complications and disorders of respiratory system, not elsewhere classified: Secondary | ICD-10-CM

## 2019-11-30 DIAGNOSIS — Z86711 Personal history of pulmonary embolism: Secondary | ICD-10-CM

## 2019-11-30 DIAGNOSIS — F17210 Nicotine dependence, cigarettes, uncomplicated: Secondary | ICD-10-CM

## 2019-11-30 LAB — TSH: TSH: 2.206 u[IU]/mL (ref 0.350–4.500)

## 2019-11-30 LAB — SEDIMENTATION RATE: Sed Rate: 31 mm/hr — ABNORMAL HIGH (ref 0–22)

## 2019-11-30 LAB — C-REACTIVE PROTEIN: CRP: <0.8 mg/dL (ref <1.0)

## 2019-11-30 LAB — LACTATE DEHYDROGENASE: LDH: 114 U/L (ref 98–192)

## 2019-11-30 MED ORDER — INFLUENZA VAC SPLIT QUAD 0.5 ML IM SUSY
0.5000 mL | PREFILLED_SYRINGE | Freq: Once | INTRAMUSCULAR | Status: AC
  Administered 2019-11-30: 14:00:00 0.5 mL via INTRAMUSCULAR
  Filled 2019-11-30: qty 0.5

## 2019-11-30 NOTE — Assessment & Plan Note (Signed)
1.  Anterior mediastinal mass: -She developed right anterior chest wall pain 2 days prior to the presentation to the ER on 11/20/2019. -CT chest PE protocol on 11/20/2019 shows no pulmonary embolus.  3.4 x 2.3 cm lobulated anterior mediastinal mass, increased compared to prior exam in 2018.  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 -She reports the pain as sharp lasting few seconds, worse on deep breathing and certain movements.  She has trouble sleeping at nighttime worrying about the mass.  Denies any fevers, night sweats or weight loss. -She is taking ibuprofen as needed for this pain. -I have recommended blood work including LDH, AFP, beta-hCG.  I have also recommended a PET scan.  2.  Recurrent pulmonary embolism: -She has been off of anticoagulation at this time.  Is not clear when she has been off of it. -She had pulmonary embolism in 2011 and 2018. -We will check lupus anticoagulant, anticardiolipin antibody and antibeta-2 glycoprotein antibody today.

## 2019-11-30 NOTE — Progress Notes (Signed)
[facility] CONSULT NOTE  Patient Care Team: Patient, No Pcp Per as PCP - General (General Practice)  CHIEF COMPLAINTS/PURPOSE OF CONSULTATION:  Anterior mediastinal mass.  HISTORY OF PRESENTING ILLNESS:  [name] 28 y.o. female is seen in evaluation for further work-up and management of anterior mediastinal mass incidentally found on CT PE protocol on 11/20/2019.  She started having chest pain on the right side couple of days prior to presenting to the ER.  Pain is worse on taking deep breath and on certain movements.  Pain is sharp and lasts few seconds.  She is having trouble sleeping at night.  She is very concerned about it.  Denies any fevers, night sweats or weight loss.  She reportedly had recurrent blood clots in the past in 2011 and 2018.  She has been off of anticoagulation at this time.  CT PE protocol in the ER did not show any pulmonary embolism.  She is accompanied by her boyfriend today.  She is trying to get on disability.  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  She smokes 8 cigarettes/day for the past 11 years.  She does drink occasional alcohol.  She smokes marijuana.  Denies any IV drug abuse.  Family history significant for paternal grandmother with throat cancer.  Maternal grandmother had liver cancer.  She is taking ibuprofen for her chest pain.  MEDICAL HISTORY:  Past Medical History:  Diagnosis Date  . Abnormal Pap smear   . Anemia   . Anticoagulant long-term use   . Antiphospholipid antibody with hypercoagulable state ([place])   . Anxiety   . Anxiety and depression    suicide attempt-age 28  . Asthma    ABG in 11/2010:7.45, 34, 79., ventolin rescue inhaler last use 03/21/12  . Attention deficit hyperactivity disorder   . Bipolar disorder ([place])    not on meds  . Cough productive of purulent sputum   . Depression    suicide attempt at age 64  . Dermatitis due to detergents   . Edema in pregnancy   . GERD (gastroesophageal reflux disease)   .  Lupus anticoagulant disorder [facility])    Previously followed by Dr. Lieutenant Diego of discharged from his care due to medical noncompliance.  . Panic attacks    not on meds  . Pulmonary embolism ([employer]) 06/2010   Multiple bilateral pulmonary emboli diagnosed by CT scan in in 06/2010.  Moderate size pleural effusion and right lower lobe infarction present at that time.  Lupus anticoagulant was present, but all other aspects of her hypercoagulable evaluation were negative including all Cardiolite and antibodies.    . Recurrent upper respiratory infection (URI)   . Shortness of breath    has asthma- scheduled for PFTs on 04/06/12  . Substance abuse ([place])    cocaine, opiates, marijuana; alcohol; most recent positive test for cocaine was in December of 2011; alcohol levels have been as high as 302  . Tobacco abuse     SURGICAL HISTORY: Past Surgical History:  Procedure Laterality Date  . CESAREAN SECTION  2011, 2009  . CESAREAN SECTION  04/08/2012   Procedure: CESAREAN SECTION;  Surgeon: Donnamae Jude, MD;  Location: [facility];  Service: Gynecology;  Laterality: N/A;  . HEMANGIOMA EXCISION  2013   R wrist  . TUBAL LIGATION  04/08/2012   Procedure: BILATERAL TUBAL LIGATION;  Surgeon: Donnamae Jude, MD;  Location: [facility];  Service: Gynecology;  Laterality: Bilateral;  . WRIST SURGERY  2009  hemoangioma removed from right wrist    SOCIAL HISTORY: Social History   Socioeconomic History  . Marital status: Single    Spouse name: Not on file  . Number of children: 3  . Years of education: Not on file  . Highest education level: Not on file  Occupational History  . Occupation: unemployed    Fish farm manager: UNEMPLOYED  Social Needs  . Financial resource strain: Not hard at all  . Food insecurity    Worry: Never true    Inability: Never true  . Transportation needs    Medical: No    Non-medical: No  Tobacco Use  . Smoking status: Current Every Day Smoker    Packs/day: 0.50    Years: 12.00     Pack years: 6.00    Types: Cigarettes  . Smokeless tobacco: Never Used  Substance and Sexual Activity  . Alcohol use: Not Currently  . Drug use: Yes    Types: Marijuana, Other-see comments    Comment: former drug abuse, cocaine about 5 years ago but denies IV drug abuse  . Sexual activity: Yes    Birth control/protection: None  Lifestyle  . Physical activity    Days per week: 0 days    Minutes per session: 0 min  . Stress: Rather much  Relationships  . Social Herbalist on phone: Three times a week    Gets together: Three times a week    Attends religious service: Never    Active member of club or organization: No    Attends meetings of clubs or organizations: Never    Relationship status: Never married  . Intimate partner violence    Fear of current or ex partner: No    Emotionally abused: No    Physically abused: No    Forced sexual activity: No  Other Topics Concern  . Not on file  Social History Narrative  . Not on file    FAMILY HISTORY: Family History  Problem Relation Age of Onset  . Drug abuse Mother        History   . Depression Mother   . Drug abuse Father   . Heart attack Father 37       H/o drug abuse  . Depression Sister   . Scoliosis Sister   . Depression Maternal Grandmother   . Diabetes Maternal Grandfather   . Anesthesia problems Neg Hx   . Hypotension Neg Hx   . Malignant hyperthermia Neg Hx   . Pseudochol deficiency Neg Hx   . Colon cancer Neg Hx     ALLERGIES:  is allergic to tramadol.  MEDICATIONS:  Current Outpatient Medications  Medication Sig Dispense Refill  . albuterol (VENTOLIN HFA) 108 (90 Base) MCG/ACT inhaler Inhale 1 puff into the lungs every 4 (four) hours as needed for wheezing or shortness of breath. 18 g 0  . HYDROcodone-homatropine (HYCODAN) 5-1.5 MG/5ML syrup Take 5 mLs by mouth every 6 (six) hours as needed. For cough (Patient not taking: Reported on 10/20/2017) 120 mL 0  . ibuprofen (ADVIL,MOTRIN) 200 MG  tablet Take 200 mg by mouth every 6 (six) hours as needed.    . naproxen (NAPROSYN) 500 MG tablet Take 1 tablet (500 mg total) by mouth 2 (two) times daily. 30 tablet 0  . predniSONE (DELTASONE) 20 MG tablet Take 2 tablets (40 mg total) by mouth daily. (Patient taking differently: Take 40 mg by mouth daily. Pt only taking as needed) 8 tablet 0  No current facility-administered medications for this visit.     REVIEW OF SYSTEMS:   Constitutional: Denies fevers, chills or abnormal night sweats Eyes: Denies blurriness of vision, double vision or watery eyes Ears, nose, mouth, throat, and face: Denies mucositis or sore throat Respiratory: Denies cough, dyspnea or wheezes Cardiovascular: Denies any palpitations.  Denies lower extremity swelling.  Positive for right-sided chest wall pain. Gastrointestinal:  Denies nausea, heartburn or change in bowel habits Skin: Denies abnormal skin rashes Lymphatics: Denies new lymphadenopathy or easy bruising Neurological: Positive for numbness in the fingers of the left hand. Behavioral/Psych: Mood is stable, no new changes  All other systems were reviewed with the patient and are negative.  PHYSICAL EXAMINATION: ECOG PERFORMANCE STATUS: 1 - Symptomatic but completely ambulatory  Vitals:   11/30/19 1320  BP: (!) 143/96  Pulse: (!) 124  Resp: 14  Temp: (!) 97.3 F (36.3 C)  SpO2: 100%   Filed Weights   11/30/19 1320  Weight: 196 lb 12.8 oz (89.3 kg)    GENERAL:alert, no distress and comfortable SKIN: skin color, texture, turgor are normal, no rashes or significant lesions EYES: normal, conjunctiva are pink and non-injected, sclera clear OROPHARYNX:no exudate, no erythema and lips, buccal mucosa, and tongue normal  NECK: supple, thyroid normal size, non-tender, without nodularity LYMPH:  no palpable lymphadenopathy in the cervical, axillary or inguinal LUNGS: clear to auscultation and percussion with normal breathing effort HEART: regular rate  & rhythm and no murmurs and no lower extremity edema ABDOMEN:abdomen soft, non-tender and normal bowel sounds Musculoskeletal:no cyanosis of digits and no clubbing  PSYCH: alert & oriented x 3 with fluent speech NEURO: no focal motor/sensory deficits  LABORATORY DATA:  I have reviewed the data as listed Lab Results  Component Value Date   WBC 5.4 11/20/2019   HGB 11.9 (L) 11/20/2019   HCT 38.0 11/20/2019   MCV 88.4 11/20/2019   PLT 392 11/20/2019     Chemistry      Component Value Date/Time   NA 134 (L) 11/20/2019 1224   K 3.9 11/20/2019 1224   CL 102 11/20/2019 1224   CO2 24 11/20/2019 1224   BUN 9 11/20/2019 1224   CREATININE 0.78 11/20/2019 1224      Component Value Date/Time   CALCIUM 8.8 (L) 11/20/2019 1224   ALKPHOS 90 11/20/2019 1357   AST 27 11/20/2019 1357   ALT 24 11/20/2019 1357   BILITOT 1.1 11/20/2019 1357       RADIOGRAPHIC STUDIES: I have personally reviewed the radiological images as listed and agreed with the findings in the report. Dg Chest 2 View  Result Date: 11/20/2019 CLINICAL DATA:  Chest pain and shortness of breath EXAM: CHEST - 2 VIEW COMPARISON:  June 15, 2017 FINDINGS: Lungs are clear. Heart size and pulmonary vascularity are normal. No adenopathy. No pneumothorax. No bone lesions. IMPRESSION: No edema or consolidation. Electronically Signed   By: Lowella Grip III M.D.   On: 11/20/2019 13:22   Ct Angio Chest Pe W/cm &/or Wo Cm  Result Date: 11/20/2019 CLINICAL DATA:  Shortness of breath, chest pain. Hemoptysis. EXAM: CT ANGIOGRAPHY CHEST WITH CONTRAST TECHNIQUE: Multidetector CT imaging of the chest was performed using the standard protocol during bolus administration of intravenous contrast. Multiplanar CT image reconstructions and MIPs were obtained to evaluate the vascular anatomy. CONTRAST:  15mL OMNIPAQUE IOHEXOL 350 MG/ML SOLN COMPARISON:  June 15, 2017. FINDINGS: Cardiovascular: Satisfactory opacification of the pulmonary arteries  to the segmental level. No evidence of pulmonary  embolism. Normal heart size. No pericardial effusion. Mediastinum/Nodes: Thyroid gland and esophagus are unremarkable. 34 x 23 mm lobulated anterior mediastinal mass is noted which is increased compared to prior exam. Lungs/Pleura: Lungs are clear. No pleural effusion or pneumothorax. Upper Abdomen: No acute abnormality. Musculoskeletal: No chest wall abnormality. No acute or significant osseous findings. Review of the MIP images confirms the above findings. IMPRESSION: 1. No definite evidence of pulmonary embolus. 2. 34 x 23 mm lobulated anterior mediastinal mass is noted which is increased compared to prior exam and concerning for neoplasm. 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 Electronically Signed   By: Marijo Conception M.D.   On: 11/20/2019 15:12    ASSESSMENT & PLAN:  Thymoma 1.  Anterior mediastinal mass: -She developed right anterior chest wall pain 2 days prior to the presentation to the ER on 11/20/2019. -CT chest PE protocol on 11/20/2019 shows no pulmonary embolus.  3.4 x 2.3 cm lobulated anterior mediastinal mass, increased compared to prior exam in 2018.  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 -She reports the pain as sharp lasting few seconds, worse on deep breathing and certain movements.  She has trouble sleeping at nighttime worrying about the mass.  Denies any fevers, night sweats or weight loss. -She is taking ibuprofen as needed for this pain. -I have recommended blood work including LDH, AFP, beta-hCG.  I have also recommended a PET scan.  2.  Recurrent pulmonary embolism: -She has been off of anticoagulation at this time.  Is not clear when she has been off of it. -She had pulmonary embolism in 2011 and 2018. -We will check lupus anticoagulant, anticardiolipin antibody and antibeta-2 glycoprotein antibody today.  Orders Placed This Encounter  Procedures  . NM PET Image Initial (PI) Skull  Base To Thigh    Standing Status:   Future    Standing Expiration Date:   11/29/2020    Order Specific Question:   ** REASON FOR EXAM (FREE TEXT)    Answer:   thymic mass    Order Specific Question:   If indicated for the ordered procedure, I authorize the administration of a radiopharmaceutical per Radiology protocol    Answer:   Yes    Order Specific Question:   Is the patient pregnant?    Answer:   No    Order Specific Question:   Preferred imaging location?    Answer:   [facility]    Order Specific Question:   Radiology Contrast Protocol - do NOT remove file path    Answer:   [email or web address]  . Lupus anticoagulant panel    Standing Status:   Future    Number of Occurrences:   1    Standing Expiration Date:   11/29/2020  . Cardiolipin antibodies, IgG, IgM, IgA    Standing Status:   Future    Number of Occurrences:   1    Standing Expiration Date:   11/29/2020  . Beta-2-glycoprotein i abs, IgG/M/A    Standing Status:   Future    Number of Occurrences:   1    Standing Expiration Date:   11/29/2020  . Lactate dehydrogenase    Standing Status:   Future    Number of Occurrences:   1    Standing Expiration Date:   11/29/2020  . Sedimentation rate    Standing Status:   Future    Number of Occurrences:   1    Standing Expiration Date:   11/29/2020  .  TSH    Standing Status:   Future    Number of Occurrences:   1    Standing Expiration Date:   11/29/2020  . C-reactive protein    Standing Status:   Future    Number of Occurrences:   1    Standing Expiration Date:   11/29/2020  . HCG, tumor marker    Standing Status:   Future    Number of Occurrences:   1    Standing Expiration Date:   11/29/2020  . AFP tumor marker    Standing Status:   Future    Number of Occurrences:   1    Standing Expiration Date:   11/29/2020    All questions were answered. The patient knows to call the clinic with any problems, questions or concerns.      Derek Jack,  MD 11/30/2019 6:56 PM

## 2019-11-30 NOTE — Patient Instructions (Addendum)
[place] at [facility] Discharge Instructions  You were seen today by Dr. Delton Coombes. He went over your history, family history and how you've been feeling lately. He will have lab work drawn today. He will schedule you for a PET scan. He will see you back after your scan for follow up.   Thank you for choosing [place] at [facility] to provide your oncology and hematology care.  To afford each patient quality time with our provider, please arrive at least 15 minutes before your scheduled appointment time.   If you have a lab appointment with the [facility] please come in thru the  Main Entrance and check in at the main information desk  You need to re-schedule your appointment should you arrive 10 or more minutes late.  We strive to give you quality time with our providers, and arriving late affects you and other patients whose appointments are after yours.  Also, if you no show three or more times for appointments you may be dismissed from the clinic at the providers discretion.     Again, thank you for choosing [facility].  Our hope is that these requests will decrease the amount of time that you wait before being seen by our physicians.       _____________________________________________________________  Should you have questions after your visit to [facility], please contact our office at [phone] between the hours of 8:00 a.m. and 4:30 p.m.  Voicemails left after 4:00 p.m. will not be returned until the following business day.  For prescription refill requests, have your pharmacy contact our office and allow 72 hours.    Cancer Center Support Programs:   > Cancer Support Group  2nd Tuesday of the month 1pm-2pm, Journey Room

## 2019-12-01 LAB — CARDIOLIPIN ANTIBODIES, IGG, IGM, IGA
Anticardiolipin IgA: <9 APL U/mL (ref 0–11)
Anticardiolipin IgG: <9 GPL U/mL (ref 0–14)
Anticardiolipin IgM: 10 MPL U/mL (ref 0–12)

## 2019-12-01 LAB — AFP TUMOR MARKER: AFP, Serum, Tumor Marker: 1.5 ng/mL (ref 0.0–8.3)

## 2019-12-01 LAB — BETA HCG QUANT (REF LAB): hCG Quant: <1 m[IU]/mL

## 2019-12-01 LAB — BETA-2-GLYCOPROTEIN I ABS, IGG/M/A
Beta-2 Glyco I IgG: <9 GPI IgG units (ref 0–20)
Beta-2-Glycoprotein I IgA: <9 GPI IgA units (ref 0–25)
Beta-2-Glycoprotein I IgM: <9 GPI IgM units (ref 0–32)

## 2019-12-01 LAB — LUPUS ANTICOAGULANT PANEL
DRVVT: 33.7 s (ref 0.0–47.0)
PTT Lupus Anticoagulant: 30 s (ref 0.0–51.9)

## 2019-12-18 ENCOUNTER — Ambulatory Visit (HOSPITAL_COMMUNITY): Payer: Self-pay

## 2019-12-19 ENCOUNTER — Ambulatory Visit (HOSPITAL_COMMUNITY): Payer: Self-pay | Admitting: Hematology

## 2020-01-15 ENCOUNTER — Ambulatory Visit (HOSPITAL_COMMUNITY)
Admission: RE | Admit: 2020-01-15 | Discharge: 2020-01-15 | Disposition: A | Discharge status: Home / Self Care | Payer: Self-pay | Source: Ambulatory Visit | Attending: Hematology | Admitting: Hematology

## 2020-01-15 ENCOUNTER — Other Ambulatory Visit: Payer: Self-pay

## 2020-01-15 DIAGNOSIS — D4989 Neoplasm of unspecified behavior of other specified sites: Secondary | ICD-10-CM | POA: Insufficient documentation

## 2020-01-15 MED ORDER — FLUDEOXYGLUCOSE F - 18 (FDG) INJECTION
11.8600 | Freq: Once | INTRAVENOUS | Status: AC | PRN
  Administered 2020-01-15: 11.86 via INTRAVENOUS

## 2020-01-17 ENCOUNTER — Encounter (HOSPITAL_COMMUNITY): Payer: Self-pay | Admitting: Hematology

## 2020-01-17 ENCOUNTER — Inpatient Hospital Stay (HOSPITAL_COMMUNITY): Payer: Self-pay | Attending: Hematology | Admitting: Hematology

## 2020-01-17 ENCOUNTER — Other Ambulatory Visit: Payer: Self-pay

## 2020-01-17 VITALS — BP 149/93 | HR 103 | Temp 97.1°F | Resp 14 | Wt 197.6 lb

## 2020-01-17 DIAGNOSIS — I2699 Other pulmonary embolism without acute cor pulmonale: Secondary | ICD-10-CM | POA: Insufficient documentation

## 2020-01-17 DIAGNOSIS — D4989 Neoplasm of unspecified behavior of other specified sites: Secondary | ICD-10-CM

## 2020-01-17 DIAGNOSIS — Z7901 Long term (current) use of anticoagulants: Secondary | ICD-10-CM | POA: Insufficient documentation

## 2020-01-17 DIAGNOSIS — R222 Localized swelling, mass and lump, trunk: Secondary | ICD-10-CM | POA: Insufficient documentation

## 2020-01-17 DIAGNOSIS — F17210 Nicotine dependence, cigarettes, uncomplicated: Secondary | ICD-10-CM | POA: Insufficient documentation

## 2020-01-17 NOTE — Patient Instructions (Addendum)
[place] at [facility] Discharge Instructions  You were seen today by Dr. Delton Coombes. He went over your recent lab and scan results. You have a thymoma which is not cancerous. He will continue to monitor this with scans every year. He will see you back in 1 year for labs and follow up.   Thank you for choosing [place] at [facility] to provide your oncology and hematology care.  To afford each patient quality time with our provider, please arrive at least 15 minutes before your scheduled appointment time.   If you have a lab appointment with the [facility] please come in thru the  Main Entrance and check in at the main information desk  You need to re-schedule your appointment should you arrive 10 or more minutes late.  We strive to give you quality time with our providers, and arriving late affects you and other patients whose appointments are after yours.  Also, if you no show three or more times for appointments you may be dismissed from the clinic at the providers discretion.     Again, thank you for choosing [facility].  Our hope is that these requests will decrease the amount of time that you wait before being seen by our physicians.       _____________________________________________________________  Should you have questions after your visit to [facility], please contact our office at [phone] between the hours of 8:00 a.m. and 4:30 p.m.  Voicemails left after 4:00 p.m. will not be returned until the following business day.  For prescription refill requests, have your pharmacy contact our office and allow 72 hours.    Cancer Center Support Programs:   > Cancer Support Group  2nd Tuesday of the month 1pm-2pm, Journey Room

## 2020-01-17 NOTE — Assessment & Plan Note (Signed)
1.  Anterior mediastinal mass: -She went to the ER with right chest wall pain.  CT chest PE protocol on 11/20/2019 showed no pulmonary embolus.  3.4 x 2.3 cm lobulated anterior mediastinal mass, slightly increased compared to prior exam in 2018. -We reviewed results of blood work including AFP, beta-hCG and LDH which were normal. -We reviewed results of the PET scan from 01/15/2020 which showed 1.2 x 3.6 cm anterior mediastinal mass with normal feathery texture of thymic tissue.  SUV 3.1.  Overall thymic hyperplasia is favored over a thymic neoplasm.  Healing right anterior fourth and fifth rib fractures. -As the appearance was similar dating back to 2012, it was strongly thought it was benign in nature.  I have recommended follow-up with MRI of the chest with contrast in a year.  2.  Recurrent pulmonary embolism: -She has been off of anticoagulation for 7 years. -She was reportedly diagnosed with pulmonary embolism in her teen years.  She was not sure whether she was on oral contraceptives at that time. -We checked her lupus anticoagulant, anticardiolipin antibodies and antibeta-2 glycoprotein one antibodies which are negative. -As she has been off of anticoagulation for the last 7 years, I did not recommend restarting it at this time.

## 2020-01-17 NOTE — Progress Notes (Signed)
[address] [place], [place] [address]   CLINIC:  Medical Oncology/Hematology  PCP:  Patient, No Pcp Per No address on file None   REASON FOR VISIT:  Follow-up for anterior mediastinal mass.  CURRENT THERAPY: Observation.   INTERVAL HISTORY:  [name] 29 y.o. female seen for follow-up of anterior mediastinal mass.  She is accompanied by her mother today.  Appetite and energy levels are 25 to 50%.  Has dry cough and shortness of breath with activity.  She reports smoking a lot of marijuana.  She also reports on and off nausea, vomiting.    REVIEW OF SYSTEMS:  Review of Systems  Respiratory: Positive for cough and shortness of breath.   Gastrointestinal: Positive for nausea and vomiting.  Psychiatric/Behavioral: Positive for sleep disturbance.  All other systems reviewed and are negative.    PAST MEDICAL/SURGICAL HISTORY:  Past Medical History:  Diagnosis Date  . Abnormal Pap smear   . Anemia   . Anticoagulant long-term use   . Antiphospholipid antibody with hypercoagulable state ([place])   . Anxiety   . Anxiety and depression    suicide attempt-age 50  . Asthma    ABG in 11/2010:7.45, 34, 79., ventolin rescue inhaler last use 03/21/12  . Attention deficit hyperactivity disorder   . Bipolar disorder ([place])    not on meds  . Cough productive of purulent sputum   . Depression    suicide attempt at age 41  . Dermatitis due to detergents   . Edema in pregnancy   . GERD (gastroesophageal reflux disease)   . Lupus anticoagulant disorder [facility])    Previously followed by Dr. Lieutenant Diego of discharged from his care due to medical noncompliance.  . Panic attacks    not on meds  . Pulmonary embolism ([place]) 06/2010   Multiple bilateral pulmonary emboli diagnosed by CT scan in in 06/2010.  Moderate size pleural effusion and right lower lobe infarction present at that time.  Lupus anticoagulant was present, but all other aspects of her  hypercoagulable evaluation were negative including all Cardiolite and antibodies.    . Recurrent upper respiratory infection (URI)   . Shortness of breath    has asthma- scheduled for PFTs on 04/06/12  . Substance abuse ([place])    cocaine, opiates, marijuana; alcohol; most recent positive test for cocaine was in December of 2011; alcohol levels have been as high as 302  . Tobacco abuse    Past Surgical History:  Procedure Laterality Date  . CESAREAN SECTION  2011, 2009  . CESAREAN SECTION  04/08/2012   Procedure: CESAREAN SECTION;  Surgeon: Donnamae Jude, MD;  Location: [facility];  Service: Gynecology;  Laterality: N/A;  . HEMANGIOMA EXCISION  2013   R wrist  . TUBAL LIGATION  04/08/2012   Procedure: BILATERAL TUBAL LIGATION;  Surgeon: Donnamae Jude, MD;  Location: [facility];  Service: Gynecology;  Laterality: Bilateral;  . WRIST SURGERY  2009   hemoangioma removed from right wrist     SOCIAL HISTORY:  Social History   Socioeconomic History  . Marital status: Single    Spouse name: Not on file  . Number of children: 3  . Years of education: Not on file  . Highest education level: Not on file  Occupational History  . Occupation: unemployed    Fish farm manager: UNEMPLOYED  Tobacco Use  . Smoking status: Current Every Day Smoker    Packs/day: 0.50    Years: 12.00  Pack years: 6.00    Types: Cigarettes  . Smokeless tobacco: Never Used  Substance and Sexual Activity  . Alcohol use: Not Currently  . Drug use: Yes    Types: Marijuana, Other-see comments    Comment: former drug abuse, cocaine about 5 years ago but denies IV drug abuse  . Sexual activity: Yes    Birth control/protection: None  Other Topics Concern  . Not on file  Social History Narrative  . Not on file   Social Determinants of Health   Financial Resource Strain: Low Risk   . Difficulty of Paying Living Expenses: Not hard at all  Food Insecurity: No Food Insecurity  . Worried About Charity fundraiser in the Last Year:  Never true  . Ran Out of Food in the Last Year: Never true  Transportation Needs: No Transportation Needs  . Lack of Transportation (Medical): No  . Lack of Transportation (Non-Medical): No  Physical Activity: Inactive  . Days of Exercise per Week: 0 days  . Minutes of Exercise per Session: 0 min  Stress: Stress Concern Present  . Feeling of Stress : Rather much  Social Connections: Moderately Isolated  . Frequency of Communication with Friends and Family: Three times a week  . Frequency of Social Gatherings with Friends and Family: Three times a week  . Attends Religious Services: Never  . Active Member of Clubs or Organizations: No  . Attends Archivist Meetings: Never  . Marital Status: Never married  Intimate Partner Violence: Not At Risk  . Fear of Current or Ex-Partner: No  . Emotionally Abused: No  . Physically Abused: No  . Sexually Abused: No    FAMILY HISTORY:  Family History  Problem Relation Age of Onset  . Drug abuse Mother        History   . Depression Mother   . Drug abuse Father   . Heart attack Father 66       H/o drug abuse  . Depression Sister   . Scoliosis Sister   . Depression Maternal Grandmother   . Diabetes Maternal Grandfather   . Anesthesia problems Neg Hx   . Hypotension Neg Hx   . Malignant hyperthermia Neg Hx   . Pseudochol deficiency Neg Hx   . Colon cancer Neg Hx     CURRENT MEDICATIONS:  Outpatient Encounter Medications as of 01/17/2020  Medication Sig  . albuterol (VENTOLIN HFA) 108 (90 Base) MCG/ACT inhaler Inhale 1 puff into the lungs every 4 (four) hours as needed for wheezing or shortness of breath.  Marland Kitchen HYDROcodone-homatropine (HYCODAN) 5-1.5 MG/5ML syrup Take 5 mLs by mouth every 6 (six) hours as needed. For cough (Patient not taking: Reported on 10/20/2017)  . ibuprofen (ADVIL,MOTRIN) 200 MG tablet Take 200 mg by mouth every 6 (six) hours as needed.  . naproxen (NAPROSYN) 500 MG tablet Take 1 tablet (500 mg total) by  mouth 2 (two) times daily. (Patient not taking: Reported on 01/17/2020)  . predniSONE (DELTASONE) 20 MG tablet Take 2 tablets (40 mg total) by mouth daily. (Patient not taking: Reported on 01/17/2020)  . [DISCONTINUED] enoxaparin (LOVENOX) 30 MG/0.3ML injection Inject 1.5 mLs (150 mg total) into the skin daily. (Patient not taking: Reported on 10/20/2017)  . [DISCONTINUED] warfarin (COUMADIN) 5 MG tablet Take 1 tablet (5 mg total) by mouth daily. (Patient not taking: Reported on 10/20/2017)   No facility-administered encounter medications on file as of 01/17/2020.    ALLERGIES:  Allergies  Allergen Reactions  .  Tramadol     hallucinations     PHYSICAL EXAM:  ECOG Performance status: 1  Vitals:   01/17/20 1204  BP: (!) 149/93  Pulse: (!) 103  Resp: 14  Temp: (!) 97.1 F (36.2 C)  SpO2: 100%   Filed Weights   01/17/20 1204  Weight: 197 lb 9.6 oz (89.6 kg)    Physical Exam Vitals reviewed.  Constitutional:      Appearance: Normal appearance.  Neurological:     General: No focal deficit present.     Mental Status: She is alert and oriented to person, place, and time.  Psychiatric:        Mood and Affect: Mood normal.        Behavior: Behavior normal.      LABORATORY DATA:  I have reviewed the labs as listed.  CBC    Component Value Date/Time   WBC 5.4 11/20/2019 1224   RBC 4.30 11/20/2019 1224   HGB 11.9 (L) 11/20/2019 1224   HCT 38.0 11/20/2019 1224   PLT 392 11/20/2019 1224   MCV 88.4 11/20/2019 1224   MCH 27.7 11/20/2019 1224   MCHC 31.3 11/20/2019 1224   RDW 15.5 11/20/2019 1224   LYMPHSABS 1.6 06/15/2017 0858   MONOABS 0.8 06/15/2017 0858   EOSABS 0.2 06/15/2017 0858   BASOSABS 0.0 06/15/2017 0858   CMP Latest Ref Rng & Units 11/20/2019 06/15/2017 01/19/2017  Glucose 70 - 99 mg/dL 101(H) 93 81  BUN 6 - 20 mg/dL 9 9 5(L)  Creatinine 0.44 - 1.00 mg/dL 0.78 0.73 1.00  Sodium 135 - 145 mmol/L 134(L) 137 145  Potassium 3.5 - 5.1 mmol/L 3.9 3.6 3.7    Chloride 98 - 111 mmol/L 102 102 108  CO2 22 - 32 mmol/L 24 25 -  Calcium 8.9 - 10.3 mg/dL 8.8(L) 8.1(L) -  Total Protein 6.5 - 8.1 g/dL 8.8(H) - -  Total Bilirubin 0.3 - 1.2 mg/dL 1.1 - -  Alkaline Phos 38 - 126 U/L 90 - -  AST 15 - 41 U/L 27 - -  ALT 0 - 44 U/L 24 - -       DIAGNOSTIC IMAGING:  I have independently reviewed the scans and discussed with the patient.     ASSESSMENT & PLAN:   Thymoma 1.  Anterior mediastinal mass: -She went to the ER with right chest wall pain.  CT chest PE protocol on 11/20/2019 showed no pulmonary embolus.  3.4 x 2.3 cm lobulated anterior mediastinal mass, slightly increased compared to prior exam in 2018. -We reviewed results of blood work including AFP, beta-hCG and LDH which were normal. -We reviewed results of the PET scan from 01/15/2020 which showed 1.2 x 3.6 cm anterior mediastinal mass with normal feathery texture of thymic tissue.  SUV 3.1.  Overall thymic hyperplasia is favored over a thymic neoplasm.  Healing right anterior fourth and fifth rib fractures. -As the appearance was similar dating back to 2012, it was strongly thought it was benign in nature.  I have recommended follow-up with MRI of the chest with contrast in a year.  2.  Recurrent pulmonary embolism: -She has been off of anticoagulation for 7 years. -She was reportedly diagnosed with pulmonary embolism in her teen years.  She was not sure whether she was on oral contraceptives at that time. -We checked her lupus anticoagulant, anticardiolipin antibodies and antibeta-2 glycoprotein one antibodies which are negative. -As she has been off of anticoagulation for the last 7 years, I did not  recommend restarting it at this time.      Orders placed this encounter:  Orders Placed This Encounter  Procedures  . MR Chest W Contrast  . CBC with Differential/Platelet  . Comprehensive metabolic panel      Derek Jack, MD [place] [phone]

## 2020-11-15 ENCOUNTER — Emergency Department (HOSPITAL_COMMUNITY): Payer: Self-pay

## 2020-11-15 ENCOUNTER — Encounter (HOSPITAL_COMMUNITY): Payer: Self-pay

## 2020-11-15 ENCOUNTER — Other Ambulatory Visit: Payer: Self-pay

## 2020-11-15 ENCOUNTER — Emergency Department (HOSPITAL_COMMUNITY)
Admission: EM | Admit: 2020-11-15 | Discharge: 2020-11-15 | Disposition: A | Discharge status: Home / Self Care | Payer: Self-pay | Attending: Emergency Medicine | Admitting: Emergency Medicine

## 2020-11-15 DIAGNOSIS — Z86711 Personal history of pulmonary embolism: Secondary | ICD-10-CM | POA: Insufficient documentation

## 2020-11-15 DIAGNOSIS — Z7901 Long term (current) use of anticoagulants: Secondary | ICD-10-CM | POA: Insufficient documentation

## 2020-11-15 DIAGNOSIS — F10129 Alcohol abuse with intoxication, unspecified: Secondary | ICD-10-CM | POA: Insufficient documentation

## 2020-11-15 DIAGNOSIS — F10920 Alcohol use, unspecified with intoxication, uncomplicated: Secondary | ICD-10-CM

## 2020-11-15 DIAGNOSIS — J45909 Unspecified asthma, uncomplicated: Secondary | ICD-10-CM | POA: Insufficient documentation

## 2020-11-15 DIAGNOSIS — Y908 Blood alcohol level of 240 mg/100 ml or more: Secondary | ICD-10-CM | POA: Insufficient documentation

## 2020-11-15 DIAGNOSIS — F17210 Nicotine dependence, cigarettes, uncomplicated: Secondary | ICD-10-CM | POA: Insufficient documentation

## 2020-11-15 LAB — RAPID URINE DRUG SCREEN, HOSP PERFORMED
Amphetamines: NOT DETECTED
Barbiturates: NOT DETECTED
Benzodiazepines: POSITIVE — AB
Cocaine: NOT DETECTED
Opiates: NOT DETECTED
Tetrahydrocannabinol: POSITIVE — AB

## 2020-11-15 LAB — URINALYSIS, ROUTINE W REFLEX MICROSCOPIC
Bilirubin Urine: NEGATIVE
Glucose, UA: NEGATIVE mg/dL
Hgb urine dipstick: NEGATIVE
Ketones, ur: NEGATIVE mg/dL
Leukocytes,Ua: NEGATIVE
Nitrite: NEGATIVE
Protein, ur: NEGATIVE mg/dL
Specific Gravity, Urine: 1.002 — ABNORMAL LOW (ref 1.005–1.030)
pH: 6 (ref 5.0–8.0)

## 2020-11-15 LAB — BASIC METABOLIC PANEL
Anion gap: 11 (ref 5–15)
BUN: 6 mg/dL (ref 6–20)
CO2: 24 mmol/L (ref 22–32)
Calcium: 8.7 mg/dL — ABNORMAL LOW (ref 8.9–10.3)
Chloride: 102 mmol/L (ref 98–111)
Creatinine, Ser: 0.79 mg/dL (ref 0.44–1.00)
GFR, Estimated: >60 mL/min (ref >60)
Glucose, Bld: 94 mg/dL (ref 70–99)
Potassium: 4.1 mmol/L (ref 3.5–5.1)
Sodium: 137 mmol/L (ref 135–145)

## 2020-11-15 LAB — ETHANOL: Alcohol, Ethyl (B): 341 mg/dL (ref <10)

## 2020-11-15 LAB — CBC
HCT: 37.2 % (ref 36.0–46.0)
Hemoglobin: 11.6 g/dL — ABNORMAL LOW (ref 12.0–15.0)
MCH: 26.4 pg (ref 26.0–34.0)
MCHC: 31.2 g/dL (ref 30.0–36.0)
MCV: 84.5 fL (ref 80.0–100.0)
Platelets: 615 10*3/uL — ABNORMAL HIGH (ref 150–400)
RBC: 4.4 MIL/uL (ref 3.87–5.11)
RDW: 19.9 % — ABNORMAL HIGH (ref 11.5–15.5)
WBC: 8.3 10*3/uL (ref 4.0–10.5)
nRBC: 0 % (ref 0.0–0.2)

## 2020-11-15 NOTE — ED Triage Notes (Signed)
Pt from home via [facility]. Paramedics called out for possible stroke. Upon arival pt had slurred speech only. Pt admitted to [facility] that she had been drinking today. Pt ambulatory from [facility] stretcher to ER stretcher.
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
## 2020-11-15 NOTE — ED Notes (Signed)
Date and time results received: 11/15/20 1646   Test: Ethanol Critical Value: 341  Name of Provider Notified: Cortni Couture PA  Orders Received? Or Actions Taken?: none yet

## 2020-11-15 NOTE — Discharge Instructions (Signed)
Please follow up with your primary care provider within 5-7 days for re-evaluation of your symptoms. If you do not have a primary care provider, information for a healthcare clinic has been provided for you to make arrangements for follow up care. Please return to the emergency department for any new or worsening symptoms. ° °

## 2020-11-16 ENCOUNTER — Encounter (HOSPITAL_COMMUNITY): Payer: Self-pay | Admitting: Emergency Medicine

## 2020-11-16 ENCOUNTER — Other Ambulatory Visit: Payer: Self-pay

## 2020-11-16 ENCOUNTER — Emergency Department (HOSPITAL_COMMUNITY)
Admission: EM | Admit: 2020-11-16 | Discharge: 2020-11-17 | Disposition: A | Discharge status: Home / Self Care | Payer: Self-pay | Attending: Emergency Medicine | Admitting: Emergency Medicine

## 2020-11-16 DIAGNOSIS — F10929 Alcohol use, unspecified with intoxication, unspecified: Secondary | ICD-10-CM | POA: Diagnosis present

## 2020-11-16 DIAGNOSIS — F10129 Alcohol abuse with intoxication, unspecified: Secondary | ICD-10-CM | POA: Diagnosis present

## 2020-11-16 DIAGNOSIS — F10920 Alcohol use, unspecified with intoxication, uncomplicated: Secondary | ICD-10-CM | POA: Insufficient documentation

## 2020-11-16 DIAGNOSIS — R45851 Suicidal ideations: Secondary | ICD-10-CM | POA: Insufficient documentation

## 2020-11-16 DIAGNOSIS — F32A Depression, unspecified: Secondary | ICD-10-CM | POA: Insufficient documentation

## 2020-11-16 DIAGNOSIS — F1010 Alcohol abuse, uncomplicated: Secondary | ICD-10-CM

## 2020-11-16 DIAGNOSIS — F17210 Nicotine dependence, cigarettes, uncomplicated: Secondary | ICD-10-CM | POA: Insufficient documentation

## 2020-11-16 DIAGNOSIS — F1910 Other psychoactive substance abuse, uncomplicated: Secondary | ICD-10-CM | POA: Diagnosis present

## 2020-11-16 LAB — CBC
HCT: 36.1 % (ref 36.0–46.0)
Hemoglobin: 11.6 g/dL — ABNORMAL LOW (ref 12.0–15.0)
MCH: 26.8 pg (ref 26.0–34.0)
MCHC: 32.1 g/dL (ref 30.0–36.0)
MCV: 83.4 fL (ref 80.0–100.0)
Platelets: 620 10*3/uL — ABNORMAL HIGH (ref 150–400)
RBC: 4.33 MIL/uL (ref 3.87–5.11)
RDW: 19.9 % — ABNORMAL HIGH (ref 11.5–15.5)
WBC: 6.7 10*3/uL (ref 4.0–10.5)
nRBC: 0 % (ref 0.0–0.2)

## 2020-11-16 LAB — LIPASE, BLOOD: Lipase: 30 U/L (ref 11–51)

## 2020-11-16 LAB — ACETAMINOPHEN LEVEL: Acetaminophen (Tylenol), Serum: <10 ug/mL — ABNORMAL LOW (ref 10–30)

## 2020-11-16 LAB — COMPREHENSIVE METABOLIC PANEL
ALT: 26 U/L (ref 0–44)
AST: 35 U/L (ref 15–41)
Albumin: 3.4 g/dL — ABNORMAL LOW (ref 3.5–5.0)
Alkaline Phosphatase: 99 U/L (ref 38–126)
Anion gap: 12 (ref 5–15)
BUN: 5 mg/dL — ABNORMAL LOW (ref 6–20)
CO2: 21 mmol/L — ABNORMAL LOW (ref 22–32)
Calcium: 8.3 mg/dL — ABNORMAL LOW (ref 8.9–10.3)
Chloride: 105 mmol/L (ref 98–111)
Creatinine, Ser: 0.73 mg/dL (ref 0.44–1.00)
GFR, Estimated: >60 mL/min (ref >60)
Glucose, Bld: 96 mg/dL (ref 70–99)
Potassium: 3.5 mmol/L (ref 3.5–5.1)
Sodium: 138 mmol/L (ref 135–145)
Total Bilirubin: 0.1 mg/dL — ABNORMAL LOW (ref 0.3–1.2)
Total Protein: 9.1 g/dL — ABNORMAL HIGH (ref 6.5–8.1)

## 2020-11-16 LAB — RAPID URINE DRUG SCREEN, HOSP PERFORMED
Amphetamines: NOT DETECTED
Barbiturates: NOT DETECTED
Benzodiazepines: POSITIVE — AB
Cocaine: NOT DETECTED
Opiates: NOT DETECTED
Tetrahydrocannabinol: POSITIVE — AB

## 2020-11-16 LAB — ETHANOL: Alcohol, Ethyl (B): 390 mg/dL (ref <10)

## 2020-11-16 LAB — POC URINE PREG, ED: Preg Test, Ur: NEGATIVE

## 2020-11-16 LAB — SALICYLATE LEVEL: Salicylate Lvl: <7 mg/dL — ABNORMAL LOW (ref 7.0–30.0)

## 2020-11-16 MED ORDER — LORAZEPAM 1 MG PO TABS
0.0000 mg | ORAL_TABLET | Freq: Four times a day (QID) | ORAL | Status: DC
  Administered 2020-11-16 – 2020-11-17 (×3): 2 mg via ORAL
  Administered 2020-11-17: 1 mg via ORAL
  Filled 2020-11-16: qty 2
  Filled 2020-11-16: qty 1
  Filled 2020-11-16 (×2): qty 2

## 2020-11-16 MED ORDER — LORAZEPAM 1 MG PO TABS: 0.0000 mg | ORAL_TABLET | Freq: Two times a day (BID) | ORAL | Status: DC

## 2020-11-16 MED ORDER — THIAMINE HCL 100 MG/ML IJ SOLN: 100.0000 mg | Freq: Every day | INTRAMUSCULAR | Status: DC

## 2020-11-16 MED ORDER — ONDANSETRON HCL 4 MG PO TABS
4.0000 mg | ORAL_TABLET | Freq: Three times a day (TID) | ORAL | Status: DC | PRN
  Administered 2020-11-16 – 2020-11-17 (×4): 4 mg via ORAL
  Filled 2020-11-16 (×4): qty 1

## 2020-11-16 MED ORDER — LORAZEPAM 2 MG/ML IJ SOLN: 0.0000 mg | Freq: Four times a day (QID) | INTRAMUSCULAR | Status: DC

## 2020-11-16 MED ORDER — THIAMINE HCL 100 MG PO TABS
100.0000 mg | ORAL_TABLET | Freq: Every day | ORAL | Status: DC
  Administered 2020-11-16 – 2020-11-17 (×2): 100 mg via ORAL
  Filled 2020-11-16 (×2): qty 1

## 2020-11-16 MED ORDER — LORAZEPAM 2 MG/ML IJ SOLN: 0.0000 mg | Freq: Two times a day (BID) | INTRAMUSCULAR | Status: DC

## 2020-11-16 MED ORDER — ACETAMINOPHEN 325 MG PO TABS
650.0000 mg | ORAL_TABLET | ORAL | Status: DC | PRN
  Administered 2020-11-17 (×2): 650 mg via ORAL
  Filled 2020-11-16 (×2): qty 2
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
## 2020-11-16 NOTE — ED Notes (Signed)
CRITICAL VALUE ALERT  Critical Value:  ETOH 390  Date & Time Notied:  11/16/2020 @ 6184  Provider Notified: Dr Rogene Houston  Orders Received/Actions taken:

## 2020-11-16 NOTE — ED Notes (Signed)
Pt awake at this time and RN able to perform CIWA Assessment

## 2020-11-16 NOTE — ED Notes (Signed)
IVC Papers faxed to Magistrate and then called to confirm.
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
## 2020-11-16 NOTE — ED Triage Notes (Signed)
Pt to ED via [employer] from home. [employer] states the mother called due to the patient's alcohol consumption. Patient's mother states the pt threatened suicide by train today.  Pt denies drinking alcohol today. Pt is hard to understand due to the fact that her words are extremely slurred.

## 2020-11-17 DIAGNOSIS — F10129 Alcohol abuse with intoxication, unspecified: Secondary | ICD-10-CM | POA: Diagnosis present

## 2020-11-17 MED ORDER — CHLORDIAZEPOXIDE HCL 25 MG PO CAPS
50.0000 mg | ORAL_CAPSULE | Freq: Once | ORAL | Status: AC
  Administered 2020-11-17: 50 mg via ORAL
  Filled 2020-11-17: qty 2

## 2020-11-17 MED ORDER — ALBUTEROL SULFATE (2.5 MG/3ML) 0.083% IN NEBU
INHALATION_SOLUTION | RESPIRATORY_TRACT | Status: AC
  Filled 2020-11-17: qty 6

## 2020-11-17 MED ORDER — CHLORDIAZEPOXIDE HCL 25 MG PO CAPS: ORAL_CAPSULE | ORAL | 0 refills | Status: AC

## 2020-11-17 MED ORDER — IPRATROPIUM-ALBUTEROL 0.5-2.5 (3) MG/3ML IN SOLN: 3.0000 mL | Freq: Four times a day (QID) | RESPIRATORY_TRACT | Status: DC | PRN

## 2020-11-17 MED ORDER — NICOTINE 21 MG/24HR TD PT24
21.0000 mg | MEDICATED_PATCH | Freq: Every day | TRANSDERMAL | Status: DC
  Administered 2020-11-17: 21 mg via TRANSDERMAL
  Filled 2020-11-17: qty 1

## 2020-11-17 MED ORDER — IPRATROPIUM BROMIDE 0.02 % IN SOLN
RESPIRATORY_TRACT | Status: AC
  Filled 2020-11-17: qty 2.5

## 2020-11-17 NOTE — ED Provider Notes (Signed)
Emergency Medicine Observation Re-evaluation Note  [name] is a 29 y.o. female, seen on rounds today.  Pt initially presented to the ED for complaints of Medical Clearance, Depression, and Alcohol Problem Currently (11/17/2020 11:50 AM), the patient is comfortable and calm.  Physical Exam  BP 103/76   Pulse (!) 109   Temp 99.1 F (37.3 C) (Oral)   Resp 18   LMP 10/16/2020 (Approximate)   SpO2 100%  Physical Exam General: Resting, alert Cardiac: Normal rate Lungs: Respirations Psych: Hypertensive, not responding to internal stimuli  ED Course / MDM  EKG:EKG Interpretation  Date/Time:  Saturday November 16 2020 16:07:06 EST Ventricular Rate:  81 PR Interval:  150 QRS Duration: 86 QT Interval:  386 QTC Calculation: 448 R Axis:   37 Text Interpretation: Normal sinus rhythm Normal ECG Confirmed by Fredia Sorrow [phone]) on 11/16/2020 4:30:44 PM    I have reviewed the labs performed to date as well as medications administered while in observation.  Recent changes in the last 24 hours include she has become sober and cooperative, and no longer suicidal.  Plan  Current plan is for continued observation and treatment by TTS. Patient is under full IVC at this time.   2:45 PM-seen and cleared by TTS team.  I reversed the IVC.  Patient has been given instructions by social work for treatments and facilities.  Patient is agreeable.   Daleen Bo, MD 11/18/20 1407
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
## 2020-11-17 NOTE — ED Notes (Signed)
Sitter notified nurse that she overheard mother talking to pt on the phone and told her that if she said she was going to kill herself, she would be put somewhere for sure. Pt stated after this " I am not going to kill myself" and hung up the phone.
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
## 2020-11-17 NOTE — ED Notes (Signed)
Pt has dry heaves. Dr. Eulis Foster notified of pt's symptoms but that according to the current Zofran order it is two hours early to give the next dose. Dr. Eulis Foster gave verbal order to go ahead and give another dose at this time.

## 2020-11-17 NOTE — Discharge Instructions (Signed)
It is important to stop drinking all forms of alcohol.  Try going to alcoholics anonymous to help with support.  The social work department will contact you tomorrow to help with ongoing management of your alcoholism.  We are giving you additional information, which is resources to help you avoid alcohol use.  We are giving you a prescription, to help control the symptoms of alcohol withdrawal.  Do not start drinking again.  You can do this if you try very hard.

## 2020-11-17 NOTE — ED Notes (Signed)
Patient was given lunch tray. currently  On computer with tts

## 2020-11-17 NOTE — B[facility] Counselor (Addendum)
Re-assessment:   Patient re-assessed this morning. Patient denied suicidal ideations stating, "the alcohol makes me think stupid." Patient denied homicidal ideations and denied auditory/visual hallucinations. Patient able to contract for safety. Reports she can go back to her mother house. Patient discussed following up with outpatient treatment getting back on buprenorphine and help with her drinking.   Disposition: pending

## 2020-11-17 NOTE — ED Notes (Signed)
TTS in progress 

## 2020-11-17 NOTE — ED Notes (Signed)
Pt noted to present with coughing and audible wheezing. RT Paged to come assess Pt at this time. Pt does have Hx of Asthma.

## 2020-11-19 NOTE — Clinical Social Work Note (Signed)
Referral made to [facility] for ongoing medical needs.    Ronnika Collett, Clydene Pugh, LCSW

## 2021-01-15 ENCOUNTER — Other Ambulatory Visit (HOSPITAL_COMMUNITY): Payer: Self-pay

## 2021-01-15 ENCOUNTER — Inpatient Hospital Stay (HOSPITAL_COMMUNITY): Payer: Self-pay | Attending: Hematology

## 2021-01-22 ENCOUNTER — Ambulatory Visit (HOSPITAL_COMMUNITY): Payer: Self-pay | Admitting: Hematology
